# Patient Record
Sex: Male | Born: 1960 | Race: White | Hispanic: No | Marital: Married | Smoking: Former smoker
Health system: Southern US, Community
[De-identification: ages and names within clinical notes are randomized; demographics above are authoritative.]

## PROBLEM LIST (undated history)

## (undated) DIAGNOSIS — I2510 Atherosclerotic heart disease of native coronary artery without angina pectoris: Secondary | ICD-10-CM

## (undated) DIAGNOSIS — G4733 Obstructive sleep apnea (adult) (pediatric): Secondary | ICD-10-CM

## (undated) DIAGNOSIS — M1990 Unspecified osteoarthritis, unspecified site: Secondary | ICD-10-CM

## (undated) DIAGNOSIS — K219 Gastro-esophageal reflux disease without esophagitis: Secondary | ICD-10-CM

## (undated) DIAGNOSIS — G4730 Sleep apnea, unspecified: Secondary | ICD-10-CM

## (undated) DIAGNOSIS — I319 Disease of pericardium, unspecified: Secondary | ICD-10-CM

## (undated) DIAGNOSIS — I10 Essential (primary) hypertension: Secondary | ICD-10-CM

## (undated) DIAGNOSIS — R011 Cardiac murmur, unspecified: Secondary | ICD-10-CM

## (undated) DIAGNOSIS — E785 Hyperlipidemia, unspecified: Secondary | ICD-10-CM

## (undated) HISTORY — PX: COLONOSCOPY: SHX174

## (undated) HISTORY — DX: Essential (primary) hypertension: I10

## (undated) HISTORY — DX: Obstructive sleep apnea (adult) (pediatric): G47.33

## (undated) HISTORY — PX: FINGER SURGERY: SHX640

## (undated) HISTORY — DX: Cardiac murmur, unspecified: R01.1

## (undated) HISTORY — DX: Atherosclerotic heart disease of native coronary artery without angina pectoris: I25.10

## (undated) HISTORY — DX: Unspecified osteoarthritis, unspecified site: M19.90

## (undated) HISTORY — PX: OTHER SURGICAL HISTORY: SHX169

## (undated) HISTORY — DX: Disease of pericardium, unspecified: I31.9

## (undated) HISTORY — DX: Hyperlipidemia, unspecified: E78.5

## (undated) HISTORY — DX: Sleep apnea, unspecified: G47.30

## (undated) HISTORY — PX: CARDIAC CATHETERIZATION: SHX172

---

## 2005-06-28 ENCOUNTER — Observation Stay (HOSPITAL_COMMUNITY): Admission: EM | Admit: 2005-06-28 | Discharge: 2005-06-29 | Payer: Self-pay | Admitting: Emergency Medicine

## 2009-10-09 ENCOUNTER — Inpatient Hospital Stay (HOSPITAL_COMMUNITY): Admission: AD | Admit: 2009-10-09 | Discharge: 2009-10-10 | Payer: Self-pay | Admitting: Cardiovascular Disease

## 2009-10-09 ENCOUNTER — Encounter: Payer: Self-pay | Admitting: Emergency Medicine

## 2009-10-09 ENCOUNTER — Ambulatory Visit: Payer: Self-pay | Admitting: Diagnostic Radiology

## 2009-10-24 ENCOUNTER — Encounter: Admission: RE | Admit: 2009-10-24 | Discharge: 2009-10-24 | Payer: Self-pay | Admitting: Orthopedic Surgery

## 2010-04-15 HISTORY — PX: UPPER GASTROINTESTINAL ENDOSCOPY: SHX188

## 2010-05-06 ENCOUNTER — Encounter: Payer: Self-pay | Admitting: Orthopedic Surgery

## 2010-06-15 DIAGNOSIS — G4733 Obstructive sleep apnea (adult) (pediatric): Secondary | ICD-10-CM | POA: Insufficient documentation

## 2010-06-15 DIAGNOSIS — I10 Essential (primary) hypertension: Secondary | ICD-10-CM | POA: Insufficient documentation

## 2010-06-15 DIAGNOSIS — I2510 Atherosclerotic heart disease of native coronary artery without angina pectoris: Secondary | ICD-10-CM | POA: Insufficient documentation

## 2010-06-15 DIAGNOSIS — E785 Hyperlipidemia, unspecified: Secondary | ICD-10-CM | POA: Insufficient documentation

## 2010-06-15 LAB — CBC AND DIFFERENTIAL
HCT: 42 % (ref 41–53)
Hemoglobin: 14.2 g/dL (ref 13.5–17.5)

## 2010-06-15 LAB — HEPATIC FUNCTION PANEL: Alkaline Phosphatase: 78 U/L (ref 25–125)

## 2010-06-15 LAB — BASIC METABOLIC PANEL
Glucose: 77 mg/dL
Potassium: 4.5 mmol/L (ref 3.4–5.3)
Sodium: 139 mmol/L (ref 137–147)

## 2010-06-15 LAB — LIPID PANEL: Cholesterol: 198 mg/dL (ref 0–200)

## 2010-06-15 LAB — TSH: TSH: 1.26 u[IU]/mL (ref <=5.90)

## 2010-06-21 ENCOUNTER — Encounter: Payer: Self-pay | Admitting: Gastroenterology

## 2010-06-26 NOTE — Letter (Signed)
Summary: New Patient letter  [facility]  [address] [place], [place] [address]   Phone: [phone]  Fax: [phone]       06/21/2010 MRN: [id number]  Gregg Morgan [address] [place], [place]  [address]  Dear Gregg Morgan,  Welcome to the Gastroenterology Division at [facility].    You are scheduled to see Dr.  Arlyce Dice on 07-30-10 at 3:30pm on the 3rd floor at [facility], [address]  We ask that you try to arrive at our office 15 minutes prior to your appointment time to allow for check-in.  We would like you to complete the enclosed self-administered evaluation form prior to your visit and bring it with you on the day of your appointment.  We will review it with you.  Also, please bring a complete list of all your medications or, if you prefer, bring the medication bottles and we will list them.  Please bring your insurance card so that we may make a copy of it.  If your insurance requires a referral to see a specialist, please bring your referral form from your primary care physician.  Co-payments are due at the time of your visit and may be paid by cash, check or credit card.     Your office visit will consist of a consult with your physician (includes a physical exam), any laboratory testing he/she may order, scheduling of any necessary diagnostic testing (e.g. x-ray, ultrasound, CT-scan), and scheduling of a procedure (e.g. Endoscopy, Colonoscopy) if required.  Please allow enough time on your schedule to allow for any/all of these possibilities.    If you cannot keep your appointment, please call [phone] to cancel or reschedule prior to your appointment date.  This allows [place] the opportunity to schedule an appointment for another patient in need of care.  If you do not cancel or reschedule by 5 p.m. the business day prior to your appointment date, you will be charged a $50.00 late cancellation/no-show fee.    Thank you for choosing  [facility] for your medical needs.  We appreciate the opportunity to care for you.  Please visit [place] at our website  to learn more about our practice.                     Sincerely,                                                             The Gastroenterology Division

## 2010-07-01 LAB — CBC
MCH: 30.8 pg (ref 26.0–34.0)
MCH: 31.7 pg (ref 26.0–34.0)
MCHC: 33.7 g/dL (ref 30.0–36.0)
MCV: 91.5 fL (ref 78.0–100.0)
Platelets: 166 10*3/uL (ref 150–400)
Platelets: 212 10*3/uL (ref 150–400)
RBC: 4.63 MIL/uL (ref 4.22–5.81)
RDW: 12.9 % (ref 11.5–15.5)
WBC: 11.9 10*3/uL — ABNORMAL HIGH (ref 4.0–10.5)
WBC: 7.1 10*3/uL (ref 4.0–10.5)

## 2010-07-01 LAB — BASIC METABOLIC PANEL
CO2: 29 mEq/L (ref 19–32)
Calcium: 8.8 mg/dL (ref 8.4–10.5)
Chloride: 104 mEq/L (ref 96–112)
Creatinine, Ser: 0.97 mg/dL (ref 0.4–1.5)
Glucose, Bld: 97 mg/dL (ref 70–99)
Potassium: 4.2 mEq/L (ref 3.5–5.1)
Sodium: 140 mEq/L (ref 135–145)

## 2010-07-01 LAB — LIPID PANEL
Cholesterol: 293 mg/dL — ABNORMAL HIGH (ref 0–200)
LDL Cholesterol: 221 mg/dL — ABNORMAL HIGH (ref 0–99)
Total CHOL/HDL Ratio: 5.2 RATIO
Triglycerides: 82 mg/dL (ref <150)
VLDL: 16 mg/dL (ref 0–40)

## 2010-07-01 LAB — COMPREHENSIVE METABOLIC PANEL
Alkaline Phosphatase: 100 U/L (ref 39–117)
CO2: 26 mEq/L (ref 19–32)
Calcium: 9.2 mg/dL (ref 8.4–10.5)
Chloride: 105 mEq/L (ref 96–112)
Glucose, Bld: 127 mg/dL — ABNORMAL HIGH (ref 70–99)
Potassium: 3.8 mEq/L (ref 3.5–5.1)

## 2010-07-01 LAB — DIFFERENTIAL
Basophils Absolute: 0 10*3/uL (ref 0.0–0.1)
Basophils Relative: 0 % (ref 0–1)
Eosinophils Absolute: 0.1 10*3/uL (ref 0.0–0.7)
Eosinophils Relative: 1 % (ref 0–5)
Monocytes Absolute: 1.1 10*3/uL — ABNORMAL HIGH (ref 0.1–1.0)
Monocytes Relative: 9 % (ref 3–12)
Neutrophils Relative %: 78 % — ABNORMAL HIGH (ref 43–77)

## 2010-07-01 LAB — CARDIAC PANEL(CRET KIN+CKTOT+MB+TROPI)
CK, MB: 0.7 ng/mL (ref 0.3–4.0)
Total CK: 58 U/L (ref 7–232)
Troponin I: 0.03 ng/mL (ref 0.00–0.06)

## 2010-07-01 LAB — URINALYSIS, ROUTINE W REFLEX MICROSCOPIC
Bilirubin Urine: NEGATIVE
Glucose, UA: NEGATIVE mg/dL
Ketones, ur: NEGATIVE mg/dL
Nitrite: NEGATIVE
Nitrite: NEGATIVE
Protein, ur: NEGATIVE mg/dL
Protein, ur: NEGATIVE mg/dL
Specific Gravity, Urine: 1.017 (ref 1.005–1.030)
Specific Gravity, Urine: 1.023 (ref 1.005–1.030)
Urobilinogen, UA: 0.2 mg/dL (ref 0.0–1.0)

## 2010-07-01 LAB — HEPARIN LEVEL (UNFRACTIONATED): Heparin Unfractionated: 0.33 IU/mL (ref 0.30–0.70)

## 2010-07-01 LAB — GLUCOSE, CAPILLARY: Glucose-Capillary: 99 mg/dL (ref 70–99)

## 2010-07-01 LAB — POCT CARDIAC MARKERS: Myoglobin, poc: 42.8 ng/mL (ref 12–200)

## 2010-07-01 LAB — TSH: TSH: 1.677 u[IU]/mL (ref 0.350–4.500)

## 2010-07-01 LAB — D-DIMER, QUANTITATIVE: D-Dimer, Quant: <0.22 ug/mL-FEU (ref 0.00–0.48)

## 2010-07-01 LAB — MAGNESIUM: Magnesium: 1.8 mg/dL (ref 1.5–2.5)

## 2010-07-01 LAB — MRSA PCR SCREENING: MRSA by PCR: POSITIVE — AB

## 2010-07-01 LAB — PROTIME-INR: Prothrombin Time: 13.1 seconds (ref 11.6–15.2)

## 2010-07-01 LAB — HEMOGLOBIN A1C: Mean Plasma Glucose: 114 mg/dL (ref <117)

## 2010-07-03 DIAGNOSIS — I2510 Atherosclerotic heart disease of native coronary artery without angina pectoris: Secondary | ICD-10-CM

## 2010-07-03 DIAGNOSIS — I10 Essential (primary) hypertension: Secondary | ICD-10-CM

## 2010-07-03 DIAGNOSIS — G4733 Obstructive sleep apnea (adult) (pediatric): Secondary | ICD-10-CM

## 2010-07-03 DIAGNOSIS — E785 Hyperlipidemia, unspecified: Secondary | ICD-10-CM

## 2010-07-30 ENCOUNTER — Ambulatory Visit (INDEPENDENT_AMBULATORY_CARE_PROVIDER_SITE_OTHER): Payer: BC Managed Care – PPO | Admitting: Gastroenterology

## 2010-07-30 ENCOUNTER — Encounter: Payer: Self-pay | Admitting: Gastroenterology

## 2010-07-30 VITALS — BP 132/76 | HR 84 | Ht 69.0 in | Wt 187.0 lb

## 2010-07-30 DIAGNOSIS — R195 Other fecal abnormalities: Secondary | ICD-10-CM | POA: Insufficient documentation

## 2010-07-30 NOTE — Assessment & Plan Note (Addendum)
Occult GI bleeding could be related to active peptic disease. Patient did have reflux symptoms and dysphagia raising the question of an esophageal stricture. The lower colonic or small bowel bleeding source is also a consideration.  Medications #1 review prior CBC. #2 upper endoscopy with dilatation as indicated. #3 if endoscopy is negative for any GI bleeding source, then I will obtain followup Hemoccults

## 2010-07-30 NOTE — Patient Instructions (Addendum)
Cc. Donal Moore,MD Your EGD is scheduled on 08/16/2010 at 4pm Upper GI Endoscopy Upper GI endoscopy means using a flexible scope to look at the esophagus, stomach and upper small bowel. This is done to make a diagnosis in people with heartburn, abdominal pain, or abnormal bleeding. Sometimes an endoscope is needed to remove foreign bodies or food that become stuck in the esophagus; it can also be used to take biopsy samples. For the best results, do not eat or drink for 8 hours before having your upper endoscopy.  To perform the endoscopy, you will probably be sedated and your throat will be numbed with a special spray. The endoscope is then slowly passed down your throat (this will not interfere with your breathing). An endoscopy exam takes 15-30 minutes to complete and there is no real pain. Patients rarely remember much about the procedure. The results of the test may take several days if a biopsy or other test is taken.  You may have a sore throat after an endoscopy exam. Serious complications are very rare. Stick to liquids and soft foods until your pain is better. You should not drive a car or operate any dangerous equipment for at least 24 hours after being sedated. SEEK IMMEDIATE MEDICAL CARE IF:  You have severe throat pain.   You have shortness of breath.   You have bleeding problems.   You have a fever.   You have difficulty recovering from your sedation.  Document Released: 05/09/2004 Document Re-Released: 06/26/2009 [facility] Patient Information 2011 [place], [place].

## 2010-07-30 NOTE — Progress Notes (Signed)
History of Present Illness:  Mr. Zentner is a pleasant, 50 year old white male referred at the request of Dr. Modesto Charon for evaluation of Hemoccult-positive stool. This was noted on routine testing. He has no GI complaints, including change of bowel habits, melena, or hematochezia. Until last month he was complaining of pyrosis and dysphagia to solids. He would wake up at night coughing and regurgitating gastric contents. All the symptoms resolved after starting omeprazole. He is on no gastric irritants, including nonsteroidals.  He apparently underwent colonoscopy about 5 years ago. That was negative.  The patient has sleep apnea and coronary artery disease.    Review of Systems: Pertinent positive and negative review of systems were noted in the above HPI section. All other review of systems were otherwise negative.    Current Medications, Allergies, Past Medical History, Past Surgical History, Family History and Social History were reviewed in [employer] electronic medical record  Vital signs were reviewed in today's medical record. Physical Exam: General: Well developed , well nourished, no acute distress Head: Normocephalic and atraumatic Eyes:  sclerae anicteric, EOMI Ears: Normal auditory acuity Mouth: No deformity or lesions Lungs: Clear throughout to auscultation Heart: Regular rate and rhythm; no murmurs, rubs or bruits Abdomen: Soft, non tender and non distended. No masses, hepatosplenomegaly or hernias noted. Normal Bowel sounds Rectal:deferred Musculoskeletal: Symmetrical with no gross deformities  Pulses:  Normal pulses noted Extremities: No clubbing, cyanosis, edema or deformities noted Neurological: Alert oriented x 4, grossly nonfocal Psychological:  Alert and cooperative. Normal mood and affect

## 2010-07-31 ENCOUNTER — Encounter: Payer: Self-pay | Admitting: Gastroenterology

## 2010-08-15 ENCOUNTER — Encounter: Payer: Self-pay | Admitting: Gastroenterology

## 2010-08-16 ENCOUNTER — Encounter: Payer: Self-pay | Admitting: Gastroenterology

## 2010-08-16 ENCOUNTER — Ambulatory Visit (AMBULATORY_SURGERY_CENTER): Payer: BC Managed Care – PPO | Admitting: Gastroenterology

## 2010-08-16 DIAGNOSIS — K921 Melena: Secondary | ICD-10-CM

## 2010-08-16 DIAGNOSIS — K222 Esophageal obstruction: Secondary | ICD-10-CM

## 2010-08-16 DIAGNOSIS — R195 Other fecal abnormalities: Secondary | ICD-10-CM

## 2010-08-16 DIAGNOSIS — K449 Diaphragmatic hernia without obstruction or gangrene: Secondary | ICD-10-CM

## 2010-08-16 MED ORDER — SODIUM CHLORIDE 0.9 % IV SOLN: 500.0000 mL | INTRAVENOUS | Status: DC

## 2010-08-16 NOTE — Patient Instructions (Signed)
DISCHARGE INSTRUCTIONS FOR ESOPHAGEAL DILATATION DIET FOR TODAY REVIEWED WITH PT & CAREPARTNER . INFORMATION ON HIATAL HERNIA ALSO GIVEN TO PT & CAREPARTNER.

## 2010-08-17 ENCOUNTER — Telehealth: Payer: Self-pay | Admitting: *Deleted

## 2010-08-17 NOTE — Telephone Encounter (Signed)

## 2010-08-31 NOTE — Op Note (Signed)
NAMELEANARD, DIMAIO NO.:  [id number]   MEDICAL RECORD NO.:  [id number]          PATIENT TYPE:  EMS   LOCATION:  ED                           FACILITY:  [facility]   PHYSICIAN:  Dionne Ano. Gramig III, M.D.DATE OF BIRTH:  02/17/1961   DATE OF PROCEDURE:  06/28/2005  DATE OF DISCHARGE:                                 OPERATIVE REPORT   PREOPERATIVE DIAGNOSIS:  Left index finger amputation.   POSTOPERATIVE DIAGNOSIS:  Left index finger amputation.   PROCEDURE:  1.  I&D open fracture left index finger about an amputation site including      the skin and subcutaneous tissue, bone and tendinous structures.  2.  Bilateral neurectomies, left index finger.  3.  Volar advancement flap left index finger.   SURGEON:  Dionne Ano. Amanda Pea, M.D.   ASSISTANT:  Karie Chimera, P.A.-C.   COMPLICATIONS:  None.   ANESTHESIA:  Intermetacarpal block with IV sedation.   TOURNIQUET TIME:  Zero.   COMPLICATIONS:  None.   ESTIMATED BLOOD LOSS:  Minimal.   INDICATIONS FOR PROCEDURE:  Mr. Sirius Woodford presents for evaluation and  treatment of the above mentioned injury. The patient was at work today and  sustained an on the job injury. The patient had his left index finger caught  in the machine and sustained the amputation. The amputation distally is  unsuitable for replantation and I have discussed this with the patient. He  has been prepared with I&D, revision, amputation, volar flap, neurectomies,  etc. He understands the risks and benefits and desires to proceed.   DESCRIPTION OF PROCEDURE:  The patient was seen by myself and anesthesia and  taken to the operative suite, underwent smooth induction of IV sedation  followed by intermetacarpal block performed by myself. He was then prepped  and draped in the usual sterile fashion with Betadine scrub and paint.  Following securing a sterile field, he underwent I&D of skin and  subcutaneous tissue, tendon, bone, and nail bed  tissue. Excisional  debridement of these structures was accomplished without difficulty removing  debris and charred material about the tip. I debrided him to a fresh base  and following this, I performed I&D with greater than 3 liters of water.  Once this was done, new drapes were placed and the patient then underwent  bilateral neurectomies about the left index finger followed by a volar  advancement flap pulling the volar skin up and suturing it to the nail bed.  The flap had a good refill. He did sustain a significant amount of trauma  and thus will allow the distal volar flap to declare itself but certainly at  the time of inset the patient appeared to be stable and viable. I trimmed  the dog ears medially and laterally for shaping purposes and secured them  with Chromic. Following this, he was irrigated copiously once again followed  by placement of Adaptic under the eponychial fold, sterile dressing and a  splint. He tolerated the procedure well. There were no complicating  features. All sponge, needle and instrument counts were reported as correct.  Following  this, the patient then underwent drape removal and was taken to  the recovery room where he will be started on IV antibiotics, pain  management according to his needs and will watch him closely in the  postoperative period. I have discussed with him the do's and don't's and  etc. and all questions have been encouraged and answered. It was a pleasure  to participate in his care and we look forward to participating in his  postoperative recovery.           ______________________________  Dionne Ano. Everlene Other, M.D.     Nash Mantis  D:  06/28/2005  T:  07/01/2005  Job:  [id number]

## 2011-08-01 ENCOUNTER — Encounter (HOSPITAL_COMMUNITY): Payer: Self-pay

## 2011-08-01 ENCOUNTER — Encounter (HOSPITAL_COMMUNITY)
Admission: RE | Admit: 2011-08-01 | Discharge: 2011-08-01 | Disposition: A | Discharge status: Home / Self Care | Payer: BC Managed Care – PPO | Source: Ambulatory Visit | Attending: Anesthesiology | Admitting: Anesthesiology

## 2011-08-01 ENCOUNTER — Encounter (HOSPITAL_COMMUNITY)
Admission: RE | Admit: 2011-08-01 | Discharge: 2011-08-01 | Disposition: A | Discharge status: Home / Self Care | Payer: BC Managed Care – PPO | Source: Ambulatory Visit | Attending: Orthopedic Surgery | Admitting: Orthopedic Surgery

## 2011-08-01 HISTORY — DX: Gastro-esophageal reflux disease without esophagitis: K21.9

## 2011-08-01 LAB — CBC
HCT: 41.9 % (ref 39.0–52.0)
MCH: 31.7 pg (ref 26.0–34.0)
MCHC: 35.1 g/dL (ref 30.0–36.0)
MCV: 90.3 fL (ref 78.0–100.0)
Platelets: 224 10*3/uL (ref 150–400)
RDW: 12.5 % (ref 11.5–15.5)
WBC: 5.5 10*3/uL (ref 4.0–10.5)

## 2011-08-01 LAB — BASIC METABOLIC PANEL
BUN: 12 mg/dL (ref 6–23)
CO2: 28 mEq/L (ref 19–32)
Calcium: 9.5 mg/dL (ref 8.4–10.5)
Creatinine, Ser: 0.91 mg/dL (ref 0.50–1.35)
Glucose, Bld: 114 mg/dL — ABNORMAL HIGH (ref 70–99)

## 2011-08-01 LAB — SURGICAL PCR SCREEN: Staphylococcus aureus: POSITIVE — AB

## 2011-08-01 NOTE — Progress Notes (Addendum)
Thursday...Marland KitchenMarland KitchenMarland Kitchen PATIENT WAS HAVING SOME CHEST DISCOMFORT, 6./2011, WHILE AT WORK, WENT HOME AND WIFE TOOK HIM TO EMERGENCY CARE, WHO THEN SENT HIM TO [place]...WENT FOR CARDIAC CATH---HAD 50-60% BLOCKAGE.Marland KitchenMEDICAL TREATMENT ONLY...  HAVE REQUESTED FROM [facility], HIS LAST OFFICE NOTE, EKG, SLEEP  STUDY...NO RECENT CHEST PAINS OR PROBLEMS SINCE HIS CATH IN 2011   NO ORDERS IN EPIC AS OF 1630 THURSDAY

## 2011-08-01 NOTE — Pre-Procedure Instructions (Signed)
20 Jemari Hallum Pelto   08/01/2011   Your procedure is scheduled on:  Sunday, April 20TH  Report to [facility] at  6:00 AM.  Call this number if you have problems the morning of surgery: [phone]   Remember:   Do not eat food:After Midnight  Friday   May have clear liquids: up to 4 Hours before arrival time  4:00 AM.  Clear liquids include soda, tea, black coffee, apple or grape juice, broth.   Take these medicines the morning of surgery with A SIP OF WATER: omeprazole   Do not wear jewelry, make-up or nail polish.   Do not wear lotions, powders, or perfumes. You may wear deodorant.  Do not shave 48 hours prior to surgery.   Do not bring valuables to the hospital.  Contacts, dentures or bridgework may not be worn into surgery.  Leave suitcase in the car. After surgery it may be brought to your room.  For patients admitted to the hospital, checkout time is 11:00 AM the day of discharge.   Patients discharged the day of surgery will not be allowed to drive home.  Name and phone number of your driver: MICHELLE    Special Instructions: CHG Shower Use Special Wash: 1/2 bottle night before surgery and 1/2 bottle morning of surgery.   Please read over the following fact sheets that you were given: Pain Booklet, MRSA Information and Surgical Site Infection Prevention

## 2011-08-01 NOTE — Progress Notes (Signed)
1530  Thursday....the patient, BACK IN 2011 HAD SOME CHEST PRESSURE (ALL DURING THE DAY--WORKED) WIFE BROUGHT HIM TO EMERGENCY FACILITIES IN [place]....WAS TRANSPORTED HIM FROM THERE TO [place]....WAS SEEN BY DR Erlene Quan...HAD CARDIAC CATH...WHICH SHOWED A 50-60% BLOCKAGE.Marland KitchenNO INTERVENTION..WAS MEDICAL TREATMENT ONLY....

## 2011-08-02 NOTE — Progress Notes (Signed)
EKG AND OFFICE NOTE SHOWN TO ALLISON ZELENAK PA , REQUESTED STRESS TEST AND ECHO FROM [facility].

## 2011-08-02 NOTE — Progress Notes (Addendum)
Faxed request to [facility] medical records with urgent on fax form requesting sleep study, EKG and office note. Received confirmation fax went through. Had spoken with medical records and they requested we fax request.   Called Dr. Glenna Durand office to let them know no orders in EPIC. They stated they would let him know.

## 2011-08-02 NOTE — H&P (Signed)
Gregg Morgan is an 51 y.o. male.   Chief Complaint: Right thumb fracture  HPI: Pt sustained closed fracture to thumb Pt seen/examined in office. Pt recommended to undergo surgery to correct joint alignment of thumb   Past Medical History  Diagnosis Date  . Hypertension   . Hyperlipidemia   . Coronary artery disease   . Obstructive sleep apnea   . GERD (gastroesophageal reflux disease)     Past Surgical History  Procedure Date  . None   . Finger surgery   . Cardiac catheterization     09/2009--DR  BERRY    Family History  Problem Relation Age of Onset  . Colon cancer Father    Social History:  reports that he quit smoking about 22 months ago. He has never used smokeless tobacco. He reports that he drinks about 1.2 ounces of alcohol per week. He reports that he does not use illicit drugs.  Allergies: No Known Allergies  Medications Prior to Admission  Medication Dose Route Frequency Provider Last Rate Last Dose  . 0.9 %  sodium chloride infusion  500 mL Intravenous Continuous Louis Meckel, MD       Medications Prior to Admission  Medication Sig Dispense Refill  . aspirin 81 MG tablet Take 81 mg by mouth daily.        Marland Kitchen atorvastatin (LIPITOR) 40 MG tablet Take 1-1/2 pills per day       . Cholecalciferol (VITAMIN D) 2000 UNITS CAPS Take by mouth 1 dose over 46 hours.        . fish oil-omega-3 fatty acids 1000 MG capsule Take 1,000 mg by mouth daily.        Marland Kitchen lisinopril (PRINIVIL,ZESTRIL) 20 MG tablet Take 20 mg by mouth daily.        Marland Kitchen omeprazole (PRILOSEC) 20 MG capsule Take 20 mg by mouth daily.          Results for orders placed during the hospital encounter of 08/01/11 (from the past 48 hour(s))  SURGICAL PCR SCREEN     Status: Abnormal   Collection Time   08/01/11  4:04 PM      Component Value Range Comment   MRSA, PCR POSITIVE (*) NEGATIVE     Staphylococcus aureus POSITIVE (*) NEGATIVE    BASIC METABOLIC PANEL     Status: Abnormal   Collection Time   08/01/11  4:05 PM      Component Value Range Comment   Sodium 138  135 - 145 (mEq/L)    Potassium 4.6  3.5 - 5.1 (mEq/L)    Chloride 103  96 - 112 (mEq/L)    CO2 28  19 - 32 (mEq/L)    Glucose, Bld 114 (*) 70 - 99 (mg/dL)    BUN 12  6 - 23 (mg/dL)    Creatinine, Ser 1.61  0.50 - 1.35 (mg/dL)    Calcium 9.5  8.4 - 10.5 (mg/dL)    GFR calc non Af Amer >90  >90 (mL/min)    GFR calc Af Amer >90  >90 (mL/min)   CBC     Status: Normal   Collection Time   08/01/11  4:05 PM      Component Value Range Comment   WBC 5.5  4.0 - 10.5 (K/uL)    RBC 4.64  4.22 - 5.81 (MIL/uL)    Hemoglobin 14.7  13.0 - 17.0 (g/dL)    HCT 09.6  04.5 - 40.9 (%)    MCV 90.3  78.0 -  100.0 (fL)    MCH 31.7  26.0 - 34.0 (pg)    MCHC 35.1  30.0 - 36.0 (g/dL)    RDW 16.1  09.6 - 04.5 (%)    Platelets 224  150 - 400 (K/uL)    Dg Chest 2 View  08/01/2011  *RADIOLOGY REPORT*  Clinical Data: Preop for right thumb surgery  CHEST - 2 VIEW  Comparison: 10/09/2009  Findings: Cardiomediastinal silhouette is stable.  No acute infiltrate or pleural effusion.  No pulmonary edema.  Bony thorax is stable.  IMPRESSION: No active disease.  No significant change.  Original Report Authenticated By: Natasha Mead, M.D.    No recent illnesses or hospitalizations  General Appearance:  Alert, cooperative, no distress, appears stated age  Head:  Normocephalic, without obvious abnormality, atraumatic  Eyes:  Pupils equal, conjunctiva/corneas clear,         Throat: Lips, mucosa, and tongue normal; teeth and gums normal  Neck: No visible masses     Lungs:   respirations unlabored  Chest Wall:  No tenderness or deformity  Heart:  Regular rate and rhythm,  Abdomen:   Soft, non-tender,         Extremities: Right thumb: moderate swelling limited mobility, skin intact fingers warm well perfused Good digital mobility   Pulses: 2+ and symmetric  Skin: Skin color, texture, turgor normal, no rashes or lesions     Neurologic: Normal     Assessment/Plan Right thumb intraarticular proximal phalanx fracture/displaced  Right thumb open reduction and internal fixation to restore joint congruity  R/B/A DISCUSSED WITH PT IN OFFICE.  PT VOICED UNDERSTANDING OF PLAN CONSENT SIGNED DAY OF SURGERY PT SEEN AND EXAMINED PRIOR TO OPERATIVE PROCEDURE/DAY OF SURGERY SITE MARKED. QUESTIONS ANSWERED WILL [facility] FOLLOWING SURGERY  Sharma Covert 08/02/2011, 6:28 PM

## 2011-08-03 ENCOUNTER — Ambulatory Visit (HOSPITAL_COMMUNITY)
Admission: RE | Admit: 2011-08-03 | Discharge: 2011-08-03 | Disposition: A | Discharge status: Home / Self Care | Payer: BC Managed Care – PPO | Source: Ambulatory Visit | Attending: Orthopedic Surgery | Admitting: Orthopedic Surgery

## 2011-08-03 ENCOUNTER — Encounter (HOSPITAL_COMMUNITY)
Admission: RE | Disposition: A | Discharge status: Home / Self Care | Payer: Self-pay | Source: Ambulatory Visit | Attending: Orthopedic Surgery

## 2011-08-03 ENCOUNTER — Encounter (HOSPITAL_COMMUNITY): Payer: Self-pay | Admitting: Anesthesiology

## 2011-08-03 ENCOUNTER — Ambulatory Visit (HOSPITAL_COMMUNITY): Payer: BC Managed Care – PPO | Admitting: Anesthesiology

## 2011-08-03 ENCOUNTER — Encounter (HOSPITAL_COMMUNITY): Payer: Self-pay | Admitting: *Deleted

## 2011-08-03 DIAGNOSIS — K219 Gastro-esophageal reflux disease without esophagitis: Secondary | ICD-10-CM | POA: Insufficient documentation

## 2011-08-03 DIAGNOSIS — Z87891 Personal history of nicotine dependence: Secondary | ICD-10-CM | POA: Insufficient documentation

## 2011-08-03 DIAGNOSIS — I10 Essential (primary) hypertension: Secondary | ICD-10-CM | POA: Insufficient documentation

## 2011-08-03 DIAGNOSIS — I2510 Atherosclerotic heart disease of native coronary artery without angina pectoris: Secondary | ICD-10-CM | POA: Insufficient documentation

## 2011-08-03 DIAGNOSIS — G4730 Sleep apnea, unspecified: Secondary | ICD-10-CM | POA: Insufficient documentation

## 2011-08-03 DIAGNOSIS — IMO0002 Reserved for concepts with insufficient information to code with codable children: Secondary | ICD-10-CM | POA: Insufficient documentation

## 2011-08-03 DIAGNOSIS — Z01812 Encounter for preprocedural laboratory examination: Secondary | ICD-10-CM | POA: Insufficient documentation

## 2011-08-03 DIAGNOSIS — X58XXXA Exposure to other specified factors, initial encounter: Secondary | ICD-10-CM | POA: Insufficient documentation

## 2011-08-03 HISTORY — PX: ORIF FINGER FRACTURE: SHX2122

## 2011-08-03 SURGERY — OPEN REDUCTION INTERNAL FIXATION (ORIF) METACARPAL (FINGER) FRACTURE
Anesthesia: General | Laterality: Right | Wound class: Clean

## 2011-08-03 MED ORDER — VITAMIN C 500 MG PO TABS: 500.0000 mg | ORAL_TABLET | Freq: Every day | ORAL | Status: AC

## 2011-08-03 MED ORDER — 0.9 % SODIUM CHLORIDE (POUR BTL) OPTIME
TOPICAL | Status: DC | PRN
  Administered 2011-08-03: 1000 mL

## 2011-08-03 MED ORDER — PROPOFOL 10 MG/ML IV EMUL
INTRAVENOUS | Status: DC | PRN
  Administered 2011-08-03: 50 mg via INTRAVENOUS
  Administered 2011-08-03: 100 mg via INTRAVENOUS

## 2011-08-03 MED ORDER — ONDANSETRON HCL 4 MG/2ML IJ SOLN: 4.0000 mg | Freq: Four times a day (QID) | INTRAMUSCULAR | Status: DC | PRN

## 2011-08-03 MED ORDER — CHLORHEXIDINE GLUCONATE 4 % EX LIQD: 60.0000 mL | Freq: Once | CUTANEOUS | Status: DC

## 2011-08-03 MED ORDER — CEFAZOLIN SODIUM-DEXTROSE 2-3 GM-% IV SOLR
INTRAVENOUS | Status: AC
  Administered 2011-08-03: 2 g via INTRAVENOUS
  Filled 2011-08-03: qty 50

## 2011-08-03 MED ORDER — OXYCODONE-ACETAMINOPHEN 10-325 MG PO TABS: 1.0000 | ORAL_TABLET | ORAL | Status: AC | PRN

## 2011-08-03 MED ORDER — LACTATED RINGERS IV SOLN
INTRAVENOUS | Status: DC | PRN
  Administered 2011-08-03 (×2): via INTRAVENOUS

## 2011-08-03 MED ORDER — EPHEDRINE SULFATE 50 MG/ML IJ SOLN
INTRAMUSCULAR | Status: DC | PRN
  Administered 2011-08-03: 10 mg via INTRAVENOUS

## 2011-08-03 MED ORDER — BUPIVACAINE HCL (PF) 0.25 % IJ SOLN
INTRAMUSCULAR | Status: DC | PRN
  Administered 2011-08-03: 10 mL

## 2011-08-03 MED ORDER — LIDOCAINE HCL (CARDIAC) 20 MG/ML IV SOLN
INTRAVENOUS | Status: DC | PRN
  Administered 2011-08-03: 80 mg via INTRAVENOUS

## 2011-08-03 MED ORDER — CEFAZOLIN SODIUM 1-5 GM-% IV SOLN: 1.0000 g | INTRAVENOUS | Status: DC

## 2011-08-03 MED ORDER — FENTANYL CITRATE 0.05 MG/ML IJ SOLN: 25.0000 ug | INTRAMUSCULAR | Status: DC | PRN

## 2011-08-03 MED ORDER — CEFAZOLIN SODIUM-DEXTROSE 2-3 GM-% IV SOLR: 2.0000 g | Freq: Once | INTRAVENOUS | Status: DC

## 2011-08-03 MED ORDER — DOCUSATE SODIUM 100 MG PO CAPS: 100.0000 mg | ORAL_CAPSULE | Freq: Two times a day (BID) | ORAL | Status: AC

## 2011-08-03 MED ORDER — MIDAZOLAM HCL 5 MG/5ML IJ SOLN
INTRAMUSCULAR | Status: DC | PRN
  Administered 2011-08-03: 2 mg via INTRAVENOUS

## 2011-08-03 MED ORDER — FENTANYL CITRATE 0.05 MG/ML IJ SOLN
INTRAMUSCULAR | Status: DC | PRN
  Administered 2011-08-03: 50 ug via INTRAVENOUS
  Administered 2011-08-03: 100 ug via INTRAVENOUS

## 2011-08-03 SURGICAL SUPPLY — 64 items
BANDAGE ELASTIC 3 VELCRO ST LF (GAUZE/BANDAGES/DRESSINGS) ×1 IMPLANT
BANDAGE ELASTIC 4 VELCRO ST LF (GAUZE/BANDAGES/DRESSINGS) IMPLANT
BANDAGE GAUZE ELAST BULKY 4 IN (GAUZE/BANDAGES/DRESSINGS) IMPLANT
BIT DRILL 1.1 (BIT) ×2
BIT DRILL 1.1 MINI QC NONSTRL (BIT) ×1 IMPLANT
BIT DRILL 60X20X1.1XQC TMX (BIT) IMPLANT
BIT DRL 60X20X1.1XQC TMX (BIT) ×1
BNDG CMPR 9X4 STRL LF SNTH (GAUZE/BANDAGES/DRESSINGS) ×1
BNDG CMPR MD 5X2 ELC HKLP STRL (GAUZE/BANDAGES/DRESSINGS) ×1
BNDG COHESIVE 1X5 TAN STRL LF (GAUZE/BANDAGES/DRESSINGS) IMPLANT
BNDG ELASTIC 2 VLCR STRL LF (GAUZE/BANDAGES/DRESSINGS) ×2 IMPLANT
BNDG ESMARK 4X9 LF (GAUZE/BANDAGES/DRESSINGS) ×2 IMPLANT
CAP PIN ORTHO PINK (CAP) IMPLANT
CAP PIN PROTECTOR ORTHO WHT (CAP) IMPLANT
CLOTH BEACON ORANGE TIMEOUT ST (SAFETY) ×2 IMPLANT
CORDS BIPOLAR (ELECTRODE) ×2 IMPLANT
COVER SURGICAL LIGHT HANDLE (MISCELLANEOUS) ×2 IMPLANT
CUFF TOURNIQUET SINGLE 18IN (TOURNIQUET CUFF) ×2 IMPLANT
CUFF TOURNIQUET SINGLE 24IN (TOURNIQUET CUFF) IMPLANT
DRAPE OEC MINIVIEW 54X84 (DRAPES) ×1 IMPLANT
DRAPE SURG 17X23 STRL (DRAPES) ×2 IMPLANT
DRIVER BIT 1.5 (TRAUMA) ×1 IMPLANT
DRSG ADAPTIC 3X8 NADH LF (GAUZE/BANDAGES/DRESSINGS) IMPLANT
DRSG EMULSION OIL 3X3 NADH (GAUZE/BANDAGES/DRESSINGS) ×1 IMPLANT
GAUZE SPONGE 2X2 8PLY STRL LF (GAUZE/BANDAGES/DRESSINGS) IMPLANT
GLOVE BIOGEL PI IND STRL 8.5 (GLOVE) ×1 IMPLANT
GLOVE BIOGEL PI INDICATOR 8.5 (GLOVE) ×1
GLOVE SURG ORTHO 8.0 STRL STRW (GLOVE) ×2 IMPLANT
GOWN PREVENTION PLUS XLARGE (GOWN DISPOSABLE) ×2 IMPLANT
GOWN STRL NON-REIN LRG LVL3 (GOWN DISPOSABLE) ×4 IMPLANT
K-WIRE SMTH SNGL TROCAR .028X4 (WIRE)
KIT BASIN OR (CUSTOM PROCEDURE TRAY) ×2 IMPLANT
KIT ROOM TURNOVER OR (KITS) ×2 IMPLANT
KWIRE SMTH SNGL TROCAR .028X4 (WIRE) IMPLANT
MANIFOLD NEPTUNE II (INSTRUMENTS) ×2 IMPLANT
NDL HYPO 25GX1X1/2 BEV (NEEDLE) IMPLANT
NEEDLE HYPO 25GX1X1/2 BEV (NEEDLE) ×2 IMPLANT
NS IRRIG 1000ML POUR BTL (IV SOLUTION) ×2 IMPLANT
PACK ORTHO EXTREMITY (CUSTOM PROCEDURE TRAY) ×2 IMPLANT
PAD ARMBOARD 7.5X6 YLW CONV (MISCELLANEOUS) ×4 IMPLANT
PAD CAST 4YDX4 CTTN HI CHSV (CAST SUPPLIES) IMPLANT
PADDING CAST COTTON 4X4 STRL (CAST SUPPLIES)
PADDING UNDERCAST 2  STERILE (CAST SUPPLIES) ×2 IMPLANT
PLATE T SMALL 1.5MM (Plate) ×1 IMPLANT
SCREW 1.5X15MM (Screw) ×1 IMPLANT
SCREW LOCKING 1.5X10 (Screw) ×1 IMPLANT
SCREW NL 1.5X11 WRIST (Screw) ×1 IMPLANT
SCREW NL 1.5X13 (Screw) ×1 IMPLANT
SCREW NONIOC 1.5 14M (Screw) ×1 IMPLANT
SCREW NONIOC 1.5 16M (Screw) ×1 IMPLANT
SOAP 2 % CHG 4 OZ (WOUND CARE) ×2 IMPLANT
SPLINT FIBERGLASS 3X35 (CAST SUPPLIES) ×1 IMPLANT
SPONGE GAUZE 2X2 STER 10/PKG (GAUZE/BANDAGES/DRESSINGS)
SPONGE GAUZE 4X4 12PLY (GAUZE/BANDAGES/DRESSINGS) ×1 IMPLANT
SUCTION FRAZIER TIP 10 FR DISP (SUCTIONS) ×1 IMPLANT
SUT ETHIBOND 4 0 TF (SUTURE) ×1 IMPLANT
SUT MERSILENE 4 0 P 3 (SUTURE) IMPLANT
SUT PROLENE 4 0 PS 2 18 (SUTURE) ×1 IMPLANT
SYR CONTROL 10ML LL (SYRINGE) ×1 IMPLANT
TOWEL OR 17X24 6PK STRL BLUE (TOWEL DISPOSABLE) ×2 IMPLANT
TOWEL OR 17X26 10 PK STRL BLUE (TOWEL DISPOSABLE) ×2 IMPLANT
TUBE CONNECTING 12X1/4 (SUCTIONS) ×1 IMPLANT
UNDERPAD 30X30 INCONTINENT (UNDERPADS AND DIAPERS) ×2 IMPLANT
WATER STERILE IRR 1000ML POUR (IV SOLUTION) ×1 IMPLANT

## 2011-08-03 NOTE — Op Note (Signed)
NAMETEMITOPE, GRIFFING NO.:  [id number]  MEDICAL RECORD NO.:  [id number]  LOCATION:  [facility]                         FACILITY:  [facility]  PHYSICIAN:  Madelynn Done, MD  DATE OF BIRTH:  Jan 18, 1961  DATE OF PROCEDURE:  08/03/2011 DATE OF DISCHARGE:                              OPERATIVE REPORT   PREOPERATIVE DIAGNOSIS:  Right thumb proximal phalanx fracture involving articular surface of the interphalangeal  joint, displaced.  POSTOPERATIVE DIAGNOSIS:  Right thumb proximal phalanx fracture involving articular surface of the interphalangeal joint, displaced.  ATTENDING PHYSICIAN:  Madelynn Done, MD, who scrubbed and present for the entire procedure.  ASSISTANT SURGEON:  None.  ANESTHESIA:  General via LMA.  SURGICAL PROCEDURE: 1. Open treatment of right thumb proximal phalanx fracture involving     articular surface of the thumb interphalangeal joint. 2. Radiographs 2 views right thumb.  SURGICAL IMPLANTS:  A total of six 1.5 mm screws from the DePuy hand ALPS system with a small T-plate 1.5 mm.  SURGICAL INDICATIONS:  Mr. Gill is a right-hand-dominant gentleman, who sustained a closed injury to his right thumb involving displaced fracture to the articular surface of the thumb interphalangeal joint. The patient elects to undergo the above procedure.  Risks, benefits, and alternatives were discussed in detail with the patient and a signed informed consent was obtained.  Risks include, but not limited to bleeding, infection, damage to nearby nerves, arteries, tendons, loss of motion of elbow, wrist, and digits, and need for further surgical intervention.  DESCRIPTION OF PROCEDURE:  The patient was properly identified in the preoperative holding area and marked with a permanent marker made on the right thumb to indicate the correct operative site.  The patient was then brought back to the operating room and placed supine on the anesthesia room table  and general anesthesia was administered.  The patient tolerated this well.  A well-padded tourniquet was then placed in the right brachium and sealed with 1000 drape.  The right upper extremity was then prepped and draped in normal sterile fashion.  Time- out was called, the correct side was identified, and procedure then begun.  Attention was then turned to the right thumb, and the limb was then elevated using Esmarch exsanguination and tourniquet insufflated. Time-out was called.  Longitudinal incision made directly over the thumb proximal phalanx.  Dissection carried down through the skin and subcutaneous tissue.  The extensor mechanism was split longitudinally, and the fracture site was then exposed.  The patient had a very comminuted diaphyseal fracture with complete split of the ulnar and radial condyles with displacement in the articular margin.  Following this, attention was then turned to reduction of the articular margin, reduction place was then placed on the 2 condyles and held in place with good anatomical reduction.  Following this, 2 screws were then placed perpendicular to the fracture site with 1.5 mm screws engaging the opposite cortex keeping the condyles reduced.  Condyles were then reduced to the shaft and then held in place with a small T plate with 2 screws proximally and 2 screws distally.  This reduced the entire shaft well keeping all 3 large pieces nicely  reduced.  The wound was then thoroughly irrigated.  Final radiographs of the thumb were then obtained.  The extensor mechanism closed with 4-0 Ethibond suture and a figure-of-eight horizontal mattress sutures.  Skin was then closed with 4-0 Prolene, 10 mL of 0.25% Marcaine infiltrated locally.  Adaptic dressing, sterile compressive bandage then applied.  The patient tolerated the procedure well, was extubated and taken to recovery room in good condition after being placed in a well-padded thumb  spica splint.  Intraoperative radiographs 2 views of the thumb do show the internal fixation in place.  There was good position in both planes.  POSTOPERATIVE PLAN:  The patient was discharged home, seen back in the office in approximately 10 days for wound check, suture removal, x-rays, application, and then down to see our therapist for a small hand-based splint and begin a postoperative ORIF protocol of the thumb proximal phalanx.  Radiographs at each visit at the 1, 3, 5 and 9-week mark for followup.     Madelynn Done, MD     FWO/MEDQ  D:  08/03/2011  T:  08/03/2011  Job:  [id number]

## 2011-08-03 NOTE — Transfer of Care (Signed)
Immediate Anesthesia Transfer of Care Note  Patient: Gregg Morgan  Procedure(s) Performed: Procedure(s) (LRB): OPEN REDUCTION INTERNAL FIXATION (ORIF) METACARPAL (FINGER) FRACTURE (Right)  Patient Location: PACU  Anesthesia Type: General  Level of Consciousness: awake, alert , oriented and sedated  Airway & Oxygen Therapy: Patient Spontanous Breathing  Post-op Assessment: Report given to PACU RN and Patient moving all extremities  Post vital signs: Reviewed  Complications: No apparent anesthesia complications

## 2011-08-03 NOTE — Discharge Instructions (Signed)
KEEP BANDAGE CLEAN AND DRY CALL OFFICE FOR F/U APPT [phone] in 10 days KEEP HAND ELEVATED ABOVE HEART OK TO APPLY ICE TO OPERATIVE AREA CONTACT OFFICE IF ANY WORSENING PAIN OR CONCERNS. Dr. Melvyn Novas cell number [phone] call if questions or concerns

## 2011-08-03 NOTE — Brief Op Note (Signed)
08/03/2011  10:08 AM  PATIENT:  Gregg Morgan  51 y.o. male  PRE-OPERATIVE DIAGNOSIS:  RIGHT THUMB PROXIMAL PHALANX FRACTURE  POST-OPERATIVE DIAGNOSIS:  same  PROCEDURE:  Procedure(s) (LRB): OPEN REDUCTION INTERNAL FIXATION (ORIF) METACARPAL (FINGER) FRACTURE (Right)  SURGEON:  Surgeon(s) and Role:    * Sharma Covert, MD - Primary  PHYSICIAN ASSISTANT: non  ASSISTANTS: none   ANESTHESIA:   general  EBL:  Total I/O In: 1400 [I.V.:1400] Out: 10 [Blood:10]  BLOOD ADMINISTERED:none  DRAINS: none   LOCAL MEDICATIONS USED:  MARCAINE     SPECIMEN:  No Specimen  DISPOSITION OF SPECIMEN:  N/A  COUNTS:  YES  TOURNIQUET:   Total Tourniquet Time Documented: Upper Arm (Right) - 70 minutes  DICTATION: .Other Dictation: Dictation Number [id number]  PLAN OF CARE: Discharge to home after PACU  PATIENT DISPOSITION:  PACU - hemodynamically stable.   Delay start of Pharmacological VTE agent (>24hrs) due to surgical blood loss or risk of bleeding: not applicable

## 2011-08-03 NOTE — Preoperative (Signed)
Beta Blockers   Reason not to administer Beta Blockers:Not Applicable 

## 2011-08-03 NOTE — Anesthesia Preprocedure Evaluation (Signed)
Anesthesia Evaluation  Patient identified by MRN, date of birth, ID band Patient awake    Reviewed: Allergy & Precautions, H&P , NPO status , Patient's Chart, lab work & pertinent test results  Airway Mallampati: II  Neck ROM: full    Dental   Pulmonary sleep apnea , former smoker         Cardiovascular hypertension, + CAD     Neuro/Psych    GI/Hepatic GERD-  ,  Endo/Other    Renal/GU      Musculoskeletal   Abdominal   Peds  Hematology   Anesthesia Other Findings   Reproductive/Obstetrics                           Anesthesia Physical Anesthesia Plan  ASA: II  Anesthesia Plan: General   Post-op Pain Management:    Induction: Intravenous  Airway Management Planned: LMA  Additional Equipment:   Intra-op Plan:   Post-operative Plan:   Informed Consent: I have reviewed the patients History and Physical, chart, labs and discussed the procedure including the risks, benefits and alternatives for the proposed anesthesia with the patient or authorized representative who has indicated his/her understanding and acceptance.     Plan Discussed with: CRNA and Surgeon  Anesthesia Plan Comments:         Anesthesia Quick Evaluation

## 2011-08-03 NOTE — Anesthesia Postprocedure Evaluation (Signed)
Anesthesia Post Note  Patient: Gregg Morgan  Procedure(s) Performed: Procedure(s) (LRB): OPEN REDUCTION INTERNAL FIXATION (ORIF) METACARPAL (FINGER) FRACTURE (Right)  Anesthesia type: General  Patient location: PACU  Post pain: Pain level controlled and Adequate analgesia  Post assessment: Post-op Vital signs reviewed, Patient's Cardiovascular Status Stable, Respiratory Function Stable, Patent Airway and Pain level controlled  Last Vitals:  Filed Vitals:   08/03/11 1044  BP:   Pulse: 65  Temp:   Resp: 20    Post vital signs: Reviewed and stable  Level of consciousness: awake, alert  and oriented  Complications: No apparent anesthesia complications

## 2011-08-05 ENCOUNTER — Encounter (HOSPITAL_COMMUNITY): Payer: Self-pay | Admitting: Orthopedic Surgery

## 2012-09-30 ENCOUNTER — Other Ambulatory Visit: Payer: Self-pay | Admitting: *Deleted

## 2012-09-30 MED ORDER — OMEPRAZOLE 40 MG PO CPDR: 40.0000 mg | DELAYED_RELEASE_CAPSULE | Freq: Every day | ORAL | Status: DC

## 2012-12-05 ENCOUNTER — Other Ambulatory Visit: Payer: Self-pay | Admitting: Family Medicine

## 2013-02-12 ENCOUNTER — Other Ambulatory Visit: Payer: Self-pay | Admitting: Nurse Practitioner

## 2013-02-15 ENCOUNTER — Ambulatory Visit: Payer: Self-pay | Admitting: Family Medicine

## 2013-02-15 NOTE — Telephone Encounter (Signed)
Patient has no visit in Epic. Was notified in June that NTBS. Please advise

## 2013-02-15 NOTE — Telephone Encounter (Signed)
No ore refills after this one without being seen

## 2013-02-18 ENCOUNTER — Ambulatory Visit (INDEPENDENT_AMBULATORY_CARE_PROVIDER_SITE_OTHER): Payer: BC Managed Care – PPO | Admitting: Family Medicine

## 2013-02-18 ENCOUNTER — Encounter (INDEPENDENT_AMBULATORY_CARE_PROVIDER_SITE_OTHER): Payer: Self-pay

## 2013-02-18 ENCOUNTER — Encounter: Payer: Self-pay | Admitting: Family Medicine

## 2013-02-18 VITALS — BP 134/88 | HR 65 | Temp 98.0°F | Ht 69.0 in | Wt 189.9 lb

## 2013-02-18 DIAGNOSIS — K219 Gastro-esophageal reflux disease without esophagitis: Secondary | ICD-10-CM

## 2013-02-18 DIAGNOSIS — I10 Essential (primary) hypertension: Secondary | ICD-10-CM

## 2013-02-18 DIAGNOSIS — Z0000 Encounter for general adult medical examination without abnormal findings: Secondary | ICD-10-CM

## 2013-02-18 DIAGNOSIS — E785 Hyperlipidemia, unspecified: Secondary | ICD-10-CM

## 2013-02-18 LAB — POCT CBC
Granulocyte percent: 63.3 %G (ref 37–80)
HCT, POC: 44.6 % (ref 43.5–53.7)
Hemoglobin: 15.1 g/dL (ref 14.1–18.1)
Lymph, poc: 1.7 (ref 0.6–3.4)
MCH, POC: 31.3 pg — AB (ref 27–31.2)
MCHC: 33.8 g/dL (ref 31.8–35.4)
MCV: 92.4 fL (ref 80–97)
MPV: 8 fL (ref 0–99.8)
POC Granulocyte: 3.3 (ref 2–6.9)
POC LYMPH PERCENT: 32.7 %L (ref 10–50)
Platelet Count, POC: 233 10*3/uL (ref 142–424)
RBC: 4.8 M/uL (ref 4.69–6.13)
RDW, POC: 12.4 %
WBC: 5.2 10*3/uL (ref 4.6–10.2)

## 2013-02-18 MED ORDER — LISINOPRIL 20 MG PO TABS: 20.0000 mg | ORAL_TABLET | Freq: Every day | ORAL | Status: DC

## 2013-02-18 MED ORDER — OMEPRAZOLE 40 MG PO CPDR: 40.0000 mg | DELAYED_RELEASE_CAPSULE | Freq: Every day | ORAL | Status: DC

## 2013-02-18 MED ORDER — SIMVASTATIN 20 MG PO TABS: 20.0000 mg | ORAL_TABLET | Freq: Every day | ORAL | Status: DC

## 2013-02-18 NOTE — Patient Instructions (Signed)
Hypertriglyceridemia  Diet for High blood levels of Triglycerides Most fats in food are triglycerides. Triglycerides in your blood are stored as fat in your body. High levels of triglycerides in your blood may put you at a greater risk for heart disease and stroke.  Normal triglyceride levels are less than 150 mg/dL. Borderline high levels are 150-199 mg/dl. High levels are 200 - 499 mg/dL, and very high triglyceride levels are greater than 500 mg/dL. The decision to treat high triglycerides is generally based on the level. For people with borderline or high triglyceride levels, treatment includes weight loss and exercise. Drugs are recommended for people with very high triglyceride levels. Many people who need treatment for high triglyceride levels have metabolic syndrome. This syndrome is a collection of disorders that often include: insulin resistance, high blood pressure, blood clotting problems, high cholesterol and triglycerides. TESTING PROCEDURE FOR TRIGLYCERIDES  You should not eat 4 hours before getting your triglycerides measured. The normal range of triglycerides is between 10 and 250 milligrams per deciliter (mg/dl). Some people may have extreme levels (1000 or above), but your triglyceride level may be too high if it is above 150 mg/dl, depending on what other risk factors you have for heart disease.  People with high blood triglycerides may also have high blood cholesterol levels. If you have high blood cholesterol as well as high blood triglycerides, your risk for heart disease is probably greater than if you only had high triglycerides. High blood cholesterol is one of the main risk factors for heart disease. CHANGING YOUR DIET  Your weight can affect your blood triglyceride level. If you are more than 20% above your ideal body weight, you may be able to lower your blood triglycerides by losing weight. Eating less and exercising regularly is the best way to combat this. Fat provides more  calories than any other food. The best way to lose weight is to eat less fat. Only 30% of your total calories should come from fat. Less than 7% of your diet should come from saturated fat. A diet low in fat and saturated fat is the same as a diet to decrease blood cholesterol. By eating a diet lower in fat, you may lose weight, lower your blood cholesterol, and lower your blood triglyceride level.  Eating a diet low in fat, especially saturated fat, may also help you lower your blood triglyceride level. Ask your dietitian to help you figure how much fat you can eat based on the number of calories your caregiver has prescribed for you.  Exercise, in addition to helping with weight loss may also help lower triglyceride levels.   Alcohol can increase blood triglycerides. You may need to stop drinking alcoholic beverages.  Too much carbohydrate in your diet may also increase your blood triglycerides. Some complex carbohydrates are necessary in your diet. These may include bread, rice, potatoes, other starchy vegetables and cereals.  Reduce "simple" carbohydrates. These may include pure sugars, candy, honey, and jelly without losing other nutrients. If you have the kind of high blood triglycerides that is affected by the amount of carbohydrates in your diet, you will need to eat less sugar and less high-sugar foods. Your caregiver can help you with this.  Adding 2-4 grams of fish oil (EPA+ DHA) may also help lower triglycerides. Speak with your caregiver before adding any supplements to your regimen. Following the Diet  Maintain your ideal weight. Your caregivers can help you with a diet. Generally, eating less food and getting more   exercise will help you lose weight. Joining a weight control group may also help. Ask your caregivers for a good weight control group in your area.  Eat low-fat foods instead of high-fat foods. This can help you lose weight too.  These foods are lower in fat. Eat MORE of these:    Dried beans, peas, and lentils.  Egg whites.  Low-fat cottage cheese.  Fish.  Lean cuts of meat, such as round, sirloin, rump, and flank (cut extra fat off meat you fix).  Whole grain breads, cereals and pasta.  Skim and nonfat dry milk.  Low-fat yogurt.  Poultry without the skin.  Cheese made with skim or part-skim milk, such as mozzarella, parmesan, farmers', ricotta, or pot cheese. These are higher fat foods. Eat LESS of these:   Whole milk and foods made from whole milk, such as American, blue, cheddar, monterey jack, and swiss cheese  High-fat meats, such as luncheon meats, sausages, knockwurst, bratwurst, hot dogs, ribs, corned beef, ground pork, and regular ground beef.  Fried foods. Limit saturated fats in your diet. Substituting unsaturated fat for saturated fat may decrease your blood triglyceride level. You will need to read package labels to know which products contain saturated fats.  These foods are high in saturated fat. Eat LESS of these:   Fried pork skins.  Whole milk.  Skin and fat from poultry.  Palm oil.  Butter.  Shortening.  Cream cheese.  Bacon.  Margarines and baked goods made from listed oils.  Vegetable shortenings.  Chitterlings.  Fat from meats.  Coconut oil.  Palm kernel oil.  Lard.  Cream.  Sour cream.  Fatback.  Coffee whiteners and non-dairy creamers made with these oils.  Cheese made from whole milk. Use unsaturated fats (both polyunsaturated and monounsaturated) moderately. Remember, even though unsaturated fats are better than saturated fats; you still want a diet low in total fat.  These foods are high in unsaturated fat:   Canola oil.  Sunflower oil.  Mayonnaise.  Almonds.  Peanuts.  Pine nuts.  Margarines made with these oils.  Safflower oil.  Olive oil.  Avocados.  Cashews.  Peanut butter.  Sunflower seeds.  Soybean oil.  Peanut  oil.  Olives.  Pecans.  Walnuts.  Pumpkin seeds. Avoid sugar and other high-sugar foods. This will decrease carbohydrates without decreasing other nutrients. Sugar in your food goes rapidly to your blood. When there is excess sugar in your blood, your liver may use it to make more triglycerides. Sugar also contains calories without other important nutrients.  Eat LESS of these:   Sugar, brown sugar, powdered sugar, jam, jelly, preserves, honey, syrup, molasses, pies, candy, cakes, cookies, frosting, pastries, colas, soft drinks, punches, fruit drinks, and regular gelatin.  Avoid alcohol. Alcohol, even more than sugar, may increase blood triglycerides. In addition, alcohol is high in calories and low in nutrients. Ask for sparkling water, or a diet soft drink instead of an alcoholic beverage. Suggestions for planning and preparing meals   Bake, broil, grill or roast meats instead of frying.  Remove fat from meats and skin from poultry before cooking.  Add spices, herbs, lemon juice or vinegar to vegetables instead of salt, rich sauces or gravies.  Use a non-stick skillet without fat or use no-stick sprays.  Cool and refrigerate stews and broth. Then remove the hardened fat floating on the surface before serving.  Refrigerate meat drippings and skim off fat to make low-fat gravies.  Serve more fish.  Use less butter,   margarine and other high-fat spreads on bread or vegetables.  Use skim or reconstituted non-fat dry milk for cooking.  Cook with low-fat cheeses.  Substitute low-fat yogurt or cottage cheese for all or part of the sour cream in recipes for sauces, dips or congealed salads.  Use half yogurt/half mayonnaise in salad recipes.  Substitute evaporated skim milk for cream. Evaporated skim milk or reconstituted non-fat dry milk can be whipped and substituted for whipped cream in certain recipes.  Choose fresh fruits for dessert instead of high-fat foods such as pies or  cakes. Fruits are naturally low in fat. When Dining Out   Order low-fat appetizers such as fruit or vegetable juice, pasta with vegetables or tomato sauce.  Select clear, rather than cream soups.  Ask that dressings and gravies be served on the side. Then use less of them.  Order foods that are baked, broiled, poached, steamed, stir-fried, or roasted.  Ask for margarine instead of butter, and use only a small amount.  Drink sparkling water, unsweetened tea or coffee, or diet soft drinks instead of alcohol or other sweet beverages. QUESTIONS AND ANSWERS ABOUT OTHER FATS IN THE BLOOD: SATURATED FAT, TRANS FAT, AND CHOLESTEROL What is trans fat? Trans fat is a type of fat that is formed when vegetable oil is hardened through a process called hydrogenation. This process helps makes foods more solid, gives them shape, and prolongs their shelf life. Trans fats are also called hydrogenated or partially hydrogenated oils.  What do saturated fat, trans fat, and cholesterol in foods have to do with heart disease? Saturated fat, trans fat, and cholesterol in the diet all raise the level of LDL "bad" cholesterol in the blood. The higher the LDL cholesterol, the greater the risk for coronary heart disease (CHD). Saturated fat and trans fat raise LDL similarly.  What foods contain saturated fat, trans fat, and cholesterol? High amounts of saturated fat are found in animal products, such as fatty cuts of meat, chicken skin, and full-fat dairy products like butter, whole milk, cream, and cheese, and in tropical vegetable oils such as palm, palm kernel, and coconut oil. Trans fat is found in some of the same foods as saturated fat, such as vegetable shortening, some margarines (especially hard or stick margarine), crackers, cookies, baked goods, fried foods, salad dressings, and other processed foods made with partially hydrogenated vegetable oils. Small amounts of trans fat also occur naturally in some animal  products, such as milk products, beef, and lamb. Foods high in cholesterol include liver, other organ meats, egg yolks, shrimp, and full-fat dairy products. How can I use the new food label to make heart-healthy food choices? Check the Nutrition Facts panel of the food label. Choose foods lower in saturated fat, trans fat, and cholesterol. For saturated fat and cholesterol, you can also use the Percent Daily Value (%DV): 5% DV or less is low, and 20% DV or more is high. (There is no %DV for trans fat.) Use the Nutrition Facts panel to choose foods low in saturated fat and cholesterol, and if the trans fat is not listed, read the ingredients and limit products that list shortening or hydrogenated or partially hydrogenated vegetable oil, which tend to be high in trans fat. POINTS TO REMEMBER:   Discuss your risk for heart disease with your caregivers, and take steps to reduce risk factors.  Change your diet. Choose foods that are low in saturated fat, trans fat, and cholesterol.  Add exercise to your daily routine if   it is not already being done. Participate in physical activity of moderate intensity, like brisk walking, for at least 30 minutes on most, and preferably all days of the week. No time? Break the 30 minutes into three, 10-minute segments during the day.  Stop smoking. If you do smoke, contact your caregiver to discuss ways in which they can help you quit.  Do not use street drugs.  Maintain a normal weight.  Maintain a healthy blood pressure.  Keep up with your blood work for checking the fats in your blood as directed by your caregiver. Document Released: 01/18/2004 Document Revised: 10/01/2011 Document Reviewed: 08/15/2008 [employer] Patient Information 2014 [employer].  

## 2013-02-18 NOTE — Progress Notes (Signed)
  Subjective:    Patient ID: Gregg Morgan, male    DOB: 1961-04-03, 52 y.o.   MRN: [id number]  HPI This 52 y.o. male presents for evaluation of hyperlipidemia, hypertension, and GERD. He has been having some myalgias.  He states he has been off his atorvastatin For a few days and the discomfort has left.   Review of Systems C/o myalgias No chest pain, SOB, HA, dizziness, vision change, N/V, diarrhea, constipation, dysuria, urinary urgency or frequency, myalgias, arthralgias or rash.     Objective:   Physical Exam Vital signs noted  Well developed well nourished male.  HEENT - Head atraumatic Normocephalic                Eyes - PERRLA, Conjuctiva - clear Sclera- Clear EOMI                Ears - EAC's Wnl TM's Wnl Gross Hearing WNL                Nose - Nares patent                 Throat - oropharanx wnl Respiratory - Lungs CTA bilateral Cardiac - RRR S1 and S2 without murmur GI - Abdomen soft Nontender and bowel sounds active x 4 Extremities - No edema. Neuro - Grossly intact.       Assessment & Plan:  GERD (gastroesophageal reflux disease) - Plan: omeprazole (PRILOSEC) 40 MG capsule  Essential hypertension, benign - Plan: lisinopril (PRINIVIL,ZESTRIL) 20 MG tablet  Hyperlipidemia - Plan: Lipid panel, simvastatin (ZOCOR) 20 MG tablet  Routine general medical examination at a health care facility - Plan: POCT CBC, CMP14+EGFR, Lipid panel, PSA, total and free, Thyroid Panel With TSH  Follow up in 6 months and follow up for FLP and LFT in 3 months.  Deatra Canter FNP

## 2013-02-19 LAB — CMP14+EGFR
ALT: 34 IU/L (ref 0–44)
AST: 23 IU/L (ref 0–40)
Albumin/Globulin Ratio: 1.8 (ref 1.1–2.5)
Albumin: 4.4 g/dL (ref 3.5–5.5)
Alkaline Phosphatase: 86 IU/L (ref 39–117)
BUN/Creatinine Ratio: 11 (ref 9–20)
BUN: 10 mg/dL (ref 6–24)
CO2: 23 mmol/L (ref 18–29)
Calcium: 9.6 mg/dL (ref 8.7–10.2)
Chloride: 101 mmol/L (ref 97–108)
Creatinine, Ser: 0.94 mg/dL (ref 0.76–1.27)
GFR calc Af Amer: 107 mL/min/{1.73_m2} (ref >59)
GFR calc non Af Amer: 93 mL/min/{1.73_m2} (ref >59)
Globulin, Total: 2.4 g/dL (ref 1.5–4.5)
Glucose: 100 mg/dL — ABNORMAL HIGH (ref 65–99)
Potassium: 4.6 mmol/L (ref 3.5–5.2)
Sodium: 140 mmol/L (ref 134–144)
Total Bilirubin: 0.3 mg/dL (ref 0.0–1.2)
Total Protein: 6.8 g/dL (ref 6.0–8.5)

## 2013-02-19 LAB — PSA, TOTAL AND FREE
PSA, Free Pct: 37.1 %
PSA, Free: 0.26 ng/mL
PSA: 0.7 ng/mL (ref 0.0–4.0)

## 2013-02-19 LAB — THYROID PANEL WITH TSH
Free Thyroxine Index: 2.2 (ref 1.2–4.9)
T3 Uptake Ratio: 31 % (ref 24–39)
T4, Total: 7 ug/dL (ref 4.5–12.0)
TSH: 1.63 u[IU]/mL (ref 0.450–4.500)

## 2013-02-19 LAB — LIPID PANEL
Chol/HDL Ratio: 4.5 ratio units (ref 0.0–5.0)
Cholesterol, Total: 253 mg/dL — ABNORMAL HIGH (ref 100–199)
HDL: 56 mg/dL (ref >39)
LDL Calculated: 168 mg/dL — ABNORMAL HIGH (ref 0–99)
Triglycerides: 145 mg/dL (ref 0–149)
VLDL Cholesterol Cal: 29 mg/dL (ref 5–40)

## 2013-02-22 ENCOUNTER — Other Ambulatory Visit: Payer: Self-pay | Admitting: Family Medicine

## 2013-02-22 MED ORDER — SIMVASTATIN 40 MG PO TABS: 40.0000 mg | ORAL_TABLET | Freq: Every day | ORAL | Status: DC

## 2013-02-24 ENCOUNTER — Telehealth: Payer: Self-pay | Admitting: Family Medicine

## 2013-02-24 NOTE — Telephone Encounter (Signed)
called

## 2013-05-24 ENCOUNTER — Ambulatory Visit: Payer: BC Managed Care – PPO | Admitting: Family Medicine

## 2014-03-04 ENCOUNTER — Other Ambulatory Visit: Payer: Self-pay | Admitting: Family Medicine

## 2014-03-29 ENCOUNTER — Other Ambulatory Visit: Payer: Self-pay | Admitting: Family Medicine

## 2014-04-15 ENCOUNTER — Encounter (HOSPITAL_BASED_OUTPATIENT_CLINIC_OR_DEPARTMENT_OTHER): Payer: Self-pay | Admitting: *Deleted

## 2014-04-15 ENCOUNTER — Emergency Department (HOSPITAL_BASED_OUTPATIENT_CLINIC_OR_DEPARTMENT_OTHER): Payer: BC Managed Care – PPO

## 2014-04-15 ENCOUNTER — Emergency Department (HOSPITAL_BASED_OUTPATIENT_CLINIC_OR_DEPARTMENT_OTHER)
Admission: EM | Admit: 2014-04-15 | Discharge: 2014-04-15 | Disposition: A | Discharge status: Home / Self Care | Payer: BC Managed Care – PPO | Attending: Emergency Medicine | Admitting: Emergency Medicine

## 2014-04-15 DIAGNOSIS — S43102A Unspecified dislocation of left acromioclavicular joint, initial encounter: Secondary | ICD-10-CM | POA: Insufficient documentation

## 2014-04-15 DIAGNOSIS — W010XXA Fall on same level from slipping, tripping and stumbling without subsequent striking against object, initial encounter: Secondary | ICD-10-CM | POA: Diagnosis not present

## 2014-04-15 DIAGNOSIS — I10 Essential (primary) hypertension: Secondary | ICD-10-CM | POA: Insufficient documentation

## 2014-04-15 DIAGNOSIS — Y9289 Other specified places as the place of occurrence of the external cause: Secondary | ICD-10-CM | POA: Diagnosis not present

## 2014-04-15 DIAGNOSIS — I2510 Atherosclerotic heart disease of native coronary artery without angina pectoris: Secondary | ICD-10-CM | POA: Insufficient documentation

## 2014-04-15 DIAGNOSIS — E785 Hyperlipidemia, unspecified: Secondary | ICD-10-CM | POA: Diagnosis not present

## 2014-04-15 DIAGNOSIS — Z7982 Long term (current) use of aspirin: Secondary | ICD-10-CM | POA: Diagnosis not present

## 2014-04-15 DIAGNOSIS — Y9301 Activity, walking, marching and hiking: Secondary | ICD-10-CM | POA: Diagnosis not present

## 2014-04-15 DIAGNOSIS — Z79899 Other long term (current) drug therapy: Secondary | ICD-10-CM | POA: Diagnosis not present

## 2014-04-15 DIAGNOSIS — K219 Gastro-esophageal reflux disease without esophagitis: Secondary | ICD-10-CM | POA: Insufficient documentation

## 2014-04-15 DIAGNOSIS — Q899 Congenital malformation, unspecified: Secondary | ICD-10-CM

## 2014-04-15 DIAGNOSIS — Z720 Tobacco use: Secondary | ICD-10-CM | POA: Insufficient documentation

## 2014-04-15 DIAGNOSIS — Z8669 Personal history of other diseases of the nervous system and sense organs: Secondary | ICD-10-CM | POA: Diagnosis not present

## 2014-04-15 DIAGNOSIS — Y998 Other external cause status: Secondary | ICD-10-CM | POA: Diagnosis not present

## 2014-04-15 DIAGNOSIS — S4992XA Unspecified injury of left shoulder and upper arm, initial encounter: Secondary | ICD-10-CM | POA: Diagnosis present

## 2014-04-15 MED ORDER — LISINOPRIL 20 MG PO TABS: 20.0000 mg | ORAL_TABLET | Freq: Every day | ORAL | Status: DC

## 2014-04-15 MED ORDER — HYDROCODONE-ACETAMINOPHEN 5-325 MG PO TABS: 1.0000 | ORAL_TABLET | ORAL | Status: DC | PRN

## 2014-04-15 NOTE — Discharge Instructions (Signed)
Acromioclavicular Injuries °The AC (acromioclavicular) joint is the joint in the shoulder where the collarbone (clavicle) meets the shoulder blade (scapula). The part of the shoulder blade connected to the collarbone is called the acromion. Common problems with and treatments for the AC joint are detailed below. °ARTHRITIS °Arthritis occurs when the joint has been injured and the smooth padding between the joints (cartilage) is lost. This is the wear and tear seen in most joints of the body if they have been overused. This causes the joint to produce pain and swelling which is worse with activity.  °AC JOINT SEPARATION °AC joint separation means that the ligaments connecting the acromion of the shoulder blade and collarbone have been damaged, and the two bones no longer line up. AC separations can be anywhere from mild to severe, and are "graded" depending upon which ligaments are torn and how badly they are torn. °· Grade I Injury: the least damage is done, and the AC joint still lines up. °· Grade II Injury: damage to the ligaments which reinforce the AC joint. In a Grade II injury, these ligaments are stretched but not entirely torn. When stressed, the AC joint becomes painful and unstable. °· Grade III Injury: AC and secondary ligaments are completely torn, and the collarbone is no longer attached to the shoulder blade. This results in deformity; a prominence of the end of the clavicle. °AC JOINT FRACTURE °AC joint fracture means that there has been a break in the bones of the AC joint, usually the end of the clavicle. °TREATMENT °TREATMENT OF AC ARTHRITIS °· There is currently no way to replace the cartilage damaged by arthritis. The best way to improve the condition is to decrease the activities which aggravate the problem. Application of ice to the joint helps decrease pain and soreness (inflammation). The use of non-steroidal anti-inflammatory medication is helpful. °· If less conservative measures do not  work, then cortisone shots (injections) may be used. These are anti-inflammatories; they decrease the soreness in the joint and swelling. °· If non-surgical measures fail, surgery may be recommended. The procedure is generally removal of a portion of the end of the clavicle. This is the part of the collarbone closest to your acromion which is stabilized with ligaments to the acromion of the shoulder blade. This surgery may be performed using a tube-like instrument with a light (arthroscope) for looking into a joint. It may also be performed as an open surgery through a small incision by the surgeon. Most patients will have good range of motion within 6 weeks and may return to all activity including sports by 8-12 weeks, barring complications. °TREATMENT OF AN AC SEPARATION °· The initial treatment is to decrease pain. This is best accomplished by immobilizing the arm in a sling and placing an ice pack to the shoulder for 20 to 30 minutes every 2 hours as needed. As the pain starts to subside, it is important to begin moving the fingers, wrist, elbow and eventually the shoulder in order to prevent a stiff or "frozen" shoulder. Instruction on when and how much to move the shoulder will be provided by your caregiver. The length of time needed to regain full motion and function depends on the amount or grade of the injury. Recovery from a Grade I AC separation usually takes 10 to 14 days, whereas a Grade III may take 6 to 8 weeks. °· Grade I and II separations usually do not require surgery. Even Grade III injuries usually allow return to full   activity with few restrictions. Treatment is also based on the activity demands of the injured shoulder. For example, a high level quarterback with an injured throwing arm will receive more aggressive treatment than someone with a desk job who rarely uses his/her arm for strenuous activities. In some cases, a painful lump may persist which could require a later surgery. Surgery  can be very successful, but the benefits must be weighed against the potential risks. °TREATMENT OF AN AC JOINT FRACTURE °Fracture treatment depends on the type of fracture. Sometimes a splint or sling may be all that is required. Other times surgery may be required for repair. This is more frequently the case when the ligaments supporting the clavicle are completely torn. Your caregiver will help you with these decisions and together you can decide what will be the best treatment. °HOME CARE INSTRUCTIONS  °· Apply ice to the injury for 15-20 minutes each hour while awake for 2 days. Put the ice in a plastic bag and place a towel between the bag of ice and skin. °· If a sling has been applied, wear it constantly for as long as directed by your caregiver, even at night. The sling or splint can be removed for bathing or showering or as directed. Be sure to keep the shoulder in the same place as when the sling is on. Do not lift the arm. °· If a figure-of-eight splint has been applied it should be tightened gently by another person every day. Tighten it enough to keep the shoulders held back. Allow enough room to place the index finger between the body and strap. Loosen the splint immediately if there is numbness or tingling in the hands. °· Take over-the-counter or prescription medicines for pain, discomfort or fever as directed by your caregiver. °· If you or your child has received a follow up appointment, it is very important to keep that appointment in order to avoid long term complications, chronic pain or disability. °SEEK MEDICAL CARE IF:  °· The pain is not relieved with medications. °· There is increased swelling or discoloration that continues to get worse rather than better. °· You or your child has been unable to follow up as instructed. °· There is progressive numbness and tingling in the arm, forearm or hand. °SEEK IMMEDIATE MEDICAL CARE IF:  °· The arm is numb, cold or pale. °· There is increasing pain  in the hand, forearm or fingers. °MAKE SURE YOU:  °· Understand these instructions. °· Will watch your condition. °· Will get help right away if you are not doing well or get worse. °Document Released: 01/09/2005 Document Revised: 06/24/2011 Document Reviewed: 07/04/2008 °[employer]® Patient Information ©2015 [employer]. This information is not intended to replace advice given to you by your health care provider. Make sure you discuss any questions you have with your health care provider. ° °

## 2014-04-15 NOTE — ED Notes (Signed)
Pt reports falling on his (L) shoulder on concrete yesterday.  Obvious deformity to shoulder.  Pt able to move arm.  Denies hitting head. Left radial pulse 2+

## 2014-04-15 NOTE — ED Provider Notes (Signed)
CSN: [id number]     Arrival date & time 04/15/14  1556 History   None    Chief Complaint  Patient presents with  . Shoulder Injury     (Consider location/radiation/quality/duration/timing/severity/associated sxs/prior Treatment) HPI Comments: Pt states that he fell and landed on his left shoulder yesterday. Denies loc. Having pain that is unrelieved with tylenol. States that he has full rom  Patient is a 54 y.o. male presenting with shoulder injury. The history is provided by the patient. No language interpreter was used.  Shoulder Injury This is a new problem. The current episode started yesterday. The problem occurs constantly. The problem has been unchanged. Pertinent negatives include no fever, numbness or weakness. He has tried acetaminophen for the symptoms.    Past Medical History  Diagnosis Date  . Hypertension   . Hyperlipidemia   . Coronary artery disease   . Obstructive sleep apnea   . GERD (gastroesophageal reflux disease)    Past Surgical History  Procedure Laterality Date  . None    . Finger surgery    . Cardiac catheterization      09/2009--DR  BERRY  . Orif finger fracture  08/03/2011    Procedure: OPEN REDUCTION INTERNAL FIXATION (ORIF) METACARPAL (FINGER) FRACTURE;  Surgeon: Sharma Covert, MD;  Location: [facility] OR;  Service: Orthopedics;  Laterality: Right;  RIGHT THUMB ORIF   Family History  Problem Relation Age of Onset  . Colon cancer Father    History  Substance Use Topics  . Smoking status: Current Every Day Smoker -- 0.50 packs/day for 20 years    Last Attempt to Quit: 09/30/2009  . Smokeless tobacco: Never Used  . Alcohol Use: 1.2 oz/week    2 Cans of beer per week     Comment: Occasion on weekends    Review of Systems  Constitutional: Negative for fever.  Neurological: Negative for weakness and numbness.  All other systems reviewed and are negative.     Allergies  Review of patient's allergies indicates no known allergies.  Home  Medications   Prior to Admission medications   Medication Sig Start Date End Date Taking? Authorizing Provider  aspirin 81 MG tablet Take 81 mg by mouth at bedtime.     Historical Provider, MD  Cholecalciferol (VITAMIN D) 2000 UNITS CAPS Take 1 capsule by mouth at bedtime.     Historical Provider, MD  fish oil-omega-3 fatty acids 1000 MG capsule Take 1,000 mg by mouth at bedtime.     Historical Provider, MD  HYDROcodone-acetaminophen (NORCO/VICODIN) 5-325 MG per tablet Take 1-2 tablets by mouth every 4 (four) hours as needed. 04/15/14   Teressa Lower, NP  lisinopril (PRINIVIL,ZESTRIL) 20 MG tablet Take 1 tablet (20 mg total) by mouth daily. 02/18/13   Deatra Canter, FNP  lisinopril (PRINIVIL,ZESTRIL) 20 MG tablet Take 1 tablet (20 mg total) by mouth daily. 04/15/14   Teressa Lower, NP  omeprazole (PRILOSEC) 40 MG capsule Take 1 capsule (40 mg total) by mouth daily. 02/18/13   Deatra Canter, FNP  simvastatin (ZOCOR) 40 MG tablet Take 1 tablet (40 mg total) by mouth at bedtime. 02/22/13   Deatra Canter, FNP   BP 165/103 mmHg  Pulse 79  Temp(Src) 99.2 F (37.3 C) (Oral)  Resp 18  Ht  (1.727 m)  Wt 185 lb (83.915 kg)  BMI 28.14 kg/m2  SpO2 98% Physical Exam  Constitutional: He is oriented to person, place, and time. He appears well-developed and well-nourished.  Cardiovascular: Normal  rate and regular rhythm.   Pulmonary/Chest: Effort normal and breath sounds normal.  Musculoskeletal: Normal range of motion.  Slight raised area noted at the end of clavicle. Full rom of left shoulder  Neurological: He is alert and oriented to person, place, and time.  Skin: Skin is warm and dry.  Nursing note and vitals reviewed.   ED Course  Procedures (including critical care time) Labs Review Labs Reviewed - No data to display  Imaging Review Dg Shoulder Left  04/15/2014   CLINICAL DATA:  [place] yesterday, tripping while walking. Fall onto left shoulder with subsequent pain.  EXAM:  LEFT SHOULDER - 2+ VIEW  COMPARISON:  Chest radiography 08/01/2011  FINDINGS: There is AC joint separation with widening of the [facility] joint but no elevation of the distal clavicle. No fracture. Glenohumeral joint is normal.  IMPRESSION: AC separation with widening of the joint space but no elevation of the clavicle or fracture.   Electronically Signed   By: Paulina Fusi M.D.   On: 04/15/2014 16:32     EKG Interpretation None      MDM   Final diagnoses:  AC separation, left, initial encounter  Essential hypertension    Discussed follow up with Dr. Pearletha Forge. Pt has been out of his bp medication for 1 week and needs to make and appointment with pcp. Will give a script for lisinopril    Teressa Lower, NP 04/15/14 1739  Rolan Bucco, MD 04/15/14 2118

## 2014-05-02 ENCOUNTER — Ambulatory Visit (INDEPENDENT_AMBULATORY_CARE_PROVIDER_SITE_OTHER): Payer: BLUE CROSS/BLUE SHIELD | Admitting: Family Medicine

## 2014-05-02 ENCOUNTER — Encounter: Payer: Self-pay | Admitting: Family Medicine

## 2014-05-02 VITALS — BP 123/91 | HR 74 | Temp 97.4°F | Ht 69.0 in | Wt 188.8 lb

## 2014-05-02 DIAGNOSIS — K219 Gastro-esophageal reflux disease without esophagitis: Secondary | ICD-10-CM

## 2014-05-02 DIAGNOSIS — I10 Essential (primary) hypertension: Secondary | ICD-10-CM

## 2014-05-02 DIAGNOSIS — E559 Vitamin D deficiency, unspecified: Secondary | ICD-10-CM

## 2014-05-02 DIAGNOSIS — E785 Hyperlipidemia, unspecified: Secondary | ICD-10-CM

## 2014-05-02 MED ORDER — LISINOPRIL 20 MG PO TABS: 20.0000 mg | ORAL_TABLET | Freq: Every day | ORAL | Status: DC

## 2014-05-02 MED ORDER — SIMVASTATIN 40 MG PO TABS: 40.0000 mg | ORAL_TABLET | Freq: Every day | ORAL | Status: DC

## 2014-05-02 MED ORDER — OMEPRAZOLE 40 MG PO CPDR: 40.0000 mg | DELAYED_RELEASE_CAPSULE | Freq: Every day | ORAL | Status: DC

## 2014-05-02 NOTE — Progress Notes (Addendum)
Subjective:    Patient ID: Gregg Morgan, male    DOB: 1960/11/30, 54 y.o.   MRN: [id number]  HPI  Patient is here today for follow up of chronic medical problems which include hypertension, hyperlipidemia and GERD.  Patient in for follow-up of hypertension. Patient has no history of headache chest pain or shortness of breath or recent cough. Patient also denies symptoms of TIA such as numbness weakness lateralizing. Patient checks  blood pressure at home and has not had any elevated readings recently. Patient denies side effects from his medication. States taking it regularly.   Patient in for follow-up of elevated cholesterol. Doing well without complaints on current medication. Denies side effects of fish oil including myalgia and arthralgia and nausea. Also in today for liver function testing. Currently no chest pain, shortness of breath or other cardiovascular related symptoms noted.  Patient in for follow-up of GERD. He is currently asymptomatic taking his PPI daily. There is no chest pain or heartburn. He is taking the medication regularly. No dysphagia or choking.   Review of Systems  Constitutional: Negative for fever, chills, diaphoresis and unexpected weight change.  HENT: Negative for congestion, hearing loss, rhinorrhea, sore throat and trouble swallowing.   Gastrointestinal: Negative for nausea, vomiting, abdominal pain, diarrhea, constipation and abdominal distention.  Endocrine: Negative for cold intolerance and heat intolerance.  Genitourinary: Negative for dysuria, hematuria and flank pain.  Musculoskeletal: Negative for joint swelling and arthralgias.  Skin: Negative for rash.  Neurological: Negative for dizziness and headaches.  Psychiatric/Behavioral: Negative for dysphoric mood, decreased concentration and agitation. The patient is not nervous/anxious.    Patient Active Problem List   Diagnosis Date Noted  . Nonspecific abnormal finding in stool contents  07/30/2010  . Coronary artery disease 06/15/2010  . Hyperlipidemia 06/15/2010  . Hypertension 06/15/2010  . Obstructive sleep apnea on CPAP 06/15/2010   Outpatient Encounter Prescriptions as of 05/02/2014  Medication Sig  . aspirin 81 MG tablet Take 81 mg by mouth at bedtime.   . Cholecalciferol (VITAMIN D) 2000 UNITS CAPS Take 1 capsule by mouth at bedtime.   . fish oil-omega-3 fatty acids 1000 MG capsule Take 1,000 mg by mouth at bedtime.   Marland Kitchen lisinopril (PRINIVIL,ZESTRIL) 20 MG tablet Take 1 tablet (20 mg total) by mouth daily.  Marland Kitchen omeprazole (PRILOSEC) 40 MG capsule Take 1 capsule (40 mg total) by mouth daily.  . simvastatin (ZOCOR) 40 MG tablet Take 1 tablet (40 mg total) by mouth at bedtime.  . [DISCONTINUED] HYDROcodone-acetaminophen (NORCO/VICODIN) 5-325 MG per tablet Take 1-2 tablets by mouth every 4 (four) hours as needed. (Patient not taking: Reported on 05/02/2014)  . [DISCONTINUED] lisinopril (PRINIVIL,ZESTRIL) 20 MG tablet Take 1 tablet (20 mg total) by mouth daily. (Patient not taking: Reported on 05/02/2014)      Objective:   Physical Exam  Constitutional: He is oriented to person, place, and time. He appears well-developed and well-nourished. No distress.  HENT:  Head: Normocephalic and atraumatic.  Right Ear: External ear normal.  Left Ear: External ear normal.  Nose: Nose normal.  Mouth/Throat: Oropharynx is clear and moist.  Eyes: Conjunctivae and EOM are normal. Pupils are equal, round, and reactive to light.  Neck: Normal range of motion. Neck supple. No thyromegaly present.  Cardiovascular: Normal rate, regular rhythm and normal heart sounds.   No murmur heard. Pulmonary/Chest: Effort normal and breath sounds normal. No respiratory distress. He has no wheezes. He has no rales.  Abdominal: Soft. Bowel sounds are normal.  He exhibits no distension. There is no tenderness.  Lymphadenopathy:    He has no cervical adenopathy.  Neurological: He is alert and oriented to  person, place, and time. He has normal reflexes.  Skin: Skin is warm and dry.  Psychiatric: He has a normal mood and affect. His behavior is normal. Judgment and thought content normal.   BP 123/91 mmHg  Pulse 74  Temp(Src) 97.4 F (36.3 C) (Oral)  Ht _0  (1.753 m)  Wt 188 lb 12.8 oz (85.639 kg)  BMI 27.87 kg/m2        Assessment & Plan:  1. Hyperlipidemia - Lipid panel - CMP14+EGFR - Vit D  25 hydroxy (rtn osteoporosis monitoring)  2. Essential hypertension  - lisinopril (PRINIVIL,ZESTRIL) 20 MG tablet; Take 1 tablet (20 mg total) by mouth daily.  Dispense: 30 tablet; Refill: 5 - Lipid panel - CMP14+EGFR - Vit D  25 hydroxy (rtn osteoporosis monitoring)  3. Gastroesophageal reflux disease without esophagitis - omeprazole (PRILOSEC) 40 MG capsule; Take 1 capsule (40 mg total) by mouth daily.  Dispense: 30 capsule; Refill: 5 - Lipid panel - CMP14+EGFR - Vit D  25 hydroxy (rtn osteoporosis monitoring)  4. Vitamin D deficiency - Vit D  25 hydroxy (rtn osteoporosis monitoring)

## 2014-05-03 ENCOUNTER — Other Ambulatory Visit: Payer: Self-pay | Admitting: Family Medicine

## 2014-05-03 LAB — LIPID PANEL
CHOLESTEROL TOTAL: 328 mg/dL — AB (ref 100–199)
Chol/HDL Ratio: 6 ratio units — ABNORMAL HIGH (ref 0.0–5.0)
HDL: 55 mg/dL (ref >39)
LDL Calculated: 223 mg/dL — ABNORMAL HIGH (ref 0–99)
Triglycerides: 250 mg/dL — ABNORMAL HIGH (ref 0–149)
VLDL Cholesterol Cal: 50 mg/dL — ABNORMAL HIGH (ref 5–40)

## 2014-05-03 LAB — CMP14+EGFR
A/G RATIO: 2 (ref 1.1–2.5)
ALT: 28 IU/L (ref 0–44)
AST: 21 IU/L (ref 0–40)
Albumin: 4.7 g/dL (ref 3.5–5.5)
Alkaline Phosphatase: 94 IU/L (ref 39–117)
BILIRUBIN TOTAL: 0.4 mg/dL (ref 0.0–1.2)
BUN/Creatinine Ratio: 17 (ref 9–20)
BUN: 16 mg/dL (ref 6–24)
CALCIUM: 9.7 mg/dL (ref 8.7–10.2)
CHLORIDE: 99 mmol/L (ref 97–108)
CO2: 25 mmol/L (ref 18–29)
Creatinine, Ser: 0.92 mg/dL (ref 0.76–1.27)
GFR, EST AFRICAN AMERICAN: 109 mL/min/{1.73_m2} (ref >59)
GFR, EST NON AFRICAN AMERICAN: 95 mL/min/{1.73_m2} (ref >59)
GLUCOSE: 118 mg/dL — AB (ref 65–99)
Globulin, Total: 2.3 g/dL (ref 1.5–4.5)
POTASSIUM: 4.6 mmol/L (ref 3.5–5.2)
Sodium: 138 mmol/L (ref 134–144)
Total Protein: 7 g/dL (ref 6.0–8.5)

## 2014-05-03 LAB — VITAMIN D 25 HYDROXY (VIT D DEFICIENCY, FRACTURES): Vit D, 25-Hydroxy: 25.5 ng/mL — ABNORMAL LOW (ref 30.0–100.0)

## 2014-05-03 MED ORDER — ATORVASTATIN CALCIUM 80 MG PO TABS: 80.0000 mg | ORAL_TABLET | Freq: Every day | ORAL | Status: DC

## 2014-05-03 MED ORDER — VITAMIN D (ERGOCALCIFEROL) 1.25 MG (50000 UNIT) PO CAPS: 50000.0000 [IU] | ORAL_CAPSULE | ORAL | Status: DC

## 2014-05-09 NOTE — Addendum Note (Signed)
Addended by: Mechele ClaudeSTACKS, Erynn Vaca on: 05/09/2014 12:01 PM   Modules accepted: Medications

## 2014-11-04 ENCOUNTER — Encounter: Payer: BLUE CROSS/BLUE SHIELD | Admitting: Family Medicine

## 2014-11-08 ENCOUNTER — Other Ambulatory Visit: Payer: Self-pay | Admitting: *Deleted

## 2014-11-08 MED ORDER — ATORVASTATIN CALCIUM 80 MG PO TABS: 80.0000 mg | ORAL_TABLET | Freq: Every day | ORAL | Status: DC

## 2014-11-14 ENCOUNTER — Encounter: Payer: BLUE CROSS/BLUE SHIELD | Admitting: Family Medicine

## 2014-11-29 ENCOUNTER — Ambulatory Visit (INDEPENDENT_AMBULATORY_CARE_PROVIDER_SITE_OTHER): Payer: BLUE CROSS/BLUE SHIELD | Admitting: Family Medicine

## 2014-11-29 ENCOUNTER — Encounter: Payer: Self-pay | Admitting: Family Medicine

## 2014-11-29 VITALS — BP 139/83 | HR 72 | Temp 98.0°F | Ht 69.0 in | Wt 190.4 lb

## 2014-11-29 DIAGNOSIS — Z1212 Encounter for screening for malignant neoplasm of rectum: Secondary | ICD-10-CM

## 2014-11-29 DIAGNOSIS — K219 Gastro-esophageal reflux disease without esophagitis: Secondary | ICD-10-CM | POA: Diagnosis not present

## 2014-11-29 DIAGNOSIS — I10 Essential (primary) hypertension: Secondary | ICD-10-CM | POA: Diagnosis not present

## 2014-11-29 DIAGNOSIS — E785 Hyperlipidemia, unspecified: Secondary | ICD-10-CM | POA: Diagnosis not present

## 2014-11-29 DIAGNOSIS — Z9989 Dependence on other enabling machines and devices: Secondary | ICD-10-CM

## 2014-11-29 DIAGNOSIS — I25119 Atherosclerotic heart disease of native coronary artery with unspecified angina pectoris: Secondary | ICD-10-CM

## 2014-11-29 DIAGNOSIS — G4733 Obstructive sleep apnea (adult) (pediatric): Secondary | ICD-10-CM

## 2014-11-29 DIAGNOSIS — Z0000 Encounter for general adult medical examination without abnormal findings: Secondary | ICD-10-CM

## 2014-11-29 DIAGNOSIS — Z139 Encounter for screening, unspecified: Secondary | ICD-10-CM

## 2014-11-29 MED ORDER — ATORVASTATIN CALCIUM 80 MG PO TABS: 80.0000 mg | ORAL_TABLET | Freq: Every day | ORAL | Status: DC

## 2014-11-29 MED ORDER — LISINOPRIL 20 MG PO TABS: 20.0000 mg | ORAL_TABLET | Freq: Every day | ORAL | Status: DC

## 2014-11-29 MED ORDER — OMEPRAZOLE 40 MG PO CPDR: 40.0000 mg | DELAYED_RELEASE_CAPSULE | Freq: Every day | ORAL | Status: DC

## 2014-11-29 NOTE — Progress Notes (Signed)
Subjective:  Patient ID: Gregg Morgan, male    DOB: 05/01/1960  Age: 54 y.o. MRN: [id number]  CC: Annual Exam   HPI Gregg Morgan presents for complete physical examination  follow-up of hypertension. Patient has no history of headache chest pain or shortness of breath or recent cough. Patient also denies symptoms of TIA such as numbness weakness lateralizing. Patient checks  blood pressure at home and has not had any elevated readings recently. Patient denies side effects from his medication. States taking it regularly. Patient in for follow-up of elevated cholesterol. Doing well without complaints on current medication. Denies side effects of statin including myalgia and arthralgia and nausea. Also in today for liver function testing. Currently no chest pain, shortness of breath or other cardiovascular related symptoms noted.  He also has history of obstructive sleep apnea. He is using CPAP regularly. He is treated for his coronary artery disease with medication to suppress his angina.Patient in for follow-up of GERD. Currently asymptomatic taking  PPI daily. There is no chest pain or heartburn. No hematemesis and no melena. No dysphagia or choking. Onset is remote. Progression is stable. Complicating factors, none.   History Gregg Morgan has a past medical history of Hypertension; Hyperlipidemia; Coronary artery disease; Obstructive sleep apnea; and GERD (gastroesophageal reflux disease).   He has past surgical history that includes none; Finger surgery; Cardiac catheterization; and ORIF finger fracture (08/03/2011).   His family history includes Colon cancer in his father.He reports that he has been smoking.  He has never used smokeless tobacco. He reports that he drinks about 1.2 oz of alcohol per week. He reports that he does not use illicit drugs.  Outpatient Prescriptions Prior to Visit  Medication Sig Dispense Refill  . aspirin 81 MG tablet Take 81 mg by mouth at bedtime.     .  Cholecalciferol (VITAMIN D) 2000 UNITS CAPS Take 1 capsule by mouth at bedtime.     . fish oil-omega-3 fatty acids 1000 MG capsule Take 1,000 mg by mouth at bedtime.     Marland Kitchen atorvastatin (LIPITOR) 80 MG tablet Take 1 tablet (80 mg total) by mouth daily. 90 tablet 0  . lisinopril (PRINIVIL,ZESTRIL) 20 MG tablet Take 1 tablet (20 mg total) by mouth daily. 30 tablet 5  . omeprazole (PRILOSEC) 40 MG capsule Take 1 capsule (40 mg total) by mouth daily. 30 capsule 5  . Vitamin D, Ergocalciferol, (DRISDOL) 50000 UNITS CAPS capsule Take 1 capsule (50,000 Units total) by mouth 2 (two) times a week. (Patient not taking: Reported on 11/29/2014) 16 capsule 0   No facility-administered medications prior to visit.    ROS Review of Systems  Constitutional: Negative for fever, chills and diaphoresis.  HENT: Negative for congestion, rhinorrhea and sore throat.   Respiratory: Negative for cough, shortness of breath and wheezing.   Cardiovascular: Negative for chest pain.  Gastrointestinal: Negative for nausea, vomiting, abdominal pain, diarrhea, constipation and abdominal distention.  Genitourinary: Negative for dysuria and frequency.  Musculoskeletal: Negative for joint swelling and arthralgias.  Skin: Negative for rash.  Neurological: Negative for headaches.    Objective:  BP 139/83 mmHg  Pulse 72  Temp(Src) 98 F (36.7 C) (Oral)  Ht _0  (1.753 m)  Wt 190 lb 6.4 oz (86.365 kg)  BMI 28.10 kg/m2  BP Readings from Last 3 Encounters:  11/29/14 139/83  05/02/14 123/91  04/15/14 165/98    Wt Readings from Last 3 Encounters:  11/29/14 190 lb 6.4 oz (86.365 kg)  05/02/14  188 lb 12.8 oz (85.639 kg)  04/15/14 185 lb (83.915 kg)     Physical Exam  Constitutional: He is oriented to person, place, and time. He appears well-developed and well-nourished.  HENT:  Head: Normocephalic and atraumatic.  Mouth/Throat: Oropharynx is clear and moist.  Eyes: EOM are normal. Pupils are equal, round, and  reactive to light.  Neck: Normal range of motion. No tracheal deviation present. No thyromegaly present.  Cardiovascular: Normal rate, regular rhythm and normal heart sounds.  Exam reveals no gallop and no friction rub.   No murmur heard. Pulmonary/Chest: Breath sounds normal. He has no wheezes. He has no rales.  Abdominal: Soft. He exhibits no mass. There is no tenderness.  Musculoskeletal: Normal range of motion. He exhibits no edema.  Neurological: He is alert and oriented to person, place, and time.  Skin: Skin is warm and dry.  Psychiatric: He has a normal mood and affect.    Lab Results  Component Value Date   HGBA1C  10/09/2009    5.6 (NOTE)                                                                       According to the ADA Clinical Practice Recommendations for 2011, when HbA1c is used as a screening test:   >=6.5%   Diagnostic of Diabetes Mellitus           (if abnormal result  is confirmed)  5.7-6.4%   Increased risk of developing Diabetes Mellitus  References:Diagnosis and Classification of Diabetes Mellitus,Diabetes TFTD,3220,25(KYHCW 1):S62-S69 and Standards of Medical Care in         Diabetes - 2011,Diabetes Care,2011,34  (Suppl 1):S11-S61.    Lab Results  Component Value Date   WBC 6.1 11/29/2014   HGB 15.1 02/18/2013   HCT 43.6 11/29/2014   PLT 224 08/01/2011   GLUCOSE 92 11/29/2014   CHOL 193 11/29/2014   TRIG 200* 11/29/2014   HDL 63 11/29/2014   LDLCALC 90 11/29/2014   ALT 50* 11/29/2014   AST 32 11/29/2014   NA 139 11/29/2014   K 4.6 11/29/2014   CL 99 11/29/2014   CREATININE 0.87 11/29/2014   BUN 9 11/29/2014   CO2 26 11/29/2014   TSH 2.210 11/29/2014   PSA 0.7 11/29/2014   INR 1.00 10/09/2009   HGBA1C  10/09/2009    5.6 (NOTE)                                                                       According to the ADA Clinical Practice Recommendations for 2011, when HbA1c is used as a screening test:   >=6.5%   Diagnostic of Diabetes Mellitus            (if abnormal result  is confirmed)  5.7-6.4%   Increased risk of developing Diabetes Mellitus  References:Diagnosis and Classification of Diabetes Mellitus,Diabetes CBJS,2831,51(VOHYW 1):S62-S69 and Standards of Medical Care in         Diabetes - 2011,Diabetes VPXT,0626,94  (  Suppl 1):S11-S61.    Dg Shoulder Left  04/15/2014   CLINICAL DATA:  [place] yesterday, tripping while walking. Fall onto left shoulder with subsequent pain.  EXAM: LEFT SHOULDER - 2+ VIEW  COMPARISON:  Chest radiography 08/01/2011  FINDINGS: There is AC joint separation with widening of the [facility] joint but no elevation of the distal clavicle. No fracture. Glenohumeral joint is normal.  IMPRESSION: AC separation with widening of the joint space but no elevation of the clavicle or fracture.   Electronically Signed   By: Nelson Chimes M.D.   On: 04/15/2014 16:32    Assessment & Plan:   Gregg Morgan was seen today for annual exam.  Diagnoses and all orders for this visit:  Wellness examination -     Cancel: CBC with Differential/Platelet -     CMP14+EGFR -     Lipid panel -     PSA, total and free -     Vit D  25 hydroxy (rtn osteoporosis monitoring) -     TSH -     Hepatitis C antibody -     HIV antibody -     T4, Free -     CBC with Differential/Platelet  Screening -     PSA, total and free -     Hepatitis C antibody -     HIV antibody -     CBC with Differential/Platelet  Hyperlipidemia -     CBC with Differential/Platelet  Essential hypertension -     lisinopril (PRINIVIL,ZESTRIL) 20 MG tablet; Take 1 tablet (20 mg total) by mouth daily. -     CBC with Differential/Platelet  Obstructive sleep apnea on CPAP -     CBC with Differential/Platelet  Coronary artery disease involving native coronary artery of native heart with angina pectoris -     CBC with Differential/Platelet  Gastroesophageal reflux disease without esophagitis -     omeprazole (PRILOSEC) 40 MG capsule; Take 1 capsule (40 mg total) by mouth  daily. -     CBC with Differential/Platelet  Screening for malignant neoplasm of the rectum -     Fecal occult blood, imunochemical  Other orders -     atorvastatin (LIPITOR) 80 MG tablet; Take 1 tablet (80 mg total) by mouth daily. -     Vitamin D, Ergocalciferol, (DRISDOL) 50000 UNITS CAPS capsule; Take 1 capsule twice a week for 2 months   I have discontinued Gregg Morgan's Vitamin D (Ergocalciferol). I am also having him start on Vitamin D (Ergocalciferol). Additionally, I am having him maintain his aspirin, fish oil-omega-3 fatty acids, Vitamin D, atorvastatin, lisinopril, and omeprazole.  Meds ordered this encounter  Medications  . atorvastatin (LIPITOR) 80 MG tablet    Sig: Take 1 tablet (80 mg total) by mouth daily.    Dispense:  90 tablet    Refill:  1  . lisinopril (PRINIVIL,ZESTRIL) 20 MG tablet    Sig: Take 1 tablet (20 mg total) by mouth daily.    Dispense:  30 tablet    Refill:  5  . omeprazole (PRILOSEC) 40 MG capsule    Sig: Take 1 capsule (40 mg total) by mouth daily.    Dispense:  30 capsule    Refill:  5  . Vitamin D, Ergocalciferol, (DRISDOL) 50000 UNITS CAPS capsule    Sig: Take 1 capsule twice a week for 2 months    Dispense:  16 capsule    Refill:  0     Follow-up: No Follow-up on file.  Agusta Hackenberg, M.D.  

## 2014-11-30 LAB — CMP14+EGFR
ALT: 50 IU/L — AB (ref 0–44)
AST: 32 IU/L (ref 0–40)
Albumin/Globulin Ratio: 1.8 (ref 1.1–2.5)
Albumin: 4.7 g/dL (ref 3.5–5.5)
Alkaline Phosphatase: 103 IU/L (ref 39–117)
BUN/Creatinine Ratio: 10 (ref 9–20)
BUN: 9 mg/dL (ref 6–24)
Bilirubin Total: 0.6 mg/dL (ref 0.0–1.2)
CALCIUM: 9.8 mg/dL (ref 8.7–10.2)
CO2: 26 mmol/L (ref 18–29)
Chloride: 99 mmol/L (ref 97–108)
Creatinine, Ser: 0.87 mg/dL (ref 0.76–1.27)
GFR calc Af Amer: 113 mL/min/{1.73_m2} (ref >59)
GFR, EST NON AFRICAN AMERICAN: 98 mL/min/{1.73_m2} (ref >59)
Globulin, Total: 2.6 g/dL (ref 1.5–4.5)
Glucose: 92 mg/dL (ref 65–99)
Potassium: 4.6 mmol/L (ref 3.5–5.2)
Sodium: 139 mmol/L (ref 134–144)
Total Protein: 7.3 g/dL (ref 6.0–8.5)

## 2014-11-30 LAB — CBC WITH DIFFERENTIAL/PLATELET
BASOS: 1 %
Basophils Absolute: 0.1 10*3/uL (ref 0.0–0.2)
EOS (ABSOLUTE): 0.1 10*3/uL (ref 0.0–0.4)
Eos: 2 %
Hematocrit: 43.6 % (ref 37.5–51.0)
Hemoglobin: 15 g/dL (ref 12.6–17.7)
IMMATURE GRANULOCYTES: 0 %
Immature Grans (Abs): 0 10*3/uL (ref 0.0–0.1)
Lymphocytes Absolute: 1.8 10*3/uL (ref 0.7–3.1)
Lymphs: 30 %
MCH: 31.6 pg (ref 26.6–33.0)
MCHC: 34.4 g/dL (ref 31.5–35.7)
MCV: 92 fL (ref 79–97)
MONOS ABS: 0.5 10*3/uL (ref 0.1–0.9)
Monocytes: 8 %
NEUTROS PCT: 59 %
Neutrophils Absolute: 3.6 10*3/uL (ref 1.4–7.0)
PLATELETS: 260 10*3/uL (ref 150–379)
RBC: 4.74 x10E6/uL (ref 4.14–5.80)
RDW: 14 % (ref 12.3–15.4)
WBC: 6.1 10*3/uL (ref 3.4–10.8)

## 2014-11-30 LAB — LIPID PANEL
CHOL/HDL RATIO: 3.1 ratio (ref 0.0–5.0)
Cholesterol, Total: 193 mg/dL (ref 100–199)
HDL: 63 mg/dL (ref >39)
LDL Calculated: 90 mg/dL (ref 0–99)
TRIGLYCERIDES: 200 mg/dL — AB (ref 0–149)
VLDL CHOLESTEROL CAL: 40 mg/dL (ref 5–40)

## 2014-11-30 LAB — PSA, TOTAL AND FREE
PSA, Free Pct: 35.7 %
PSA, Free: 0.25 ng/mL
Prostate Specific Ag, Serum: 0.7 ng/mL (ref 0.0–4.0)

## 2014-11-30 LAB — HEPATITIS C ANTIBODY

## 2014-11-30 LAB — HIV ANTIBODY (ROUTINE TESTING W REFLEX): HIV SCREEN 4TH GENERATION: NONREACTIVE

## 2014-11-30 LAB — VITAMIN D 25 HYDROXY (VIT D DEFICIENCY, FRACTURES): Vit D, 25-Hydroxy: 29.9 ng/mL — ABNORMAL LOW (ref 30.0–100.0)

## 2014-11-30 LAB — TSH: TSH: 2.21 u[IU]/mL (ref 0.450–4.500)

## 2014-11-30 LAB — T4, FREE: Free T4: 1.17 ng/dL (ref 0.82–1.77)

## 2014-11-30 MED ORDER — VITAMIN D (ERGOCALCIFEROL) 1.25 MG (50000 UNIT) PO CAPS: ORAL_CAPSULE | ORAL | Status: DC

## 2014-12-01 LAB — FECAL OCCULT BLOOD, IMMUNOCHEMICAL: Fecal Occult Bld: NEGATIVE

## 2014-12-09 ENCOUNTER — Other Ambulatory Visit: Payer: Self-pay | Admitting: Family Medicine

## 2015-06-08 ENCOUNTER — Other Ambulatory Visit: Payer: Self-pay | Admitting: Family Medicine

## 2015-07-04 ENCOUNTER — Ambulatory Visit: Payer: BLUE CROSS/BLUE SHIELD | Admitting: Family Medicine

## 2015-07-05 ENCOUNTER — Encounter: Payer: Self-pay | Admitting: Family Medicine

## 2015-07-10 ENCOUNTER — Ambulatory Visit: Payer: BLUE CROSS/BLUE SHIELD | Admitting: Family Medicine

## 2015-07-14 ENCOUNTER — Encounter: Payer: Self-pay | Admitting: Family Medicine

## 2015-07-14 ENCOUNTER — Ambulatory Visit (INDEPENDENT_AMBULATORY_CARE_PROVIDER_SITE_OTHER): Payer: BLUE CROSS/BLUE SHIELD | Admitting: Family Medicine

## 2015-07-14 VITALS — BP 123/78 | HR 78 | Temp 97.8°F | Ht 69.0 in | Wt 190.2 lb

## 2015-07-14 DIAGNOSIS — E785 Hyperlipidemia, unspecified: Secondary | ICD-10-CM | POA: Diagnosis not present

## 2015-07-14 DIAGNOSIS — K219 Gastro-esophageal reflux disease without esophagitis: Secondary | ICD-10-CM

## 2015-07-14 DIAGNOSIS — Z9989 Dependence on other enabling machines and devices: Secondary | ICD-10-CM

## 2015-07-14 DIAGNOSIS — I25119 Atherosclerotic heart disease of native coronary artery with unspecified angina pectoris: Secondary | ICD-10-CM

## 2015-07-14 DIAGNOSIS — G4733 Obstructive sleep apnea (adult) (pediatric): Secondary | ICD-10-CM | POA: Diagnosis not present

## 2015-07-14 DIAGNOSIS — I10 Essential (primary) hypertension: Secondary | ICD-10-CM | POA: Diagnosis not present

## 2015-07-14 NOTE — Progress Notes (Signed)
Subjective:  Patient ID: Gregg Morgan, male    DOB: 01/11/1961  Age: 55 y.o. MRN: [id number]  CC: Hyperlipidemia; Hypertension; and Gastroesophageal Reflux   HPI Gregg Morgan presents for  follow-up of hypertension. Patient has no history of headache chest pain or shortness of breath or recent cough. Patient also denies symptoms of TIA such as numbness weakness lateralizing. Patient checks  blood pressure at home and has not had any elevated readings recently. Patient denies side effects from his medication. States taking it regularly.  Patient also  in for follow-up of elevated cholesterol. Doing well without complaints on current medication. Denies side effects of statin including myalgia and arthralgia and nausea. Also in today for liver function testing. Currently no chest pain, shortness of breath or other cardiovascular related symptoms noted.  Patient in for follow-up of GERD. Currently asymptomatic taking  PPI daily. There is no chest pain or heartburn. No hematemesis and no melena. No dysphagia or choking. Onset is remote. Progression is stable. Complicating factors, none.   History Gregg Morgan has a past medical history of Hypertension; Hyperlipidemia; Coronary artery disease; Obstructive sleep apnea; and GERD (gastroesophageal reflux disease).   He has past surgical history that includes none; Finger surgery; Cardiac catheterization; and ORIF finger fracture (08/03/2011).   His family history includes Colon cancer in his father.He reports that he has been smoking.  He has never used smokeless tobacco. He reports that he drinks about 1.2 oz of alcohol per week. He reports that he does not use illicit drugs.  Current Outpatient Prescriptions on File Prior to Visit  Medication Sig Dispense Refill  . aspirin 81 MG tablet Take 81 mg by mouth at bedtime.     Marland Kitchen atorvastatin (LIPITOR) 80 MG tablet Take 1 tablet (80 mg total) by mouth daily. 90 tablet 1  . Cholecalciferol (VITAMIN D) 2000  UNITS CAPS Take 1 capsule by mouth at bedtime.     . fish oil-omega-3 fatty acids 1000 MG capsule Take 1,000 mg by mouth at bedtime.     Marland Kitchen lisinopril (PRINIVIL,ZESTRIL) 20 MG tablet TAKE 1 TABLET BY MOUTH EVERY DAY 30 tablet 0  . omeprazole (PRILOSEC) 40 MG capsule TAKE 1 CAPSULE (40 MG TOTAL) BY MOUTH DAILY. 30 capsule 0   No current facility-administered medications on file prior to visit.    ROS Review of Systems  Constitutional: Negative for fever, chills, diaphoresis and unexpected weight change.  HENT: Negative for congestion, hearing loss, rhinorrhea and sore throat.   Eyes: Negative for visual disturbance.  Respiratory: Negative for cough and shortness of breath.   Cardiovascular: Negative for chest pain.  Gastrointestinal: Negative for abdominal pain, diarrhea and constipation.  Genitourinary: Negative for dysuria and flank pain.  Musculoskeletal: Negative for joint swelling and arthralgias.  Skin: Negative for rash.  Neurological: Negative for dizziness and headaches.  Psychiatric/Behavioral: Negative for sleep disturbance and dysphoric mood.    Objective:  BP 123/78 mmHg  Pulse 78  Temp(Src) 97.8 F (36.6 C) (Oral)  Ht  (1.753 m)  Wt 190 lb 3.2 oz (86.274 kg)  BMI 28.07 kg/m2  SpO2 98%  BP Readings from Last 3 Encounters:  07/14/15 123/78  11/29/14 139/83  05/02/14 123/91    Wt Readings from Last 3 Encounters:  07/14/15 190 lb 3.2 oz (86.274 kg)  11/29/14 190 lb 6.4 oz (86.365 kg)  05/02/14 188 lb 12.8 oz (85.639 kg)     Physical Exam  Constitutional: He is oriented to person, place, and time.  He appears well-developed and well-nourished. No distress.  HENT:  Head: Normocephalic and atraumatic.  Right Ear: External ear normal.  Left Ear: External ear normal.  Nose: Nose normal.  Mouth/Throat: Oropharynx is clear and moist.  Eyes: Conjunctivae and EOM are normal. Pupils are equal, round, and reactive to light.  Neck: Normal range of motion. Neck  supple. No thyromegaly present.  Cardiovascular: Normal rate, regular rhythm and normal heart sounds.   No murmur heard. Pulmonary/Chest: Effort normal and breath sounds normal. No respiratory distress. He has no wheezes. He has no rales.  Abdominal: Soft. Bowel sounds are normal. He exhibits no distension. There is no tenderness.  Lymphadenopathy:    He has no cervical adenopathy.  Neurological: He is alert and oriented to person, place, and time. He has normal reflexes.  Skin: Skin is warm and dry.  Psychiatric: He has a normal mood and affect. His behavior is normal. Judgment and thought content normal.    Lab Results  Component Value Date   HGBA1C  10/09/2009    5.6 (NOTE)                                                                       According to the ADA Clinical Practice Recommendations for 2011, when HbA1c is used as a screening test:   >=6.5%   Diagnostic of Diabetes Mellitus           (if abnormal result  is confirmed)  5.7-6.4%   Increased risk of developing Diabetes Mellitus  References:Diagnosis and Classification of Diabetes Mellitus,Diabetes Care,2011,34(Suppl 1):S62-S69 and Standards of Medical Care in         Diabetes - 2011,Diabetes Care,2011,34  (Suppl 1):S11-S61.    Lab Results  Component Value Date   WBC 6.1 11/29/2014   HGB 15.1 02/18/2013   HCT 43.6 11/29/2014   PLT 260 11/29/2014   GLUCOSE 92 11/29/2014   CHOL 193 11/29/2014   TRIG 200* 11/29/2014   HDL 63 11/29/2014   LDLCALC 90 11/29/2014   ALT 50* 11/29/2014   AST 32 11/29/2014   NA 139 11/29/2014   K 4.6 11/29/2014   CL 99 11/29/2014   CREATININE 0.87 11/29/2014   BUN 9 11/29/2014   CO2 26 11/29/2014   TSH 2.210 11/29/2014   PSA 0.7 02/18/2013   INR 1.00 10/09/2009   HGBA1C  10/09/2009    5.6 (NOTE)                                                                       According to the ADA Clinical Practice Recommendations for 2011, when HbA1c is used as a screening test:   >=6.5%    Diagnostic of Diabetes Mellitus           (if abnormal result  is confirmed)  5.7-6.4%   Increased risk of developing Diabetes Mellitus  References:Diagnosis and Classification of Diabetes Mellitus,Diabetes Care,2011,34(Suppl 1):S62-S69 and Standards of Medical Care in         Diabetes -  2011,Diabetes Care,2011,34  (Suppl 1):S11-S61.    Dg Shoulder Left  04/15/2014  CLINICAL DATA:  [place] yesterday, tripping while walking. Fall onto left shoulder with subsequent pain. EXAM: LEFT SHOULDER - 2+ VIEW COMPARISON:  Chest radiography 08/01/2011 FINDINGS: There is AC joint separation with widening of the [facility] joint but no elevation of the distal clavicle. No fracture. Glenohumeral joint is normal. IMPRESSION: AC separation with widening of the joint space but no elevation of the clavicle or fracture. Electronically Signed   By: Paulina Fusi M.D.   On: 04/15/2014 16:32    Assessment & Plan:   Chamar was seen today for hyperlipidemia, hypertension and gastroesophageal reflux.  Diagnoses and all orders for this visit:  Essential hypertension  Hyperlipidemia  Gastroesophageal reflux disease without esophagitis  Coronary artery disease involving native coronary artery of native heart with angina pectoris (HCC)  Obstructive sleep apnea on CPAP   I have discontinued Mr. Cleaves's Vitamin D (Ergocalciferol). I am also having him maintain his aspirin, fish oil-omega-3 fatty acids, Vitamin D, atorvastatin, lisinopril, and omeprazole.  No orders of the defined types were placed in this encounter.     Follow-up: Return in about 6 months (around 01/13/2016).  Mechele Claude, M.D.

## 2015-07-15 LAB — CBC WITH DIFFERENTIAL/PLATELET
BASOS ABS: 0.1 10*3/uL (ref 0.0–0.2)
BASOS: 1 %
EOS (ABSOLUTE): 0.1 10*3/uL (ref 0.0–0.4)
Eos: 2 %
Hematocrit: 43.7 % (ref 37.5–51.0)
Hemoglobin: 14.9 g/dL (ref 12.6–17.7)
IMMATURE GRANULOCYTES: 0 %
Immature Grans (Abs): 0 10*3/uL (ref 0.0–0.1)
LYMPHS: 29 %
Lymphocytes Absolute: 1.7 10*3/uL (ref 0.7–3.1)
MCH: 31.6 pg (ref 26.6–33.0)
MCHC: 34.1 g/dL (ref 31.5–35.7)
MCV: 93 fL (ref 79–97)
MONOS ABS: 0.5 10*3/uL (ref 0.1–0.9)
Monocytes: 9 %
NEUTROS ABS: 3.4 10*3/uL (ref 1.4–7.0)
NEUTROS PCT: 59 %
PLATELETS: 212 10*3/uL (ref 150–379)
RBC: 4.71 x10E6/uL (ref 4.14–5.80)
RDW: 13.3 % (ref 12.3–15.4)
WBC: 5.8 10*3/uL (ref 3.4–10.8)

## 2015-07-15 LAB — CMP14+EGFR
ALK PHOS: 91 IU/L (ref 39–117)
ALT: 42 IU/L (ref 0–44)
AST: 27 IU/L (ref 0–40)
Albumin/Globulin Ratio: 2 (ref 1.2–2.2)
Albumin: 4.7 g/dL (ref 3.5–5.5)
BUN/Creatinine Ratio: 14 (ref 9–20)
BUN: 12 mg/dL (ref 6–24)
Bilirubin Total: 0.5 mg/dL (ref 0.0–1.2)
CALCIUM: 9.5 mg/dL (ref 8.7–10.2)
CO2: 23 mmol/L (ref 18–29)
CREATININE: 0.84 mg/dL (ref 0.76–1.27)
Chloride: 100 mmol/L (ref 96–106)
GFR calc Af Amer: 114 mL/min/{1.73_m2} (ref >59)
GFR, EST NON AFRICAN AMERICAN: 99 mL/min/{1.73_m2} (ref >59)
GLUCOSE: 94 mg/dL (ref 65–99)
Globulin, Total: 2.4 g/dL (ref 1.5–4.5)
POTASSIUM: 4.3 mmol/L (ref 3.5–5.2)
Sodium: 141 mmol/L (ref 134–144)
Total Protein: 7.1 g/dL (ref 6.0–8.5)

## 2015-07-15 LAB — LIPID PANEL
CHOL/HDL RATIO: 3.9 ratio (ref 0.0–5.0)
CHOLESTEROL TOTAL: 197 mg/dL (ref 100–199)
HDL: 51 mg/dL (ref >39)
LDL CALC: 115 mg/dL — AB (ref 0–99)
TRIGLYCERIDES: 155 mg/dL — AB (ref 0–149)
VLDL Cholesterol Cal: 31 mg/dL (ref 5–40)

## 2015-07-16 ENCOUNTER — Other Ambulatory Visit: Payer: Self-pay | Admitting: Family Medicine

## 2015-07-19 ENCOUNTER — Encounter: Payer: Self-pay | Admitting: *Deleted

## 2015-08-16 ENCOUNTER — Other Ambulatory Visit: Payer: Self-pay | Admitting: Family Medicine

## 2015-11-19 ENCOUNTER — Other Ambulatory Visit: Payer: Self-pay | Admitting: Family Medicine

## 2015-12-16 ENCOUNTER — Other Ambulatory Visit: Payer: Self-pay | Admitting: Family Medicine

## 2016-01-10 ENCOUNTER — Ambulatory Visit (INDEPENDENT_AMBULATORY_CARE_PROVIDER_SITE_OTHER): Payer: BLUE CROSS/BLUE SHIELD | Admitting: Family Medicine

## 2016-01-10 ENCOUNTER — Encounter: Payer: Self-pay | Admitting: Family Medicine

## 2016-01-10 VITALS — BP 131/88 | HR 73 | Temp 98.0°F | Ht 69.0 in | Wt 188.2 lb

## 2016-01-10 DIAGNOSIS — L209 Atopic dermatitis, unspecified: Secondary | ICD-10-CM | POA: Diagnosis not present

## 2016-01-10 MED ORDER — TRIAMCINOLONE ACETONIDE 0.1 % EX CREA: 1.0000 "application " | TOPICAL_CREAM | Freq: Two times a day (BID) | CUTANEOUS | 0 refills | Status: DC

## 2016-01-10 NOTE — Progress Notes (Signed)
BP 131/88   Pulse 73   Temp 98 F (36.7 C) (Oral)   Ht 5\' 9"  (1.753 m)   Wt 188 lb 4 oz (85.4 kg)   BMI 27.80 kg/m    Subjective:    Patient ID: Gregg Morgan, male    DOB: 27-Jun-1960, 55 y.o.   MRN: [id number]  HPI: Gregg Morgan is a 55 y.o. male presenting on 01/10/2016 for Rash (itchy rash all over legs, groin area; recently in woods)   HPI Rash Multiple spots of rash that are very pruritic and are on his legs is groin and his buttocks. He was out in the woods recently and thinks that he may have gotten attacked by some kind of chiggers. He never saw what bit him. He denies any fevers or chills or myalgias or arthralgias. He does not have any redness or warmth or purulent drainage from any sites. They're mostly just small bites scattered on his body.  Relevant past medical, surgical, family and social history reviewed and updated as indicated. Interim medical history since our last visit reviewed. Allergies and medications reviewed and updated.  Review of Systems  Constitutional: Negative for chills and fever.  Eyes: Negative for discharge.  Respiratory: Negative for shortness of breath and wheezing.   Cardiovascular: Negative for chest pain and leg swelling.  Musculoskeletal: Negative for back pain and gait problem.  Skin: Positive for rash.  All other systems reviewed and are negative.   Per HPI unless specifically indicated above     Medication List       Accurate as of 01/10/16  6:31 PM. Always use your most recent med list.          aspirin 81 MG tablet Take 81 mg by mouth at bedtime.   atorvastatin 80 MG tablet Commonly known as:  LIPITOR TAKE 1 TABLET (80 MG TOTAL) BY MOUTH DAILY.   fish oil-omega-3 fatty acids 1000 MG capsule Take 1,000 mg by mouth at bedtime.   lisinopril 20 MG tablet Commonly known as:  PRINIVIL,ZESTRIL TAKE 1 TABLET BY MOUTH EVERY DAY   omeprazole 40 MG capsule Commonly known as:  PRILOSEC TAKE 1 CAPSULE (40 MG TOTAL)  BY MOUTH DAILY.   triamcinolone cream 0.1 % Commonly known as:  KENALOG Apply 1 application topically 2 (two) times daily.   Vitamin D 2000 units Caps Take 1 capsule by mouth at bedtime.          Objective:    BP 131/88   Pulse 73   Temp 98 F (36.7 C) (Oral)   Ht 5\' 9"  (1.753 m)   Wt 188 lb 4 oz (85.4 kg)   BMI 27.80 kg/m   Wt Readings from Last 3 Encounters:  01/10/16 188 lb 4 oz (85.4 kg)  07/14/15 190 lb 3.2 oz (86.3 kg)  11/29/14 190 lb 6.4 oz (86.4 kg)    Physical Exam  Constitutional: He is oriented to person, place, and time. He appears well-developed and well-nourished. No distress.  Eyes: Conjunctivae are normal. Right eye exhibits no discharge. Left eye exhibits no discharge. No scleral icterus.  Cardiovascular: Normal rate, regular rhythm, normal heart sounds and intact distal pulses.   No murmur heard. Pulmonary/Chest: Effort normal and breath sounds normal. No respiratory distress. He has no wheezes.  Musculoskeletal: Normal range of motion. He exhibits no edema.  Neurological: He is alert and oriented to person, place, and time. Coordination normal.  Skin: Skin is warm and dry. Rash noted. Rash is  papular (Small pink papules scattered on lower legs suprapubic region and buttocks. They come in a few clusters with no purulent drainage or erythema or warmth. Have the appearance of arthropod bites). He is not diaphoretic.  Psychiatric: He has a normal mood and affect. His behavior is normal.  Nursing note and vitals reviewed.     Assessment & Plan:   Problem List Items Addressed This Visit    None    Visit Diagnoses    Atopic dermatitis    -  Primary   Likely from chigger bites, use Benadryl and topical triamcinolone   Relevant Medications   triamcinolone cream (KENALOG) 0.1 %       Follow up plan: Return if symptoms worsen or fail to improve.  Counseling provided for all of the vaccine components No orders of the defined types were placed in this  encounter.   Arville CareJoshua Dettinger, MD [facility] 01/10/2016, 6:31 PM

## 2016-01-14 ENCOUNTER — Other Ambulatory Visit: Payer: Self-pay | Admitting: Family Medicine

## 2016-01-15 ENCOUNTER — Ambulatory Visit (INDEPENDENT_AMBULATORY_CARE_PROVIDER_SITE_OTHER): Payer: BLUE CROSS/BLUE SHIELD | Admitting: Family Medicine

## 2016-01-15 ENCOUNTER — Encounter: Payer: Self-pay | Admitting: Family Medicine

## 2016-01-15 VITALS — BP 121/79 | HR 71 | Temp 97.6°F | Ht 69.0 in | Wt 191.0 lb

## 2016-01-15 DIAGNOSIS — I10 Essential (primary) hypertension: Secondary | ICD-10-CM | POA: Diagnosis not present

## 2016-01-15 DIAGNOSIS — F17200 Nicotine dependence, unspecified, uncomplicated: Secondary | ICD-10-CM

## 2016-01-15 DIAGNOSIS — K219 Gastro-esophageal reflux disease without esophagitis: Secondary | ICD-10-CM | POA: Diagnosis not present

## 2016-01-15 DIAGNOSIS — E785 Hyperlipidemia, unspecified: Secondary | ICD-10-CM

## 2016-01-15 MED ORDER — VARENICLINE TARTRATE 0.5 MG X 11 & 1 MG X 42 PO MISC: ORAL | 0 refills | Status: DC

## 2016-01-15 MED ORDER — PANTOPRAZOLE SODIUM 40 MG PO TBEC
40.0000 mg | DELAYED_RELEASE_TABLET | Freq: Every day | ORAL | 3 refills | Status: DC
Start: 2016-01-15 — End: 2016-05-22

## 2016-01-15 MED ORDER — LISINOPRIL 20 MG PO TABS: 20.0000 mg | ORAL_TABLET | Freq: Every day | ORAL | 2 refills | Status: DC

## 2016-01-15 NOTE — Progress Notes (Signed)
Subjective:  Patient ID: Gregg Morgan, male    DOB: 02/06/1961  Age: 55 y.o. MRN: [id number]  CC: Hyperlipidemia (pt here for routine follow up on cholesterol, he is having really bad muscle cramps with the atorvastatin)   HPI [name] presents for  follow-up of hypertension. Patient has no history of headache chest pain or shortness of breath or recent cough. Patient also denies symptoms of TIA such as numbness weakness lateralizing. Patient checks  blood pressure at home and has not had any elevated readings recently. Patient denies side effects from his medication. States taking it regularly.  Patient also  in for follow-up of elevated cholesterol.Stiffness and muscle pain on current medication. Reports side effects of statin including myalgia all over lasting until lunchtime daily. No arthralgia and nausea. Also in today for liver function testing. Currently no chest pain, shortness of breath or other cardiovascular related symptoms noted.  Patient in for follow-up of GERD. Currently asymptomatic taking  PPI daily. There is no chest pain or heartburn. No hematemesis and no melena. No dysphagia or choking. Onset is remote. Progression is stable. Complicating factors, none.Omeprazole co-pays, up to $45. I suggested switching to pantoprazole to save money. He thinks the co-pay will be less based on previous experience.   History Dev has a past medical history of Coronary artery disease; GERD (gastroesophageal reflux disease); Hyperlipidemia; Hypertension; and Obstructive sleep apnea.   He has a past surgical history that includes none; Finger surgery; Cardiac catheterization; and ORIF finger fracture (08/03/2011).   His family history includes Colon cancer in his father.He reports that he has been smoking.  He has a 10.00 pack-year smoking history. He has never used smokeless tobacco. He reports that he drinks about 1.2 oz of alcohol per week . He reports that he does not use  drugs.  Current Outpatient Prescriptions on File Prior to Visit  Medication Sig Dispense Refill  . aspirin 81 MG tablet Take 81 mg by mouth at bedtime.     . Cholecalciferol (VITAMIN D) 2000 UNITS CAPS Take 1 capsule by mouth at bedtime.     . fish oil-omega-3 fatty acids 1000 MG capsule Take 1,000 mg by mouth at bedtime.     . triamcinolone cream (KENALOG) 0.1 % Apply 1 application topically 2 (two) times daily. 80 g 0   No current facility-administered medications on file prior to visit.     ROS Review of Systems  Constitutional: Negative for chills, diaphoresis, fever and unexpected weight change.  HENT: Negative for congestion, hearing loss, rhinorrhea and sore throat.   Eyes: Negative for visual disturbance.  Respiratory: Negative for cough and shortness of breath.   Cardiovascular: Negative for chest pain.  Gastrointestinal: Negative for abdominal pain, constipation and diarrhea.  Genitourinary: Negative for dysuria and flank pain.  Musculoskeletal: Positive for myalgias. Negative for arthralgias and joint swelling.  Skin: Negative for rash.  Neurological: Negative for dizziness and headaches.  Psychiatric/Behavioral: Negative for dysphoric mood and sleep disturbance.    Objective:  BP 121/79   Pulse 71   Temp 97.6 F (36.4 C) (Oral)   Ht '5\' 9"'  (1.753 m)   Wt 191 lb (86.6 kg)   BMI 28.21 kg/m   BP Readings from Last 3 Encounters:  01/15/16 121/79  01/10/16 131/88  07/14/15 123/78    Wt Readings from Last 3 Encounters:  01/15/16 191 lb (86.6 kg)  01/10/16 188 lb 4 oz (85.4 kg)  07/14/15 190 lb 3.2 oz (86.3 kg)  Physical Exam  Constitutional: He is oriented to person, place, and time. He appears well-developed and well-nourished. No distress.  HENT:  Head: Normocephalic and atraumatic.  Right Ear: External ear normal.  Left Ear: External ear normal.  Nose: Nose normal.  Mouth/Throat: Oropharynx is clear and moist.  Eyes: Conjunctivae and EOM are  normal. Pupils are equal, round, and reactive to light.  Neck: Normal range of motion. Neck supple. No thyromegaly present.  Cardiovascular: Normal rate, regular rhythm and normal heart sounds.   No murmur heard. Pulmonary/Chest: Effort normal and breath sounds normal. No respiratory distress. He has no wheezes. He has no rales.  Abdominal: Soft. Bowel sounds are normal. He exhibits no distension. There is no tenderness.  Lymphadenopathy:    He has no cervical adenopathy.  Neurological: He is alert and oriented to person, place, and time. He has normal reflexes.  Skin: Skin is warm and dry.  Psychiatric: He has a normal mood and affect. His behavior is normal. Judgment and thought content normal.    Lab Results  Component Value Date   HGBA1C  10/09/2009    5.6 (NOTE)                                                                       According to the ADA Clinical Practice Recommendations for 2011, when HbA1c is used as a screening test:   >=6.5%   Diagnostic of Diabetes Mellitus           (if abnormal result  is confirmed)  5.7-6.4%   Increased risk of developing Diabetes Mellitus  References:Diagnosis and Classification of Diabetes Mellitus,Diabetes JGOT,1572,62(MBTDH 1):S62-S69 and Standards of Medical Care in         Diabetes - 2011,Diabetes RCBU,3845,36  (Suppl 1):S11-S61.    Lab Results  Component Value Date   WBC 5.8 07/14/2015   HGB 15.1 02/18/2013   HCT 43.7 07/14/2015   PLT 212 07/14/2015   GLUCOSE 94 07/14/2015   CHOL 197 07/14/2015   TRIG 155 (H) 07/14/2015   HDL 51 07/14/2015   LDLCALC 115 (H) 07/14/2015   ALT 42 07/14/2015   AST 27 07/14/2015   NA 141 07/14/2015   K 4.3 07/14/2015   CL 100 07/14/2015   CREATININE 0.84 07/14/2015   BUN 12 07/14/2015   CO2 23 07/14/2015   TSH 2.210 11/29/2014   PSA 0.7 02/18/2013   INR 1.00 10/09/2009   HGBA1C  10/09/2009    5.6 (NOTE)                                                                       According to the ADA  Clinical Practice Recommendations for 2011, when HbA1c is used as a screening test:   >=6.5%   Diagnostic of Diabetes Mellitus           (if abnormal result  is confirmed)  5.7-6.4%   Increased risk of developing Diabetes Mellitus  References:Diagnosis and Classification of Diabetes Mellitus,Diabetes Care,2011,34(Suppl 1):S62-S69 and  Standards of Medical Care in         Diabetes - 2011,Diabetes ZOXW,9604,54  (Suppl 1):S11-S61.    Dg Shoulder Left  Result Date: 04/15/2014 CLINICAL DATA:  [place] yesterday, tripping while walking. Fall onto left shoulder with subsequent pain. EXAM: LEFT SHOULDER - 2+ VIEW COMPARISON:  Chest radiography 08/01/2011 FINDINGS: There is AC joint separation with widening of the [facility] joint but no elevation of the distal clavicle. No fracture. Glenohumeral joint is normal. IMPRESSION: AC separation with widening of the joint space but no elevation of the clavicle or fracture. Electronically Signed   By: Nelson Chimes M.D.   On: 04/15/2014 16:32    Assessment & Plan:   Overton was seen today for hyperlipidemia.  Diagnoses and all orders for this visit:  Essential hypertension -     lisinopril (PRINIVIL,ZESTRIL) 20 MG tablet; Take 1 tablet (20 mg total) by mouth daily. -     CBC with Differential/Platelet -     CMP14+EGFR  Hyperlipidemia, unspecified hyperlipidemia type -     CBC with Differential/Platelet -     CMP14+EGFR -     Lipid panel  Gastroesophageal reflux disease without esophagitis -     pantoprazole (PROTONIX) 40 MG tablet; Take 1 tablet (40 mg total) by mouth daily. -     CBC with Differential/Platelet -     CMP14+EGFR  Tobacco use disorder -     varenicline (CHANTIX STARTING MONTH PAK) 0.5 MG X 11 & 1 MG X 42 tablet; Follow package directions   I have discontinued Mr. Valcarcel's atorvastatin and omeprazole. I have also changed his lisinopril. Additionally, I am having him start on pantoprazole and varenicline. Lastly, I am having him maintain his aspirin,  fish oil-omega-3 fatty acids, Vitamin D, and triamcinolone cream.  Meds ordered this encounter  Medications  . pantoprazole (PROTONIX) 40 MG tablet    Sig: Take 1 tablet (40 mg total) by mouth daily.    Dispense:  30 tablet    Refill:  3  . varenicline (CHANTIX STARTING MONTH PAK) 0.5 MG X 11 & 1 MG X 42 tablet    Sig: Follow package directions    Dispense:  53 tablet    Refill:  0  . lisinopril (PRINIVIL,ZESTRIL) 20 MG tablet    Sig: Take 1 tablet (20 mg total) by mouth daily.    Dispense:  90 tablet    Refill:  2     Follow-up: Return in about 6 months (around 07/15/2016) for Wellness, hypertension, cholesterol.  Claretta Fraise, M.D.

## 2016-01-16 LAB — CBC WITH DIFFERENTIAL/PLATELET
BASOS ABS: 0.1 10*3/uL (ref 0.0–0.2)
Basos: 1 %
EOS (ABSOLUTE): 0.1 10*3/uL (ref 0.0–0.4)
EOS: 2 %
HEMATOCRIT: 44.2 % (ref 37.5–51.0)
HEMOGLOBIN: 15.1 g/dL (ref 12.6–17.7)
IMMATURE GRANS (ABS): 0 10*3/uL (ref 0.0–0.1)
Immature Granulocytes: 0 %
LYMPHS ABS: 1.7 10*3/uL (ref 0.7–3.1)
LYMPHS: 26 %
MCH: 31.7 pg (ref 26.6–33.0)
MCHC: 34.2 g/dL (ref 31.5–35.7)
MCV: 93 fL (ref 79–97)
MONOCYTES: 9 %
Monocytes Absolute: 0.6 10*3/uL (ref 0.1–0.9)
NEUTROS ABS: 4 10*3/uL (ref 1.4–7.0)
Neutrophils: 62 %
Platelets: 238 10*3/uL (ref 150–379)
RBC: 4.77 x10E6/uL (ref 4.14–5.80)
RDW: 13.2 % (ref 12.3–15.4)
WBC: 6.4 10*3/uL (ref 3.4–10.8)

## 2016-01-16 LAB — LIPID PANEL
CHOL/HDL RATIO: 3.3 ratio (ref 0.0–5.0)
Cholesterol, Total: 182 mg/dL (ref 100–199)
HDL: 56 mg/dL (ref >39)
LDL CALC: 92 mg/dL (ref 0–99)
Triglycerides: 168 mg/dL — ABNORMAL HIGH (ref 0–149)
VLDL CHOLESTEROL CAL: 34 mg/dL (ref 5–40)

## 2016-01-16 LAB — CMP14+EGFR
ALBUMIN: 4.7 g/dL (ref 3.5–5.5)
ALK PHOS: 97 IU/L (ref 39–117)
ALT: 34 IU/L (ref 0–44)
AST: 23 IU/L (ref 0–40)
Albumin/Globulin Ratio: 2 (ref 1.2–2.2)
BUN / CREAT RATIO: 10 (ref 9–20)
BUN: 8 mg/dL (ref 6–24)
Bilirubin Total: 0.5 mg/dL (ref 0.0–1.2)
CO2: 25 mmol/L (ref 18–29)
CREATININE: 0.84 mg/dL (ref 0.76–1.27)
Calcium: 9.6 mg/dL (ref 8.7–10.2)
Chloride: 98 mmol/L (ref 96–106)
GFR calc non Af Amer: 99 mL/min/{1.73_m2} (ref >59)
GFR, EST AFRICAN AMERICAN: 114 mL/min/{1.73_m2} (ref >59)
GLOBULIN, TOTAL: 2.4 g/dL (ref 1.5–4.5)
GLUCOSE: 87 mg/dL (ref 65–99)
Potassium: 4.2 mmol/L (ref 3.5–5.2)
SODIUM: 139 mmol/L (ref 134–144)
TOTAL PROTEIN: 7.1 g/dL (ref 6.0–8.5)

## 2016-01-29 ENCOUNTER — Telehealth: Payer: Self-pay | Admitting: Family Medicine

## 2016-01-29 ENCOUNTER — Other Ambulatory Visit: Payer: Self-pay | Admitting: Family Medicine

## 2016-01-29 MED ORDER — ROSUVASTATIN CALCIUM 5 MG PO TABS
5.0000 mg | ORAL_TABLET | Freq: Every day | ORAL | 5 refills | Status: DC
Start: 2016-01-29 — End: 2016-07-15

## 2016-01-29 NOTE — Telephone Encounter (Signed)
Please contact the patient Crestor is usually far better tolerated than what he's taken before. Also a low-dose of that may be easier to tolerate and still be effective. I'll send in a prescription for him. Please let the patient know.

## 2016-01-30 NOTE — Telephone Encounter (Signed)
Patient aware that medication has been sent to the pharmacy 

## 2016-02-14 ENCOUNTER — Other Ambulatory Visit: Payer: Self-pay | Admitting: Nurse Practitioner

## 2016-04-11 ENCOUNTER — Other Ambulatory Visit: Payer: Self-pay

## 2016-04-11 ENCOUNTER — Other Ambulatory Visit: Payer: Self-pay | Admitting: Pediatrics

## 2016-04-11 ENCOUNTER — Ambulatory Visit (INDEPENDENT_AMBULATORY_CARE_PROVIDER_SITE_OTHER): Payer: BLUE CROSS/BLUE SHIELD | Admitting: Pediatrics

## 2016-04-11 ENCOUNTER — Encounter: Payer: Self-pay | Admitting: Pediatrics

## 2016-04-11 ENCOUNTER — Ambulatory Visit (INDEPENDENT_AMBULATORY_CARE_PROVIDER_SITE_OTHER): Payer: BLUE CROSS/BLUE SHIELD

## 2016-04-11 VITALS — BP 130/86 | HR 78 | Temp 98.1°F | Ht 69.0 in | Wt 198.8 lb

## 2016-04-11 DIAGNOSIS — M25562 Pain in left knee: Secondary | ICD-10-CM

## 2016-04-11 MED ORDER — IBUPROFEN 800 MG PO TABS: 800.0000 mg | ORAL_TABLET | Freq: Three times a day (TID) | ORAL | 0 refills | Status: DC | PRN

## 2016-04-11 NOTE — Progress Notes (Signed)
  Subjective:   Patient ID: Gregg Morgan, male    DOB: 04-29-1960, 55 y.o.   MRN: [id number] CC: Knee Pain (Left x 1 mth, worsening, NKI)  HPI: Gregg Morgan is a 55 y.o. male presenting for Knee Pain (Left x 1 mth, worsening, NKI)  Limp started about a month ago Has taken ibuprofen about 3 times, doesn't think it helped Tylenol off and on Swelling the more he stays on it Hurts on medial side of knee Minimal pain as long as not weight bearing  Relevant past medical, surgical, family and social history reviewed. Allergies and medications reviewed and updated. History  Smoking Status  . Former Smoker  . Packs/day: 0.50  . Years: 20.00  . Quit date: 03/12/2016  Smokeless Tobacco  . Never Used   ROS: Per HPI   Objective:    BP 130/86 (BP Location: Left Arm, Patient Position: Sitting, Cuff Size: Normal)   Pulse 78   Temp 98.1 F (36.7 C) (Oral)   Ht 5\' 9"  (1.753 m)   Wt 198 lb 12.8 oz (90.2 kg)   BMI 29.36 kg/m   Wt Readings from Last 3 Encounters:  04/11/16 198 lb 12.8 oz (90.2 kg)  01/15/16 191 lb (86.6 kg)  01/10/16 188 lb 4 oz (85.4 kg)    Gen: NAD, alert, cooperative with exam, NCAT EYES: EOMI, no conjunctival injection, or no icterus CV: WWP, distal pulses 2+ b/l Resp: normal WOB Ext: No edema, warm Neuro: Alert and oriented, strength equal b/l UE and LE, coordination grossly normal MSK: minimal effusion present L knee compared with R No redness b/l knees No TTP along joint line No ligament laxity Mild pain with Mcmurray, no popping  Assessment & Plan:  Gregg Morgan was seen today for knee pain.  Diagnoses and all orders for this visit:  Acute pain of left knee Ongoing last couple of months, getting worse Pain medial joint line Discussed NSAIDs, rest -     DG Knee 1-2 Views Left -     ibuprofen (ADVIL,MOTRIN) 800 MG tablet; Take 1 tablet (800 mg total) by mouth every 8 (eight) hours as needed. -     Ambulatory referral to Orthopedic Surgery   Follow up  plan: prn Rex Krasarol Janyce Ellinger, MD [facility]

## 2016-04-18 ENCOUNTER — Encounter: Payer: Self-pay | Admitting: Family Medicine

## 2016-04-18 ENCOUNTER — Ambulatory Visit (INDEPENDENT_AMBULATORY_CARE_PROVIDER_SITE_OTHER): Payer: BLUE CROSS/BLUE SHIELD | Admitting: Family Medicine

## 2016-04-18 VITALS — BP 135/87 | HR 73 | Temp 97.4°F | Ht 69.0 in | Wt 198.8 lb

## 2016-04-18 DIAGNOSIS — J209 Acute bronchitis, unspecified: Secondary | ICD-10-CM

## 2016-04-18 MED ORDER — AZITHROMYCIN 250 MG PO TABS: ORAL_TABLET | ORAL | 0 refills | Status: DC

## 2016-04-18 MED ORDER — PREDNISONE 20 MG PO TABS: 40.0000 mg | ORAL_TABLET | Freq: Every day | ORAL | 0 refills | Status: DC

## 2016-04-18 NOTE — Progress Notes (Signed)
   HPI  Patient presents today here with acute illness.  Patient explains over the last 7 days he's had a stable and worsening course of illness. He began with cough and nasal congestion and has continued to worsen. He states the cough is worse over the last 24-36 hours is now productive of brown and yellow sputum, he's having chills and sweats. He denies any objectively measured fevers.  He's tolerating food and fluids normally. He denies any chest pain or dyspnea.  He also denies any facial pain or ear pain. He has had body aches, however they've developed in the last 24 hours they have not been the entire course.  PMH: Smoking status noted ROS: Per HPI  Objective: BP 135/87   Pulse 73   Temp 97.4 F (36.3 C) (Oral)   Ht 5\' 9"  (1.753 m)   Wt 198 lb 12.8 oz (90.2 kg)   BMI 29.36 kg/m  Gen: NAD, alert, cooperative with exam HEENT: NCAT, swollen turbinates bilaterally, TMs normal bilaterally, no tenderness to palpation of the maxillary sinuses CV: RRR, good S1/S2, no murmur Resp: CTABL, no wheezes, non-labored Ext: No edema, warm Neuro: Alert and oriented, No gross deficits  Assessment and plan:  # Acute bronchitis Patient with worsening illness at 7 days with known CAD, hypertension, OSA. Given worsening illness and now subjective fevers I have decided to treat him for possibly developing pneumonia. Azithromycin Short course of prednisone helped with cough and bronchitic syndrome    Meds ordered this encounter  Medications  . azithromycin (ZITHROMAX) 250 MG tablet    Sig: Take 2 tablets on day 1 and 1 tablet daily after that    Dispense:  6 tablet    Refill:  0  . predniSONE (DELTASONE) 20 MG tablet    Sig: Take 2 tablets (40 mg total) by mouth daily with breakfast.    Dispense:  10 tablet    Refill:  0    Murtis SinkSam Claribel Sachs, MD [facility] 04/18/2016, 2:28 PM

## 2016-04-18 NOTE — Patient Instructions (Signed)
Great to see you!   Acute Bronchitis, Adult Acute bronchitis is when air tubes (bronchi) in the lungs suddenly get swollen. The condition can make it hard to breathe. It can also cause these symptoms:  A cough.  Coughing up clear, yellow, or green mucus.  Wheezing.  Chest congestion.  Shortness of breath.  A fever.  Body aches.  Chills.  A sore throat.  Follow these instructions at home: Medicines  Take over-the-counter and prescription medicines only as told by your doctor.  If you were prescribed an antibiotic medicine, take it as told by your doctor. Do not stop taking the antibiotic even if you start to feel better. General instructions  Rest.  Drink enough fluids to keep your pee (urine) clear or pale yellow.  Avoid smoking and secondhand smoke. If you smoke and you need help quitting, ask your doctor. Quitting will help your lungs heal faster.  Use an inhaler, cool mist vaporizer, or humidifier as told by your doctor.  Keep all follow-up visits as told by your doctor. This is important. How is this prevented? To lower your risk of getting this condition again:  Wash your hands often with soap and water. If you cannot use soap and water, use hand sanitizer.  Avoid contact with people who have cold symptoms.  Try not to touch your hands to your mouth, nose, or eyes.  Make sure to get the flu shot every year.  Contact a doctor if:  Your symptoms do not get better in 2 weeks. Get help right away if:  You cough up blood.  You have chest pain.  You have very bad shortness of breath.  You become dehydrated.  You faint (pass out) or keep feeling like you are going to pass out.  You keep throwing up (vomiting).  You have a very bad headache.  Your fever or chills gets worse. This information is not intended to replace advice given to you by your health care provider. Make sure you discuss any questions you have with your health care  provider. Document Released: 09/18/2007 Document Revised: 11/08/2015 Document Reviewed: 09/20/2015 Elsevier Interactive Patient Education  2017 [employer]  

## 2016-04-24 ENCOUNTER — Encounter: Payer: Self-pay | Admitting: Family Medicine

## 2016-04-24 ENCOUNTER — Telehealth: Payer: Self-pay | Admitting: Family Medicine

## 2016-04-24 ENCOUNTER — Ambulatory Visit (INDEPENDENT_AMBULATORY_CARE_PROVIDER_SITE_OTHER): Payer: BLUE CROSS/BLUE SHIELD | Admitting: Family Medicine

## 2016-04-24 ENCOUNTER — Ambulatory Visit (INDEPENDENT_AMBULATORY_CARE_PROVIDER_SITE_OTHER): Payer: BLUE CROSS/BLUE SHIELD

## 2016-04-24 ENCOUNTER — Other Ambulatory Visit: Payer: Self-pay | Admitting: Family Medicine

## 2016-04-24 VITALS — BP 126/80 | HR 95 | Temp 98.1°F | Ht 69.0 in | Wt 202.0 lb

## 2016-04-24 DIAGNOSIS — R05 Cough: Secondary | ICD-10-CM | POA: Diagnosis not present

## 2016-04-24 DIAGNOSIS — J329 Chronic sinusitis, unspecified: Secondary | ICD-10-CM

## 2016-04-24 DIAGNOSIS — R059 Cough, unspecified: Secondary | ICD-10-CM

## 2016-04-24 DIAGNOSIS — J40 Bronchitis, not specified as acute or chronic: Secondary | ICD-10-CM | POA: Diagnosis not present

## 2016-04-24 DIAGNOSIS — R509 Fever, unspecified: Secondary | ICD-10-CM

## 2016-04-24 DIAGNOSIS — J181 Lobar pneumonia, unspecified organism: Principal | ICD-10-CM

## 2016-04-24 DIAGNOSIS — J189 Pneumonia, unspecified organism: Secondary | ICD-10-CM

## 2016-04-24 LAB — VERITOR FLU A/B WAIVED
INFLUENZA B: NEGATIVE
Influenza A: NEGATIVE

## 2016-04-24 MED ORDER — HYDROCODONE-HOMATROPINE 5-1.5 MG/5ML PO SYRP: 5.0000 mL | ORAL_SOLUTION | Freq: Four times a day (QID) | ORAL | 0 refills | Status: DC | PRN

## 2016-04-24 MED ORDER — BETAMETHASONE SOD PHOS & ACET 6 (3-3) MG/ML IJ SUSP
6.0000 mg | Freq: Once | INTRAMUSCULAR | Status: AC
  Administered 2016-04-24: 6 mg via INTRAMUSCULAR

## 2016-04-24 MED ORDER — LEVOFLOXACIN 500 MG PO TABS: 500.0000 mg | ORAL_TABLET | Freq: Every day | ORAL | 0 refills | Status: DC

## 2016-04-24 NOTE — Telephone Encounter (Signed)
Pt's wife calling for results of CXR Please advise

## 2016-04-24 NOTE — Telephone Encounter (Signed)
Gave wife results of CXR which is compatible with pneumonia. Instructed her that pt to return to clinic in 3-4 weeks for repeat CXR to check for resolution. Wife verbalized understanding and orders placed for CXR

## 2016-04-24 NOTE — Progress Notes (Signed)
Subjective:  Patient ID: Gregg Morgan, male    DOB: 09-25-1960  Age: 56 y.o. MRN: [id number]  CC: Fever (pt here today c/o fever last night and cough, just finished taking antibiotic 2 days ago)   HPI Gregg Morgan presents  with upper respiratory congestion, productive cough, runny stuffy nose. Diffuse headache of moderate intensity. Patient also has chills and 102 degree fever last night. Body aches all over. Has sapped the energy. In last week and given antibiotic that didn't help. History Gregg Morgan has a past medical history of Coronary artery disease; GERD (gastroesophageal reflux disease); Hyperlipidemia; Hypertension; and Obstructive sleep apnea.   He has a past surgical history that includes none; Finger surgery; Cardiac catheterization; and ORIF finger fracture (08/03/2011).   His family history includes Colon cancer in his father.He reports that he quit smoking about 6 weeks ago. He has a 10.00 pack-year smoking history. He has never used smokeless tobacco. He reports that he drinks about 1.2 oz of alcohol per week . He reports that he does not use drugs.  Current Outpatient Prescriptions on File Prior to Visit  Medication Sig Dispense Refill  . aspirin 81 MG tablet Take 81 mg by mouth at bedtime.     . Cholecalciferol (VITAMIN D) 2000 UNITS CAPS Take 1 capsule by mouth at bedtime.     . fish oil-omega-3 fatty acids 1000 MG capsule Take 1,000 mg by mouth at bedtime.     Marland Kitchen. ibuprofen (ADVIL,MOTRIN) 800 MG tablet Take 1 tablet (800 mg total) by mouth every 8 (eight) hours as needed. 30 tablet 0  . lisinopril (PRINIVIL,ZESTRIL) 20 MG tablet Take 1 tablet (20 mg total) by mouth daily. 90 tablet 2  . pantoprazole (PROTONIX) 40 MG tablet Take 1 tablet (40 mg total) by mouth daily. 30 tablet 3  . rosuvastatin (CRESTOR) 5 MG tablet Take 1 tablet (5 mg total) by mouth daily. 30 tablet 5   No current facility-administered medications on file prior to visit.     ROS Review of Systems    Constitutional: Negative for activity change, appetite change, chills and fever.  HENT: Positive for congestion, postnasal drip, rhinorrhea and sinus pressure. Negative for ear discharge, ear pain, hearing loss, nosebleeds, sneezing and trouble swallowing.   Respiratory: Positive for cough and chest tightness. Negative for shortness of breath.   Cardiovascular: Negative for chest pain and palpitations.  Skin: Negative for rash.    Objective:  BP 126/80   Pulse 95   Temp 98.1 F (36.7 C) (Oral)   Ht 5\' 9"  (1.753 m)   Wt 202 lb (91.6 kg)   BMI 29.83 kg/m   Physical Exam  Constitutional: He appears well-developed and well-nourished.  HENT:  Head: Normocephalic and atraumatic.  Right Ear: Tympanic membrane and external ear normal. No decreased hearing is noted.  Left Ear: Tympanic membrane and external ear normal. No decreased hearing is noted.  Nose: Mucosal edema present. Right sinus exhibits no frontal sinus tenderness. Left sinus exhibits no frontal sinus tenderness.  Mouth/Throat: No oropharyngeal exudate or posterior oropharyngeal erythema.  Neck: No Brudzinski's sign noted.  Pulmonary/Chest: No respiratory distress. He has wheezes (bases). He has rales (at bases).  Lymphadenopathy:    He has no cervical adenopathy.    Assessment & Plan:   Gregg Morgan was seen today for fever.  Diagnoses and all orders for this visit:  Cough with fever -     DG Chest 2 View; Future -     Veritor Flu  A/B Waived   I have discontinued Mr. Mandigo's triamcinolone cream, azithromycin, and predniSONE. I am also having him maintain his aspirin, fish oil-omega-3 fatty acids, Vitamin D, pantoprazole, lisinopril, rosuvastatin, and ibuprofen.  No orders of the defined types were placed in this encounter.    Follow-up: No Follow-up on file.  Mechele Claude, M.D.

## 2016-04-26 ENCOUNTER — Telehealth: Payer: Self-pay | Admitting: Family Medicine

## 2016-04-26 NOTE — Telephone Encounter (Signed)
Spoke with pt's wife regarding Pt has some blood tinged sputum Pt is concerned Per Dr Darlyn ReadStacks, probably due to irritation if worsens or persists please RTC

## 2016-05-03 ENCOUNTER — Telehealth: Payer: Self-pay | Admitting: Family Medicine

## 2016-05-03 ENCOUNTER — Other Ambulatory Visit: Payer: Self-pay | Admitting: Family Medicine

## 2016-05-03 MED ORDER — OSELTAMIVIR PHOSPHATE 75 MG PO CAPS: 75.0000 mg | ORAL_CAPSULE | Freq: Every day | ORAL | 0 refills | Status: DC

## 2016-05-03 NOTE — Telephone Encounter (Signed)
Pt's wife notified of RX 

## 2016-05-03 NOTE — Telephone Encounter (Signed)
I sent in the requested prescription 

## 2016-05-03 NOTE — Telephone Encounter (Signed)
Please review and advise.

## 2016-05-05 ENCOUNTER — Other Ambulatory Visit: Payer: Self-pay | Admitting: Family Medicine

## 2016-05-22 ENCOUNTER — Other Ambulatory Visit: Payer: Self-pay | Admitting: *Deleted

## 2016-05-22 DIAGNOSIS — K219 Gastro-esophageal reflux disease without esophagitis: Secondary | ICD-10-CM

## 2016-05-22 MED ORDER — PANTOPRAZOLE SODIUM 40 MG PO TBEC: 40.0000 mg | DELAYED_RELEASE_TABLET | Freq: Every day | ORAL | 0 refills | Status: DC

## 2016-06-04 ENCOUNTER — Ambulatory Visit (INDEPENDENT_AMBULATORY_CARE_PROVIDER_SITE_OTHER): Payer: BLUE CROSS/BLUE SHIELD | Admitting: Family Medicine

## 2016-06-04 ENCOUNTER — Encounter: Payer: Self-pay | Admitting: Family Medicine

## 2016-06-04 VITALS — BP 125/85 | HR 78 | Temp 98.1°F | Ht 69.0 in | Wt 200.0 lb

## 2016-06-04 DIAGNOSIS — I10 Essential (primary) hypertension: Secondary | ICD-10-CM | POA: Diagnosis not present

## 2016-06-04 DIAGNOSIS — G4733 Obstructive sleep apnea (adult) (pediatric): Secondary | ICD-10-CM | POA: Diagnosis not present

## 2016-06-04 DIAGNOSIS — Z9989 Dependence on other enabling machines and devices: Secondary | ICD-10-CM

## 2016-06-04 NOTE — Progress Notes (Signed)
Subjective:  Patient ID: Gregg Morgan, male    DOB: 07/21/1960  Age: 56 y.o. MRN: [id number]  CC: Sleep Apnea (needs updated, needs supplies)   HPI Gregg Morgan presents for Follow-up of sleep apnea. Patient has not had any contact with his sleep doctor in 8 or 9 years. He doesn't even know where the doctor is anymore. Doesn't know the name. All he knows is that he can no longer get supplies without a new prescription. Unfortunately his mask got chewed up. This by a family pet. Additionally his humidifier doesn't work anymore. His wife said he is snoring loudly. She says that when he was diagnosed he was told it was one of the worst cases stayed seen area she cannot stay in the same room with him because of his snoring. He apparently uses a large amount of CPAP because when she puts it on her face that his settings it practically blows the mask off her face. That is true in spite of the fact that she is also a CPAP patient.   History Gregg Morgan has a past medical history of Coronary artery disease; GERD (gastroesophageal reflux disease); Hyperlipidemia; Hypertension; and Obstructive sleep apnea.   He has a past surgical history that includes none; Finger surgery; Cardiac catheterization; and ORIF finger fracture (08/03/2011).   His family history includes Colon cancer in his father.He reports that he quit smoking about 2 months ago. He has a 10.00 pack-year smoking history. He has never used smokeless tobacco. He reports that he drinks about 1.2 oz of alcohol per week . He reports that he does not use drugs.    ROS Review of Systems  Constitutional: Negative for chills, diaphoresis, fever and unexpected weight change.  HENT: Negative for congestion, hearing loss, rhinorrhea and sore throat.   Eyes: Negative for visual disturbance.  Respiratory: Negative for cough and shortness of breath.   Cardiovascular: Negative for chest pain.  Gastrointestinal: Negative for abdominal pain,  constipation and diarrhea.  Genitourinary: Negative for dysuria and flank pain.  Musculoskeletal: Negative for arthralgias and joint swelling.  Skin: Negative for rash.  Neurological: Negative for dizziness and headaches.  Psychiatric/Behavioral: Negative for dysphoric mood and sleep disturbance.    Objective:  BP 125/85   Pulse 78   Temp 98.1 F (36.7 C) (Oral)   Ht 5\' 9"  (1.753 m)   Wt 200 lb (90.7 kg)   BMI 29.53 kg/m   BP Readings from Last 3 Encounters:  06/04/16 125/85  04/24/16 126/80  04/18/16 135/87    Wt Readings from Last 3 Encounters:  06/04/16 200 lb (90.7 kg)  04/24/16 202 lb (91.6 kg)  04/18/16 198 lb 12.8 oz (90.2 kg)     Physical Exam  Constitutional: He is oriented to person, place, and time. He appears well-developed and well-nourished. No distress.  HENT:  Head: Normocephalic and atraumatic.  Right Ear: External ear normal.  Left Ear: External ear normal.  Nose: Nose normal.  Mouth/Throat: Oropharynx is clear and moist.  Eyes: Conjunctivae and EOM are normal. Pupils are equal, round, and reactive to light.  Neck: Normal range of motion. Neck supple. No thyromegaly present.  Cardiovascular: Normal rate, regular rhythm and normal heart sounds.   No murmur heard. Pulmonary/Chest: Effort normal and breath sounds normal. No respiratory distress. He has no wheezes. He has no rales.  Abdominal: Soft. Bowel sounds are normal. He exhibits no distension. There is no tenderness.  Lymphadenopathy:    He has no cervical adenopathy.  Neurological: He is alert and oriented to person, place, and time. He has normal reflexes.  Skin: Skin is warm and dry.  Psychiatric: He has a normal mood and affect. His behavior is normal. Judgment and thought content normal.    Dg Shoulder Left  Result Date: 04/15/2014 CLINICAL DATA:  Larey Seat yesterday, tripping while walking. Fall onto left shoulder with subsequent pain. EXAM: LEFT SHOULDER - 2+ VIEW COMPARISON:  Chest  radiography 08/01/2011 FINDINGS: There is AC joint separation with widening of the [facility] joint but no elevation of the distal clavicle. No fracture. Glenohumeral joint is normal. IMPRESSION: AC separation with widening of the joint space but no elevation of the clavicle or fracture. Electronically Signed   By: Gregg Morgan M.D.   On: 04/15/2014 16:32    Assessment & Plan:   Gregg Morgan was seen today for sleep apnea.  Diagnoses and all orders for this visit:  Obstructive sleep apnea on CPAP -     Ambulatory referral to Sleep Studies  Essential hypertension      I have discontinued [name]'s levofloxacin, HYDROcodone-homatropine, and oseltamivir. I am also having him maintain his aspirin, fish oil-omega-3 fatty acids, Vitamin D, lisinopril, rosuvastatin, ibuprofen, and pantoprazole.  Allergies as of 06/04/2016   No Known Allergies     Medication List       Accurate as of 06/04/16  5:42 PM. Always use your most recent med list.          aspirin 81 MG tablet Take 81 mg by mouth at bedtime.   fish oil-omega-3 fatty acids 1000 MG capsule Take 1,000 mg by mouth at bedtime.   ibuprofen 800 MG tablet Commonly known as:  ADVIL,MOTRIN Take 1 tablet (800 mg total) by mouth every 8 (eight) hours as needed.   lisinopril 20 MG tablet Commonly known as:  PRINIVIL,ZESTRIL Take 1 tablet (20 mg total) by mouth daily.   pantoprazole 40 MG tablet Commonly known as:  PROTONIX Take 1 tablet (40 mg total) by mouth daily.   rosuvastatin 5 MG tablet Commonly known as:  CRESTOR Take 1 tablet (5 mg total) by mouth daily.   Vitamin D 2000 units Caps Take 1 capsule by mouth at bedtime.        Follow-up: Return if symptoms worsen or fail to improve.  Gregg Morgan, M.D.

## 2016-06-06 ENCOUNTER — Telehealth: Payer: Self-pay | Admitting: Family Medicine

## 2016-06-06 ENCOUNTER — Other Ambulatory Visit: Payer: Self-pay | Admitting: Family Medicine

## 2016-06-06 MED ORDER — OSELTAMIVIR PHOSPHATE 75 MG PO CAPS: 75.0000 mg | ORAL_CAPSULE | Freq: Every day | ORAL | 0 refills | Status: DC

## 2016-06-06 NOTE — Telephone Encounter (Signed)
Patient aware.

## 2016-06-06 NOTE — Telephone Encounter (Signed)
I sent in the requested prescription 

## 2016-06-26 ENCOUNTER — Institutional Professional Consult (permissible substitution): Payer: BLUE CROSS/BLUE SHIELD | Admitting: Neurology

## 2016-06-26 ENCOUNTER — Ambulatory Visit (INDEPENDENT_AMBULATORY_CARE_PROVIDER_SITE_OTHER): Payer: BLUE CROSS/BLUE SHIELD | Admitting: Neurology

## 2016-06-26 ENCOUNTER — Encounter: Payer: Self-pay | Admitting: Neurology

## 2016-06-26 VITALS — BP 128/72 | HR 78 | Resp 16 | Ht 69.0 in | Wt 200.0 lb

## 2016-06-26 DIAGNOSIS — G4733 Obstructive sleep apnea (adult) (pediatric): Secondary | ICD-10-CM | POA: Diagnosis not present

## 2016-06-26 DIAGNOSIS — R0683 Snoring: Secondary | ICD-10-CM | POA: Diagnosis not present

## 2016-06-26 DIAGNOSIS — Z820 Family history of epilepsy and other diseases of the nervous system: Secondary | ICD-10-CM

## 2016-06-26 DIAGNOSIS — R351 Nocturia: Secondary | ICD-10-CM

## 2016-06-26 DIAGNOSIS — R519 Headache, unspecified: Secondary | ICD-10-CM

## 2016-06-26 DIAGNOSIS — R400 Somnolence: Secondary | ICD-10-CM

## 2016-06-26 DIAGNOSIS — R51 Headache: Secondary | ICD-10-CM

## 2016-06-26 NOTE — Patient Instructions (Signed)

## 2016-06-26 NOTE — Progress Notes (Signed)
Subjective:    Patient ID: Gregg Morgan is a 56 y.o. male.  HPI    Huston Foley, MD, PhD [facility] [address], [address] [address] [place], [place] [address]  Dear Dr. Darlyn Read,   I saw your patient, Gregg Morgan, upon your kind request in my neurologic clinic today for initial consultation of his sleep disorder, in particular, reevaluation of his prior diagnosis of OSA. The patient is accompanied by his wife today. As you know, [name] is a 56 year old right-handed gentleman with an underlying medical history of reflux disease, hypertension, hyperlipidemia, coronary artery disease, recent smoking cessation in 1/18, and overweight state, who was previously diagnosed with obstructive sleep apnea and placed on CPAP therapy. I reviewed your office note from 06/04/2016. He has been on CPAP but needs new supplies and his machine has a defective humidifier. His mask has not been changed and while, he has not had a follow-up with his sleep specialist for over 8 years. His Epworth sleepiness score is 12 out of 24, fatigue score is 25 out of 63. Last sleep study was in 2011, sleep study results are not available for my review today but the patient's wife has records handwritten and paperwork from the DME company which indicated an AHI of 48 per hour, REM AHI of 70 per hour, O2 nadir of 72%. This indicates severe sleep apnea. I reviewed his CPAP compliance data from 05/05/2016 through 06/03/2016, which is a total of 30 days, during which time he used his device only 12 days with percent used days greater than 4 hours at 36.7%, indicating suboptimal compliance, average AHI of 4.5 per hour, CPAP pressure at 11 cm, leak low. He has not been able to use his CPAP for the past few weeks as he needs new supplies. He takes no narcotic pain medication and has been taking ibuprofen as needed for knee pain. His knee pain improved. His wife reports that he snores loudly and has pauses in his  breathing. He denies any restless legs type symptoms. His sister has sleep apnea and uses a CPAP machine. He endorses nocturia about once per average night, morning headaches occasionally. Bedtime is between 9 and 9:30 PM, he falls asleep quickly. TV is on in the beginning but wife turns it off, he does not care one way or another for TV at night. His wake up time is early at 3:30 AM. He has to be at work at 5 AM. He works at a [employer] as a Merchandiser, retail. He works from 5 AM to 3 PM, sometimes 6 days a week. He lives at home with his wife in 59 year old daughter. He drinks alcohol occasionally and caffeine in the form of diet Dr. Reino Kent, 2 cans per day on average. He denies any parasomnias or leg twitching at night. They have a small dog in the bedroom and typically on the bed at night.  His Past Medical History Is Significant For: Past Medical History:  Diagnosis Date  . Coronary artery disease   . GERD (gastroesophageal reflux disease)   . Hyperlipidemia   . Hypertension   . Obstructive sleep apnea     His Past Surgical History Is Significant For: Past Surgical History:  Procedure Laterality Date  . CARDIAC CATHETERIZATION     09/2009--DR  BERRY  . FINGER SURGERY    . none    . ORIF FINGER FRACTURE  08/03/2011   Procedure: OPEN REDUCTION INTERNAL FIXATION (ORIF) METACARPAL (FINGER) FRACTURE;  Surgeon: Sharma Covert, MD;  Location: [facility] OR;  Service: Orthopedics;  Laterality: Right;  RIGHT THUMB ORIF    His Family History Is Significant For: Family History  Problem Relation Age of Onset  . Colon cancer Father   . Thyroid disease Mother   . Hypertension Mother     His Social History Is Significant For: Social History   Social History  . Marital status: Married    Spouse name: N/A  . Number of children: 1  . Years of education: 34   Occupational History  . machine operator [employer]   Social History Main Topics  . Smoking status: Former Smoker    Packs/day: 0.50     Years: 20.00    Quit date: 03/12/2016  . Smokeless tobacco: Never Used  . Alcohol use 1.2 oz/week    2 Cans of beer per week     Comment: Occasion on weekends  . Drug use: No  . Sexual activity: Not Asked   Other Topics Concern  . None   Social History Narrative  . None    His Allergies Are:  No Known Allergies:   His Current Medications Are:  Outpatient Encounter Prescriptions as of 06/26/2016  Medication Sig  . aspirin 81 MG tablet Take 81 mg by mouth at bedtime.   . Cholecalciferol (VITAMIN D) 2000 UNITS CAPS Take 1 capsule by mouth at bedtime.   . fish oil-omega-3 fatty acids 1000 MG capsule Take 1,000 mg by mouth at bedtime.   Marland Kitchen ibuprofen (ADVIL,MOTRIN) 800 MG tablet Take 1 tablet (800 mg total) by mouth every 8 (eight) hours as needed.  Marland Kitchen lisinopril (PRINIVIL,ZESTRIL) 20 MG tablet Take 1 tablet (20 mg total) by mouth daily.  . pantoprazole (PROTONIX) 40 MG tablet Take 1 tablet (40 mg total) by mouth daily.  . rosuvastatin (CRESTOR) 5 MG tablet Take 1 tablet (5 mg total) by mouth daily.  . [DISCONTINUED] oseltamivir (TAMIFLU) 75 MG capsule Take 1 capsule (75 mg total) by mouth daily. For two weeks to prevent flu   No facility-administered encounter medications on file as of 06/26/2016.   :  Review of Systems:  Out of a complete 14 point review of systems, all are reviewed and negative with the exception of these symptoms as listed below: Review of Systems  Neurological:       Patient had a sleep study in 2011. He was placed on CPAP. Has not used in last 3-4 weeks due to dog chewing up some of the parts.   Snores, witnessed apnea, falls asleep when sitting still.    Epworth Sleepiness Scale 0= would never doze 1= slight chance of dozing 2= moderate chance of dozing 3= high chance of dozing  Sitting and reading:3 Watching TV:3 Sitting inactive in a public place (ex. Theater or meeting):1 As a passenger in a car for an hour without a break:2 Lying down to rest in  the afternoon:3 Sitting and talking to someone:0 Sitting quietly after lunch (no alcohol):0 In a car, while stopped in traffic:0 Total:12   Objective:  Neurologic Exam  Physical Exam Physical Examination:   Vitals:   06/26/16 1601  BP: 128/72  Pulse: 78  Resp: 16    General Examination: The patient is a very pleasant 56 y.o. male in no acute distress. He appears well-developed and well-nourished and adequately groomed.   HEENT: Normocephalic, atraumatic, pupils are equal, round and reactive to light and accommodation. Funduscopic exam is normal with sharp disc margins noted. Extraocular tracking  is good without limitation to gaze excursion or nystagmus noted. Normal smooth pursuit is noted. Hearing is grossly intact. Tympanic membranes are clear bilaterally. Face is symmetric with normal facial animation and normal facial sensation. Speech is clear with no dysarthria noted. There is no hypophonia. There is no lip, neck/head, jaw or voice tremor. Neck is supple with full range of passive and active motion. There are no carotid bruits on auscultation. Oropharynx exam reveals: mild mouth dryness, adequate to marginal dental hygiene and marked airway crowding, due to larger uvula and tonsils of about 3+. Mallampati is class II. Tongue protrudes centrally and palate elevates symmetrically. Neck size is 16 7/8 inches. He has a Mild overbite.    Chest: Clear to auscultation without wheezing, rhonchi or crackles noted.  Heart: S1+S2+0, regular and normal without murmurs, rubs or gallops noted.   Abdomen: Soft, non-tender and non-distended with normal bowel sounds appreciated on auscultation.  Extremities: There is no pitting edema in the distal lower extremities bilaterally. Pedal pulses are intact.  Skin: Warm and dry without trophic changes noted.  Musculoskeletal: exam reveals no obvious joint deformities, tenderness or joint swelling or erythema.   Neurologically:  Mental status: The  patient is awake, alert and oriented in all 4 spheres. His immediate and remote memory, attention, language skills and fund of knowledge are appropriate. There is no evidence of aphasia, agnosia, apraxia or anomia. Speech is clear with normal prosody and enunciation. Thought process is linear. Mood is normal and affect is normal.  Cranial nerves II - XII are as described above under HEENT exam. In addition: shoulder shrug is normal with equal shoulder height noted. Motor exam: Normal bulk, strength and tone is noted. There is no drift, tremor or rebound. Romberg is negative. Reflexes are 1-2+ throughout. Fine motor skills and coordination: intact with normal finger taps, normal hand movements, normal rapid alternating patting, normal foot taps and normal foot agility.  Cerebellar testing: No dysmetria or intention tremor on finger to nose testing. Heel to shin is unremarkable bilaterally. There is no truncal or gait ataxia.  Sensory exam: intact to light touch in the upper and lower extremities.  Gait, station and balance: He stands easily. No veering to one side is noted. No leaning to one side is noted. Posture is age-appropriate and stance is narrow based. Gait shows normal stride length and normal pace. No problems turning are noted. Tandem walk is unremarkable.   Assessment and Plan:   In summary, Zannie CoveKeith L Sear is a very pleasant 56 y.o.-year old male with an underlying medical history of reflux disease, hypertension, hyperlipidemia, coronary artery disease, recent smoking cessation in 1/18, and overweight state, whose history and physical exam are in keeping with obstructive sleep apnea (OSA). He was Previously diagnosed with severe obstructive sleep apnea and needs reevaluation and new equipment. I had a long chat with the patient and his wife about my findings and the diagnosis of OSA, its prognosis and treatment options. We talked about medical treatments, surgical interventions and  non-pharmacological approaches. I explained in particular the risks and ramifications of untreated moderate to severe OSA, especially with respect to developing cardiovascular disease down the Road, including congestive heart failure, difficult to treat hypertension, cardiac arrhythmias, or stroke. Even type 2 diabetes has, in part, been linked to untreated OSA. Symptoms of untreated OSA include daytime sleepiness, memory problems, mood irritability and mood disorder such as depression and anxiety, lack of energy, as well as recurrent headaches, especially morning headaches. We talked  about ongoing smoking cessation (he uses the vapor cigs now) and trying to maintain a healthy lifestyle in general, as well as the importance of weight control. I encouraged the patient to eat healthy, exercise daily and keep well hydrated, to keep a scheduled bedtime and wake time routine, to not skip any meals and eat healthy snacks in between meals. I advised the patient not to drive when feeling sleepy. I recommended the following at this time: sleep study with potential positive airway pressure titration. (We will score hypopneas at 3%).   I explained the sleep test procedure to the patient and also outlined possible surgical and non-surgical treatment options of OSA, including the use of a custom-made dental device (which would require a referral to a specialist dentist or oral surgeon), upper airway surgical options, such as pillar implants, radiofrequency surgery, tongue base surgery, and UPPP (which would involve a referral to an ENT surgeon). Rarely, jaw surgery such as mandibular advancement may be considered.  I also explained the CPAP treatment option to the patient, who indicated that he would be willing to use CPAP. I explained the importance of being compliant with PAP treatment, not only for insurance purposes but primarily to improve His symptoms, and for the patient's long term health benefit, including to reduce  His cardiovascular risks. I answered all their questions today and the patient and his wife were in agreement. I would like to see him back after the sleep study is completed and encouraged him to call with any interim questions, concerns, problems or updates.   Thank you very much for allowing me to participate in the care of this nice patient. If I can be of any further assistance to you please do not hesitate to call me at [phone].  Sincerely,   Huston Foley, MD, PhD

## 2016-07-15 ENCOUNTER — Ambulatory Visit (INDEPENDENT_AMBULATORY_CARE_PROVIDER_SITE_OTHER): Payer: BLUE CROSS/BLUE SHIELD | Admitting: Family Medicine

## 2016-07-15 ENCOUNTER — Encounter: Payer: Self-pay | Admitting: Family Medicine

## 2016-07-15 VITALS — BP 135/88 | HR 70 | Temp 96.8°F | Ht 69.0 in | Wt 203.0 lb

## 2016-07-15 DIAGNOSIS — Z0000 Encounter for general adult medical examination without abnormal findings: Secondary | ICD-10-CM | POA: Diagnosis not present

## 2016-07-15 DIAGNOSIS — F17200 Nicotine dependence, unspecified, uncomplicated: Secondary | ICD-10-CM

## 2016-07-15 DIAGNOSIS — E785 Hyperlipidemia, unspecified: Secondary | ICD-10-CM

## 2016-07-15 DIAGNOSIS — G4733 Obstructive sleep apnea (adult) (pediatric): Secondary | ICD-10-CM

## 2016-07-15 DIAGNOSIS — K219 Gastro-esophageal reflux disease without esophagitis: Secondary | ICD-10-CM | POA: Diagnosis not present

## 2016-07-15 DIAGNOSIS — I10 Essential (primary) hypertension: Secondary | ICD-10-CM

## 2016-07-15 DIAGNOSIS — Z9989 Dependence on other enabling machines and devices: Secondary | ICD-10-CM

## 2016-07-15 DIAGNOSIS — I25119 Atherosclerotic heart disease of native coronary artery with unspecified angina pectoris: Secondary | ICD-10-CM | POA: Diagnosis not present

## 2016-07-15 MED ORDER — LISINOPRIL 20 MG PO TABS: 20.0000 mg | ORAL_TABLET | Freq: Every day | ORAL | 2 refills | Status: DC

## 2016-07-15 MED ORDER — ROSUVASTATIN CALCIUM 5 MG PO TABS: 5.0000 mg | ORAL_TABLET | Freq: Every day | ORAL | 5 refills | Status: DC

## 2016-07-15 MED ORDER — FEXOFENADINE HCL 180 MG PO TABS: 180.0000 mg | ORAL_TABLET | Freq: Every day | ORAL | 11 refills | Status: DC

## 2016-07-15 MED ORDER — DEXLANSOPRAZOLE 60 MG PO CPDR: 60.0000 mg | DELAYED_RELEASE_CAPSULE | Freq: Every day | ORAL | 5 refills | Status: DC

## 2016-07-15 NOTE — Progress Notes (Signed)
Subjective:  Patient ID: Gregg Morgan, male    DOB: Sep 10, 1960  Age: 56 y.o. MRN: [id number]  CC: No chief complaint on file.   HPI Gregg Morgan presents for complete physical examination  follow-up of hypertension. Patient has no history of headache chest pain or shortness of breath . Patient also denies symptoms of TIA such as numbness weakness lateralizing. Patient checks  blood pressure at home and has not had any elevated readings recently. Patient denies side effects from his medication. States taking it regularly. Patient in for follow-up of elevated cholesterol. Doing well without complaints on current medication. Denies side effects of statin including myalgia and arthralgia and nausea. Also in today for liver function testing. Currently no chest pain, shortness of breath or other cardiovascular related symptoms noted.  He also has history of obstructive sleep apnea. He is using CPAP regularly.Patient has had some difficulties with his CPAP drying his throat and mouth without and is going in for re-titration as well on April 5 denies recent angina.Patient in for follow-up of GERD. Currently having heartburn 3-4 times a week at night. Wife says he'll wake up and have to sit on the side of the bed and belch and take an over-the-counter medicine in addition to the pantoprazole. Pantoprazole just does not seem to be helping. He also has chronic cough with some heartburn and acid as well. He does have a history of hiatal hernia.  Patient states she quit smoking last fall. Apparently 4-5 months ago.   History Gregg Morgan has a past medical history of Coronary artery disease; GERD (gastroesophageal reflux disease); Hyperlipidemia; Hypertension; and Obstructive sleep apnea.   He has a past surgical history that includes none; Finger surgery; Cardiac catheterization; and ORIF finger fracture (08/03/2011).   His family history includes Colon cancer in his father; Hypertension in his mother;  Thyroid disease in his mother.He reports that he quit smoking about 4 months ago. He has a 10.00 pack-year smoking history. He has never used smokeless tobacco. He reports that he drinks about 1.2 oz of alcohol per week . He reports that he does not use drugs.  Outpatient Medications Prior to Visit  Medication Sig Dispense Refill  . aspirin 81 MG tablet Take 81 mg by mouth at bedtime.     . Cholecalciferol (VITAMIN D) 2000 UNITS CAPS Take 1 capsule by mouth at bedtime.     . fish oil-omega-3 fatty acids 1000 MG capsule Take 1,000 mg by mouth at bedtime.     Marland Kitchen ibuprofen (ADVIL,MOTRIN) 800 MG tablet Take 1 tablet (800 mg total) by mouth every 8 (eight) hours as needed. 30 tablet 0  . lisinopril (PRINIVIL,ZESTRIL) 20 MG tablet Take 1 tablet (20 mg total) by mouth daily. 90 tablet 2  . pantoprazole (PROTONIX) 40 MG tablet Take 1 tablet (40 mg total) by mouth daily. 90 tablet 0  . rosuvastatin (CRESTOR) 5 MG tablet Take 1 tablet (5 mg total) by mouth daily. 30 tablet 5   No facility-administered medications prior to visit.     ROS Review of Systems  Constitutional: Negative for chills, diaphoresis and fever.  HENT: Positive for congestion, postnasal drip and rhinorrhea. Negative for sore throat.   Respiratory: Negative for cough, shortness of breath and wheezing.   Cardiovascular: Negative for chest pain.  Gastrointestinal: Negative for abdominal distention, abdominal pain, constipation, diarrhea, nausea and vomiting.  Genitourinary: Negative for dysuria and frequency.  Musculoskeletal: Negative for arthralgias and joint swelling.  Skin: Negative for rash.  Neurological: Negative for headaches.    Objective:  BP (!) 140/95   Pulse 70   Temp (!) 96.8 F (36 C) (Oral)   Ht '5\' 9"'  (1.753 m)   Wt 203 lb (92.1 kg)   BMI 29.98 kg/m   BP Readings from Last 3 Encounters:  07/15/16 (!) 140/95  06/26/16 128/72  06/04/16 125/85    Wt Readings from Last 3 Encounters:  07/15/16 203 lb (92.1  kg)  06/26/16 200 lb (90.7 kg)  06/04/16 200 lb (90.7 kg)     Physical Exam  Constitutional: He is oriented to person, place, and time. He appears well-developed and well-nourished.  HENT:  Head: Normocephalic and atraumatic.  Mouth/Throat: Oropharynx is clear and moist.  Eyes: EOM are normal. Pupils are equal, round, and reactive to light.  Neck: Normal range of motion. No tracheal deviation present. No thyromegaly present.  Cardiovascular: Normal rate, regular rhythm and normal heart sounds.  Exam reveals no gallop and no friction rub.   No murmur heard. Pulmonary/Chest: Breath sounds normal. He has no wheezes. He has no rales.  Abdominal: Soft. He exhibits no mass. There is no tenderness.  Musculoskeletal: Normal range of motion. He exhibits no edema.  Neurological: He is alert and oriented to person, place, and time.  Skin: Skin is warm and dry.  Psychiatric: He has a normal mood and affect.    Lab Results  Component Value Date   HGBA1C  10/09/2009    5.6 (NOTE)                                                                       According to the ADA Clinical Practice Recommendations for 2011, when HbA1c is used as a screening test:   >=6.5%   Diagnostic of Diabetes Mellitus           (if abnormal result  is confirmed)  5.7-6.4%   Increased risk of developing Diabetes Mellitus  References:Diagnosis and Classification of Diabetes Mellitus,Diabetes XUXY,3338,32(NVBTY 1):S62-S69 and Standards of Medical Care in         Diabetes - 2011,Diabetes Care,2011,34  (Suppl 1):S11-S61.    Lab Results  Component Value Date   WBC 6.4 01/15/2016   HGB 15.1 02/18/2013   HCT 44.2 01/15/2016   PLT 238 01/15/2016   GLUCOSE 87 01/15/2016   CHOL 182 01/15/2016   TRIG 168 (H) 01/15/2016   HDL 56 01/15/2016   LDLCALC 92 01/15/2016   ALT 34 01/15/2016   AST 23 01/15/2016   NA 139 01/15/2016   K 4.2 01/15/2016   CL 98 01/15/2016   CREATININE 0.84 01/15/2016   BUN 8 01/15/2016   CO2 25  01/15/2016   TSH 2.210 11/29/2014   PSA 0.7 02/18/2013   INR 1.00 10/09/2009   HGBA1C  10/09/2009    5.6 (NOTE)                                                                       According to  the ADA Clinical Practice Recommendations for 2011, when HbA1c is used as a screening test:   >=6.5%   Diagnostic of Diabetes Mellitus           (if abnormal result  is confirmed)  5.7-6.4%   Increased risk of developing Diabetes Mellitus  References:Diagnosis and Classification of Diabetes Mellitus,Diabetes JOAC,1660,63(KZSWF 1):S62-S69 and Standards of Medical Care in         Diabetes - 2011,Diabetes UXNA,3557,32  (Suppl 1):S11-S61.    Dg Shoulder Left  04/15/2014   CLINICAL DATA:  [place] [address] yesterday, tripping while walking. Fall onto left shoulder with subsequent pain.  EXAM: LEFT SHOULDER - 2+ VIEW  COMPARISON:  Chest radiography 08/01/2011  FINDINGS: There is AC joint separation with widening of the [facility] joint but no elevation of the distal clavicle. No fracture. Glenohumeral joint is normal.  IMPRESSION: AC separation with widening of the joint space but no elevation of the clavicle or fracture.   Electronically Signed   By: Nelson Chimes M.D.   On: 04/15/2014 16:32    Assessment & Plan:   Diagnoses and all orders for this visit:  Well adult exam -     CBC with Differential/Platelet -     CMP14+EGFR -     PSA Total (Reflex To Free) -     Urinalysis  Essential hypertension -     CBC with Differential/Platelet -     CMP14+EGFR -     Urinalysis -     lisinopril (PRINIVIL,ZESTRIL) 20 MG tablet; Take 1 tablet (20 mg total) by mouth daily.  Hyperlipidemia, unspecified hyperlipidemia type -     CBC with Differential/Platelet -     CMP14+EGFR -     Lipid panel  Coronary artery disease involving native coronary artery of native heart with angina pectoris (HCC) -     CBC with Differential/Platelet -     CMP14+EGFR  Obstructive sleep apnea on CPAP -     CBC with Differential/Platelet -      CMP14+EGFR  Gastroesophageal reflux disease without esophagitis -     CBC with Differential/Platelet -     CMP14+EGFR  Tobacco use disorder -     CBC with Differential/Platelet -     CMP14+EGFR  Other orders -     rosuvastatin (CRESTOR) 5 MG tablet; Take 1 tablet (5 mg total) by mouth daily. -     dexlansoprazole (DEXILANT) 60 MG capsule; Take 1 capsule (60 mg total) by mouth daily. On an empty stomach, then do not eat for 1 hour -     fexofenadine (ALLEGRA) 180 MG tablet; Take 1 tablet (180 mg total) by mouth daily. For allergy symptoms   I have discontinued Mr. Massman's ibuprofen and pantoprazole. I am also having him start on dexlansoprazole and fexofenadine. Additionally, I am having him maintain his aspirin, fish oil-omega-3 fatty acids, Vitamin D, rosuvastatin, and lisinopril.  Meds ordered this encounter  Medications  . rosuvastatin (CRESTOR) 5 MG tablet    Sig: Take 1 tablet (5 mg total) by mouth daily.    Dispense:  30 tablet    Refill:  5  . lisinopril (PRINIVIL,ZESTRIL) 20 MG tablet    Sig: Take 1 tablet (20 mg total) by mouth daily.    Dispense:  90 tablet    Refill:  2  . dexlansoprazole (DEXILANT) 60 MG capsule    Sig: Take 1 capsule (60 mg total) by mouth daily. On an empty stomach, then do not eat for 1 hour  Dispense:  30 capsule    Refill:  5  . fexofenadine (ALLEGRA) 180 MG tablet    Sig: Take 1 tablet (180 mg total) by mouth daily. For allergy symptoms    Dispense:  30 tablet    Refill:  11     Follow-up: Return for hypertension, cholesterol.  Claretta Fraise, M.D.

## 2016-07-16 ENCOUNTER — Other Ambulatory Visit: Payer: Self-pay | Admitting: Family Medicine

## 2016-07-16 LAB — CMP14+EGFR
ALBUMIN: 4.6 g/dL (ref 3.5–5.5)
ALK PHOS: 91 IU/L (ref 39–117)
ALT: 41 IU/L (ref 0–44)
AST: 33 IU/L (ref 0–40)
Albumin/Globulin Ratio: 2.1 (ref 1.2–2.2)
BUN / CREAT RATIO: 11 (ref 9–20)
BUN: 10 mg/dL (ref 6–24)
Bilirubin Total: 0.4 mg/dL (ref 0.0–1.2)
CO2: 25 mmol/L (ref 18–29)
CREATININE: 0.87 mg/dL (ref 0.76–1.27)
Calcium: 9.3 mg/dL (ref 8.7–10.2)
Chloride: 99 mmol/L (ref 96–106)
GFR, EST AFRICAN AMERICAN: 112 mL/min/{1.73_m2} (ref >59)
GFR, EST NON AFRICAN AMERICAN: 96 mL/min/{1.73_m2} (ref >59)
GLOBULIN, TOTAL: 2.2 g/dL (ref 1.5–4.5)
Glucose: 87 mg/dL (ref 65–99)
Potassium: 4.4 mmol/L (ref 3.5–5.2)
SODIUM: 140 mmol/L (ref 134–144)
TOTAL PROTEIN: 6.8 g/dL (ref 6.0–8.5)

## 2016-07-16 LAB — LIPID PANEL
CHOL/HDL RATIO: 4 ratio (ref 0.0–5.0)
CHOLESTEROL TOTAL: 214 mg/dL — AB (ref 100–199)
HDL: 53 mg/dL (ref >39)
LDL Calculated: 123 mg/dL — ABNORMAL HIGH (ref 0–99)
Triglycerides: 190 mg/dL — ABNORMAL HIGH (ref 0–149)
VLDL CHOLESTEROL CAL: 38 mg/dL (ref 5–40)

## 2016-07-16 LAB — CBC WITH DIFFERENTIAL/PLATELET
Basophils Absolute: 0.1 10*3/uL (ref 0.0–0.2)
Basos: 2 %
EOS (ABSOLUTE): 0.2 10*3/uL (ref 0.0–0.4)
EOS: 3 %
HEMATOCRIT: 42.5 % (ref 37.5–51.0)
HEMOGLOBIN: 14.4 g/dL (ref 13.0–17.7)
IMMATURE GRANS (ABS): 0 10*3/uL (ref 0.0–0.1)
Immature Granulocytes: 0 %
LYMPHS ABS: 2.1 10*3/uL (ref 0.7–3.1)
Lymphs: 35 %
MCH: 30.6 pg (ref 26.6–33.0)
MCHC: 33.9 g/dL (ref 31.5–35.7)
MCV: 90 fL (ref 79–97)
MONOCYTES: 8 %
Monocytes Absolute: 0.5 10*3/uL (ref 0.1–0.9)
NEUTROS ABS: 3 10*3/uL (ref 1.4–7.0)
Neutrophils: 52 %
Platelets: 245 10*3/uL (ref 150–379)
RBC: 4.7 x10E6/uL (ref 4.14–5.80)
RDW: 13.7 % (ref 12.3–15.4)
WBC: 5.9 10*3/uL (ref 3.4–10.8)

## 2016-07-16 LAB — PSA TOTAL (REFLEX TO FREE): PROSTATE SPECIFIC AG, SERUM: 0.7 ng/mL (ref 0.0–4.0)

## 2016-07-16 MED ORDER — ROSUVASTATIN CALCIUM 5 MG PO TABS: 10.0000 mg | ORAL_TABLET | Freq: Every day | ORAL | 5 refills | Status: DC

## 2016-07-18 ENCOUNTER — Ambulatory Visit (INDEPENDENT_AMBULATORY_CARE_PROVIDER_SITE_OTHER): Payer: BLUE CROSS/BLUE SHIELD | Admitting: Neurology

## 2016-07-18 DIAGNOSIS — G4720 Circadian rhythm sleep disorder, unspecified type: Secondary | ICD-10-CM

## 2016-07-18 DIAGNOSIS — G4733 Obstructive sleep apnea (adult) (pediatric): Secondary | ICD-10-CM

## 2016-07-25 ENCOUNTER — Telehealth: Payer: Self-pay

## 2016-07-25 NOTE — Progress Notes (Signed)
Diana:  Patient referred by Dr. Darlyn Read, seen by me on 06/26/16, split study on 07/18/16. Please call and notify patient that the recent sleep study confirmed the diagnosis of severe OSA. He did well with CPAP during the study with improvement of the respiratory events. Therefore, I would like to prescribe a new machine and supplies for him. I placed the order in the chart. He may have a DME already. The patient will need a follow up appointment with me in 10 weeks post set up that has to be scheduled; please go ahead and schedule while you have the patient on the phone and make sure patient understands the importance of keeping this window for the FU appointment, as it is often an insurance requirement and failing to adhere to this may result in losing coverage for sleep apnea treatment.  Please re-enforce the importance of compliance with treatment and the need for [place] to monitor compliance data - again an insurance requirement and good feedback for the patient as far as how they are doing.  Also remind patient, that any upcoming CPAP machine or mask issues, should be first addressed with the DME company. Please ask if patient has a preference regarding DME company.  Please arrange for CPAP set up at home through a DME company of patient's choice - once you have spoken to the patient - and faxed/routed report to PCP and referring MD (if other than PCP), you can close this encounter, thanks,   Huston Foley, MD, PhD [facility] ([facility])

## 2016-07-25 NOTE — Procedures (Signed)
PATIENT'S NAME:  Gregg Morgan, Gregg Morgan DOB:      [birth year]      MR#:    [id number]     DATE OF RECORDING: 07/18/2016 REFERRING M.D.:  Mechele Claude, MD Study Performed:  Split-Night Titration Study HISTORY: 56 year old man with a history of reflux disease, hypertension, hyperlipidemia, coronary artery disease, recent smoking cessation in 1/18, and overweight state, who was previously diagnosed with obstructive sleep apnea and placed on CPAP therapy. His machine is defective and he has not had supplies or a follow up for OSA in over 8 years. The patient endorsed the Epworth Sleepiness Scale at 12 points. The patient's weight 200 pounds with a height of 69 (inches), resulting in a BMI of 29.7 kg/m2. The patient's neck circumference measured 17 inches.  CURRENT MEDICATIONS: Aspirin, Advil, Lisinopril, Protonix, Crestor  PROCEDURE:  This is a multichannel digital polysomnogram utilizing the Somnostar 11.2 system.  Electrodes and sensors were applied and monitored per AASM Specifications.   EEG, EOG, Chin and Limb EMG, were sampled at 200 Hz.  ECG, Snore and Nasal Pressure, Thermal Airflow, Respiratory Effort, CPAP Flow and Pressure, Oximetry was sampled at 50 Hz. Digital video and audio were recorded.      BASELINE STUDY WITHOUT CPAP RESULTS:  Lights Out was at 22:32 and Lights On at 05:10.  Total recording time (TRT) was 124, with a total sleep time (TST) of 105.5 minutes.   The patient's sleep latency was 16.5 minutes.  REM latency was absent. The sleep efficiency was 85.1 %.    SLEEP ARCHITECTURE: WASO (Wake after sleep onset) was 3.5 minutes, Stage N1 was 9.5 minutes, Stage N2 was 36 minutes, Stage N3 was 60 minutes and Stage R (REM sleep) was 0 minutes.  The percentages were Stage N1 9.%, Stage N2 34.1%, Stage N3 56.9% and Stage R (REM sleep) was absent.    The arousals were noted as: 3 were spontaneous, 0 were associated with PLMs, 21 were associated with respiratory events.   Audio and video analysis  did not show any abnormal or unusual movements, behaviors, phonations or vocalizations.  The patient took bathroom breaks. Moderate snoring was noted. The EKG was in keeping with normal sinus rhythm (NSR).  RESPIRATORY ANALYSIS:  There were a total of 144 respiratory events:  52 obstructive apneas, 0 central apneas and 0 mixed apneas with a total of 52 apneas and an apnea index (AI) of 29.6. There were 92 hypopneas with a hypopnea index of 52.3. The patient also had 0 respiratory event related arousals (RERAs).  Snoring was noted.     The total APNEA/HYPOPNEA INDEX (AHI) was 81.9 /hour and the total RESPIRATORY DISTURBANCE INDEX was 81.9 /hour.  0 events occurred in REM sleep and 184 events in NREM. The REM AHI was n/a, /hour versus a non-REM AHI of 81.9 /hour. The patient spent 216.5 minutes sleep time in the supine position 141 minutes in non-supine. The supine AHI was 81.9 /hour versus a non-supine AHI of 0.0 /hour.  OXYGEN SATURATION & C02:  The wake baseline 02 saturation was 96%, with the lowest being 85%. Time spent below 89% saturation equaled 35 minutes.  PERIODIC LIMB MOVEMENTS: The patient had a total of 0 Periodic Limb Movements.  The Periodic Limb Movement (PLM) index was 0 /hour and the PLM Arousal index was 0 /hour.  TITRATION STUDY WITH CPAP RESULTS:   The patient was fitted with medium P10 nasal pillows. CPAP was initiated at 5 cmH20 with heated humidity per AASM split night  standards and pressure was advanced to 9 cmH20 because of hypopneas, apneas and desaturations.  At a PAP pressure of 9 cmH20, there was a reduction of the AHI to 13.3 /hour, brief, nonsupine REM sleep achieved, O2 nadir of 91%.   Total recording time (TRT) was 275 minutes, with a total sleep time (TST) of 252 minutes. The patient's sleep latency was 4.5 minutes. REM latency was 4 minutes.  The sleep efficiency was 91.6 %.    SLEEP ARCHITECTURE: Wake after sleep was 18 minutes, Stage N1 14.5 minutes, Stage N2 92  minutes, Stage N3 86.5 minutes and Stage R (REM sleep) 59 minutes. The percentages were: Stage N1 5.8%, Stage N2 36.5%, Stage N3 34.3% and Stage R (REM sleep) 23.4%.   The arousals were noted as: 18 were spontaneous, 0 were associated with PLMs, 1 were associated with respiratory events.  RESPIRATORY ANALYSIS:  There were a total of 2 respiratory events: 0 obstructive apneas, 0 central apneas and 0 mixed apneas with a total of 0 apneas and an apnea index (AI) of 0. There were 2 hypopneas with a hypopnea index of .5 /hour. The patient also had 0 respiratory event related arousals (RERAs).      The total APNEA/HYPOPNEA INDEX  (AHI) was .5 /hour and the total RESPIRATORY DISTURBANCE INDEX was .5 /hour.  2 events occurred in REM sleep and 0 events in NREM. The REM AHI was 2. /hour versus a non-REM AHI of 0 /hour. REM sleep was achieved on a pressure of  cm/h2o (AHI was  .) The patient spent 44% of total sleep time in the supine position. The supine AHI was 0.0 /hour, versus a non-supine AHI of 0.9/hour.  OXYGEN SATURATION & C02:  The wake baseline 02 saturation was 96%, with the lowest being 89%. Time spent below 89% saturation equaled 0 minutes.  PERIODIC LIMB MOVEMENTS: The patient had a total of 0 Periodic Limb Movements. The Periodic Limb Movement (PLM) index was 0 /hour and the PLM Arousal index was 0 /hour.   Post-study, the patient indicated that sleep was better than usual.  POLYSOMNOGRAPHY IMPRESSION :   1. Obstructive Sleep Apnea (OSA)  2. Dysfunctions associated with sleep stages or arousals from sleep  RECOMMENDATIONS:  1. This patient has severe obstructive sleep apnea and responded well on CPAP therapy. He had residual sleep disordered breathing on a pressure of 9 cm. Previously had been on 11 cm and per compliance download, his AHI was 4.5/hour. I will, therefore, start the patient on home CPAP treatment at a pressure of 12 cm via medium nasal pillows with heated humidity. The patient  should be reminded to be fully compliant with PAP therapy to improve sleep related symptoms and decrease long term cardiovascular risks. Please note that untreated obstructive sleep apnea carries additional perioperative morbidity. Patients with significant obstructive sleep apnea should receive perioperative PAP therapy and the surgeons and particularly the anesthesiologist should be informed of the diagnosis and the severity of the sleep disordered breathing. 2. This study shows sleep fragmentation and abnormal sleep stage percentages; these are nonspecific findings and per se do not signify an intrinsic sleep disorder or a cause for the patient's sleep-related symptoms. Causes include (but are not limited to) the first night effect of the sleep study, circadian rhythm disturbances, medication effect or an underlying mood disorder or medical problem.  3. The patient should be cautioned not to drive, work at heights, or operate dangerous or heavy equipment when tired or sleepy. Review and  reiteration of good sleep hygiene measures should be pursued with any patient. 4. The patient will be seen in follow-up by Dr. Frances Furbish at [facility] for discussion of the test results and further management strategies. The referring provider will be notified of the test results.   I certify that I have reviewed the entire raw data recording prior to the issuance of this report in accordance with the Standards of Accreditation of the American Academy of Sleep Medicine (AASM)       Huston Foley, MD ,PhD Diplomat, American Board of Psychiatry and Neurology (Neurology and Sleep Medicine)

## 2016-07-25 NOTE — Telephone Encounter (Signed)
-----   Message from Huston Foley, MD sent at 07/25/2016  8:34 AM EDT ----- Lafonda Mosses:  Patient referred by Dr. Darlyn Read, seen by me on 06/26/16, split study on 07/18/16. Please call and notify patient that the recent sleep study confirmed the diagnosis of severe OSA. He did well with CPAP during the study with improvement of the respiratory events. Therefore, I would like to prescribe a new machine and supplies for him. I placed the order in the chart. He may have a DME already. The patient will need a follow up appointment with me in 10 weeks post set up that has to be scheduled; please go ahead and schedule while you have the patient on the phone and make sure patient understands the importance of keeping this window for the FU appointment, as it is often an insurance requirement and failing to adhere to this may result in losing coverage for sleep apnea treatment.  Please re-enforce the importance of compliance with treatment and the need for [place] to monitor compliance data - again an insurance requirement and good feedback for the patient as far as how they are doing.  Also remind patient, that any upcoming CPAP machine or mask issues, should be first addressed with the DME company. Please ask if patient has a preference regarding DME company.  Please arrange for CPAP set up at home through a DME company of patient's choice - once you have spoken to the patient - and faxed/routed report to PCP and referring MD (if other than PCP), you can close this encounter, thanks,   Huston Foley, MD, PhD [facility] ([facility])

## 2016-07-25 NOTE — Addendum Note (Signed)
Addended by: Huston Foley on: 07/25/2016 08:34 AM   Modules accepted: Orders

## 2016-07-25 NOTE — Telephone Encounter (Signed)
I called pt. I advised pt that Dr. Frances Furbish reviewed their sleep study results and found that pt does have severe osa but did will with the cpap, which improved pt's respiratory events. Dr. Frances Furbish recommends that pt start a cpap at home. I reviewed PAP compliance expectations with the pt. Pt is agreeable to starting a CPAP. I advised pt that an order will be sent to a DME, [facility], and [facility] will call the pt within about one week after they file with the pt's insurance. [facility] will show the pt how to use the machine, fit for masks, and troubleshoot the CPAP if needed. A follow up appt was made for insurance purposes with Dr. Frances Furbish on July 3rd, 2018 at 3:00pm. Pt verbalized understanding to arrive 15 minutes early and bring their CPAP. A letter with all of this information in it will be mailed to the pt as a reminder. I verified with the pt that the address we have on file is correct. Pt verbalized understanding of results. Pt had no questions at this time but was encouraged to call back if questions arise.

## 2016-08-19 DIAGNOSIS — G4733 Obstructive sleep apnea (adult) (pediatric): Secondary | ICD-10-CM | POA: Diagnosis not present

## 2016-09-19 DIAGNOSIS — G4733 Obstructive sleep apnea (adult) (pediatric): Secondary | ICD-10-CM | POA: Diagnosis not present

## 2016-10-15 ENCOUNTER — Ambulatory Visit: Payer: Self-pay | Admitting: Neurology

## 2016-10-15 ENCOUNTER — Encounter: Payer: Self-pay | Admitting: Neurology

## 2016-10-15 ENCOUNTER — Ambulatory Visit (INDEPENDENT_AMBULATORY_CARE_PROVIDER_SITE_OTHER): Payer: BLUE CROSS/BLUE SHIELD | Admitting: Neurology

## 2016-10-15 VITALS — BP 124/82 | HR 58 | Ht 66.5 in | Wt 192.0 lb

## 2016-10-15 DIAGNOSIS — Z9989 Dependence on other enabling machines and devices: Secondary | ICD-10-CM

## 2016-10-15 DIAGNOSIS — G4733 Obstructive sleep apnea (adult) (pediatric): Secondary | ICD-10-CM

## 2016-10-15 NOTE — Progress Notes (Signed)
Subjective:    Patient ID: Gregg Morgan is a 56 y.o. male.  HPI     Interim history:   Gregg Morgan is a 56 year old right-handed gentleman with an underlying medical history of reflux disease, hypertension, hyperlipidemia, coronary artery disease, recent smoking cessation in 1/18, and overweight state, who presents for follow-up consultation of his obstructive sleep apnea, after recent sleep study testing. He is accompanied by his wife today. I first met him on 06/26/2016 at the request of his primary care physician, at which time the patient reported a prior diagnosis of sleep apnea and CPAP therapy was started years ago. He had not seen a sleep specialist in many years. He was previously diagnosed with severe obstructive sleep apnea and I invited him for repeat sleep study testing to update his diagnosis and his supplies. He was not fully compliant with his CPAP therapy at the time. He had a split-night sleep study on 07/18/2016. I went over his test results with him in detail today. Baseline sleep efficiency was 85.1%, REM sleep was absent. His total AHI was 81.9 per hour. Average oxygen saturation was 96%, nadir was 85%. He did not have any significant PLMS. He was fitted with medium nasal pillows. CPAP was titrated from 5 cm to 9 cm. On the final pressure his AHI was 13.3 per hour, brief nonsupine REM sleep was achieved, O2 nadir of 91%. He did not have any significant PLMS during the second part of the study. Based on the fact that his prior compliance download on a pressure of 11 cm was indicative of an AHI of 4.5 per hour and the titration portion of our sleep study showed residual sleep apnea on a pressure of 9 cm, I suggested a home CPAP pressure of 12 cm via medium nasal pillows.  Today, 10/15/2016 (all dictated new, as well as above notes, some dictation done in note pad or Word, outside of chart, may appear as copied):  I reviewed his CPAP compliance data from 09/14/2016 through  10/13/2016, which is a total of 30 days, during which time he used his machine every night with the exception of one night, percent used days greater than 4 hours at 97%, indicating excellent compliance, average usage of 6 hours and 33 minutes, residual AHI acceptable at 4 per hour, leak on the lower side with the 95th percentile at 7.8 L/m a pressure of 12 cm with EPR of 3. He reports doing well. He tolerates this mask and machine much better. He is compliant with it. He feels improved with regards to his sleep. He does not always allow for 7 or 8 hours of sleep, likes to go to bed after it is dark but has to get up around 4 AM for work. His wife reports that she will try to get him to go to bed a little earlier. He has had no recent medication changes or medical history changes.  The patient's allergies, current medications, family history, past medical history, past social history, past surgical history and problem list were reviewed and updated as appropriate.   Previously (copied from previous notes for reference):   06/26/2016: He was previously diagnosed with obstructive sleep apnea and placed on CPAP therapy. I reviewed your office note from 06/04/2016. He has been on CPAP but needs new supplies and his machine has a defective humidifier. His mask has not been changed and while, he has not had a follow-up with his sleep specialist for over 8 years. His Epworth sleepiness  score is 12 out of 24, fatigue score is 25 out of 63. Last sleep study was in 2011, sleep study results are not available for my review today but the patient's wife has records handwritten and paperwork from the [place] which indicated an AHI of 48 per hour, REM AHI of 70 per hour, O2 nadir of 72%. This indicates severe sleep apnea. I reviewed his CPAP compliance data from 05/05/2016 through 06/03/2016, which is a total of 30 days, during which time he used his device only 12 days with percent used days greater than 4 hours at  36.7%, indicating suboptimal compliance, average AHI of 4.5 per hour, CPAP pressure at 11 cm, leak low. He has not been able to use his CPAP for the past few weeks as he needs new supplies. He takes no narcotic pain medication and has been taking ibuprofen as needed for knee pain. His knee pain improved. His wife reports that he snores loudly and has pauses in his breathing. He denies any restless legs type symptoms. His sister has sleep apnea and uses a CPAP machine. He endorses nocturia about once per average night, morning headaches occasionally. Bedtime is between 9 and 9:30 PM, he falls asleep quickly. TV is on in the beginning but wife turns it off, he does not care one way or another for TV at night. His wake up time is early at 3:30 AM. He has to be at work at 5 AM. He works at a Therap[employer], sports as a Librarian, academic. He works from 5 AM to 3 PM, sometimes 6 days a week. He lives at home with his wife in 46 year old daughter. He drinks alcohol occasionally and caffeine in the form of diet Dr. Malachi Bonds, 2 cans per day on average. He denies any parasomnias or leg twitching at night. They have a small dog in the bedroom and typically on the bed at night.   His Past Medical History Is Significant For: Past Medical History:  Diagnosis Date  . Coronary artery disease   . GERD (gastroesophageal reflux disease)   . Hyperlipidemia   . Hypertension   . Obstructive sleep apnea     His Past Surgical History Is Significant For: Past Surgical History:  Procedure Laterality Date  . CARDIAC CATHETERIZATION     09/2009--DR  BERRY  . FINGER SURGERY    . none    . ORIF FINGER FRACTURE  08/03/2011   Procedure: OPEN REDUCTION INTERNAL FIXATION (ORIF) METACARPAL (FINGER) FRACTURE;  Surgeon: Linna Hoff, MD;  Location: [place];  Service: Orthopedics;  Laterality: Right;  RIGHT THUMB ORIF    His Family History Is Significant For: Family History  Problem Relation Age of Onset  . Colon cancer Father   . Thyroid  disease Mother   . Hypertension Mother     His Social History Is Significant For: Social History   Social History  . Marital status: Married    Spouse name: N/A  . Number of children: 1  . Years of education: 91   Occupational History  . machine operator [employer]   Social History Main Topics  . Smoking status: Former Smoker    Packs/day: 0.50    Years: 20.00    Quit date: 03/12/2016  . Smokeless tobacco: Never Used  . Alcohol use 1.2 oz/week    2 Cans of beer per week     Comment: Occasion on weekends  . Drug use: No  . Sexual activity: Not Asked   Other Topics  Concern  . None   Social History Narrative  . None    His Allergies Are:  No Known Allergies:   His Current Medications Are:  Outpatient Encounter Prescriptions as of 10/15/2016  Medication Sig  . aspirin 81 MG tablet Take 81 mg by mouth at bedtime.   . Cholecalciferol (VITAMIN D) 2000 UNITS CAPS Take 1 capsule by mouth at bedtime.   Marland Kitchen dexlansoprazole (DEXILANT) 60 MG capsule Take 1 capsule (60 mg total) by mouth daily. On an empty stomach, then do not eat for 1 hour  . fexofenadine (ALLEGRA) 180 MG tablet Take 1 tablet (180 mg total) by mouth daily. For allergy symptoms  . fish oil-omega-3 fatty acids 1000 MG capsule Take 1,000 mg by mouth at bedtime.   Marland Kitchen lisinopril (PRINIVIL,ZESTRIL) 20 MG tablet Take 1 tablet (20 mg total) by mouth daily.  . rosuvastatin (CRESTOR) 5 MG tablet Take 2 tablets (10 mg total) by mouth daily.   No facility-administered encounter medications on file as of 10/15/2016.   : Review of Systems:  Out of a complete 14 point review of systems, all are reviewed and negative with the exception of these symptoms as listed below:  Review of Systems  Neurological:       Pt presents today to discuss his cpap compliance. Pt reports that his snoring has resolved under cpap therapy.    Objective:  Neurological Exam  Physical Exam Physical Examination:   Vitals:   10/15/16 1156  BP:  124/82  Pulse: (!) 58    General Examination: The patient is a very pleasant 56 y.o. male in no acute distress. He appears well-developed and well-nourished and well groomed.   HEENT: Normocephalic, atraumatic, pupils are equal, round and reactive to light and accommodation. Extraocular tracking is good without limitation to gaze excursion or nystagmus noted. Normal smooth pursuit is noted. Hearing is grossly intact. Face is symmetric with normal facial animation and normal facial sensation. Speech is clear with no dysarthria noted. There is no hypophonia. There is no lip, neck/head, jaw or voice tremor. Neck is supple with full range of passive and active motion. There are no carotid bruits on auscultation. Oropharynx exam reveals: mild mouth dryness, adequate to marginal dental hygiene and marked airway crowding w tonsils of about 3+. Mallampati is class II. Tongue protrudes centrally and palate elevates symmetrically. Mild redness right nostril but no sore.  Chest: Clear to auscultation without wheezing, rhonchi or crackles noted.  Heart: S1+S2+0, regular and normal without murmurs, rubs or gallops noted.   Abdomen: Soft, non-tender and non-distended with normal bowel sounds appreciated on auscultation.  Extremities: There is no pitting edema in the distal lower extremities bilaterally. Pedal pulses are intact.  Skin: Warm and dry without trophic changes noted.  Musculoskeletal: exam reveals no obvious joint deformities, tenderness or joint swelling or erythema.   Neurologically:  Mental status: The patient is awake, alert and oriented in all 4 spheres. His immediate and remote memory, attention, language skills and fund of knowledge are appropriate. There is no evidence of aphasia, agnosia, apraxia or anomia. Speech is clear with normal prosody and enunciation. Thought process is linear. Mood is normal and affect is normal.  Cranial nerves II - XII are as described above under HEENT  exam. In addition: shoulder shrug is normal with equal shoulder height noted. Motor exam: Normal bulk, strength and tone is noted. There is no drift, tremor or rebound. Romberg is negative. Reflexes are 1-2+ throughout. Fine motor skills and coordination:  frossly intact.  Cerebellar testing: No dysmetria or intention tremor. There is no truncal or gait ataxia.  Sensory exam: intact to light touch in the upper and lower extremities.  Gait, station and balance: He stands easily. No veering to one side is noted. No leaning to one side is noted. Posture is age-appropriate and stance is narrow based. Gait shows normal stride length and normal pace. No problems turning are noted. Tandem walk is unremarkable.   Assessment and Plan:   In summary, Gregg Morgan is a very pleasant 56 year old male with an underlying medical history of reflux disease, hypertension, hyperlipidemia, coronary artery disease, recent smoking cessation in 1/18, and overweight state, whoPresents for follow-up consultation of his obstructive sleep apnea, after recent retesting. He carries a prior diagnosis of OSA but was not fully compliant with his old CPAP machine. He had a split-night sleep study on 07/18/2016 which confirmed severe obstructive sleep apnea with a baseline AHI of 81.9 per hour, O2 nadir of 85%. He had good results with CPAP. He's currently on a pressure of 12 cm via nasal pillows and is compliant with treatment and feels improved. He's willing to continue with the current treatment settings and the current interface. He is commended for his treatment adherence. Since he is not a novice to CPAP therapy he is stable enough for A1c her checkup. We talked about his sleep study results and also reviewed his current 30 day compliance data together. I answered all her questions today, physical exam was stable.  I spent 20 minutes in total face-to-face time with the patient, more than 50% of which was spent in counseling and  coordination of care, reviewing test results, reviewing medication and discussing or reviewing the diagnosis of OSA, its prognosis and treatment options. Pertinent laboratory and imaging test results that were available during this visit with the patient were reviewed by me and considered in my medical decision making (see chart for details).

## 2016-10-15 NOTE — Patient Instructions (Signed)

## 2016-10-19 DIAGNOSIS — G4733 Obstructive sleep apnea (adult) (pediatric): Secondary | ICD-10-CM | POA: Diagnosis not present

## 2016-11-19 ENCOUNTER — Telehealth: Payer: Self-pay | Admitting: Family Medicine

## 2016-11-19 DIAGNOSIS — G4733 Obstructive sleep apnea (adult) (pediatric): Secondary | ICD-10-CM | POA: Diagnosis not present

## 2016-11-19 MED ORDER — ESOMEPRAZOLE MAGNESIUM 40 MG PO CPDR: 40.0000 mg | DELAYED_RELEASE_CAPSULE | Freq: Every day | ORAL | 3 refills | Status: DC

## 2016-11-19 NOTE — Telephone Encounter (Signed)
Covering for pcp while he is out of the office.  Pt has tried omep, ppantop previously with breakthrough symps. Dexilant is the next natural choice but states it is too expensive.   Trial of esomeprazole on an empty stomach.   Murtis SinkSam Manning Luna, MD [facility] 11/19/2016, 7:18 PM

## 2016-11-19 NOTE — Telephone Encounter (Signed)
Pt went to get the Dexilant yesterday and it is now $90 a month. Is there anything else that is cheaper?

## 2016-11-20 NOTE — Telephone Encounter (Signed)
Patient aware, per message left on voice mail, script is ready.  Take medication 30 minutes prior to eating. Call if any concerns or problems.

## 2016-12-19 ENCOUNTER — Ambulatory Visit: Payer: BLUE CROSS/BLUE SHIELD | Admitting: Neurology

## 2016-12-20 DIAGNOSIS — G4733 Obstructive sleep apnea (adult) (pediatric): Secondary | ICD-10-CM | POA: Diagnosis not present

## 2017-01-11 ENCOUNTER — Other Ambulatory Visit: Payer: Self-pay | Admitting: Family Medicine

## 2017-01-19 DIAGNOSIS — G4733 Obstructive sleep apnea (adult) (pediatric): Secondary | ICD-10-CM | POA: Diagnosis not present

## 2017-02-19 DIAGNOSIS — G4733 Obstructive sleep apnea (adult) (pediatric): Secondary | ICD-10-CM | POA: Diagnosis not present

## 2017-03-03 ENCOUNTER — Other Ambulatory Visit: Payer: Self-pay

## 2017-03-03 MED ORDER — ESOMEPRAZOLE MAGNESIUM 40 MG PO CPDR: 40.0000 mg | DELAYED_RELEASE_CAPSULE | Freq: Every day | ORAL | 0 refills | Status: DC

## 2017-03-03 NOTE — Telephone Encounter (Signed)
Last seen 07/15/16 Dr Darlyn ReadStacks

## 2017-03-21 DIAGNOSIS — G4733 Obstructive sleep apnea (adult) (pediatric): Secondary | ICD-10-CM | POA: Diagnosis not present

## 2017-04-21 DIAGNOSIS — G4733 Obstructive sleep apnea (adult) (pediatric): Secondary | ICD-10-CM | POA: Diagnosis not present

## 2017-05-01 ENCOUNTER — Other Ambulatory Visit: Payer: Self-pay | Admitting: Family Medicine

## 2017-05-01 DIAGNOSIS — I10 Essential (primary) hypertension: Secondary | ICD-10-CM

## 2017-05-01 NOTE — Telephone Encounter (Signed)
Authorize 30 days only. Then contact the patient letting them know that they will need an appointment before any further prescriptions can be sent in. 

## 2017-05-01 NOTE — Telephone Encounter (Signed)
Last seen 07/15/16 Dr Stacks 

## 2017-05-22 DIAGNOSIS — G4733 Obstructive sleep apnea (adult) (pediatric): Secondary | ICD-10-CM | POA: Diagnosis not present

## 2017-06-05 ENCOUNTER — Other Ambulatory Visit: Payer: Self-pay | Admitting: Family Medicine

## 2017-06-06 NOTE — Telephone Encounter (Signed)
Last seen 07/15/16 Dr Stacks 

## 2017-06-08 NOTE — Telephone Encounter (Signed)
Authorize 30 days only. Then contact the patient letting them know that they will need an appointment before any further prescriptions can be sent in. 

## 2017-08-02 ENCOUNTER — Other Ambulatory Visit: Payer: Self-pay | Admitting: Family Medicine

## 2017-08-02 DIAGNOSIS — I10 Essential (primary) hypertension: Secondary | ICD-10-CM

## 2017-08-19 ENCOUNTER — Other Ambulatory Visit: Payer: Self-pay | Admitting: Family Medicine

## 2017-08-19 DIAGNOSIS — I10 Essential (primary) hypertension: Secondary | ICD-10-CM

## 2017-09-04 ENCOUNTER — Other Ambulatory Visit: Payer: Self-pay | Admitting: Family Medicine

## 2017-10-20 ENCOUNTER — Ambulatory Visit: Payer: BLUE CROSS/BLUE SHIELD | Admitting: Adult Health

## 2017-10-21 ENCOUNTER — Encounter: Payer: Self-pay | Admitting: Adult Health

## 2017-10-28 ENCOUNTER — Ambulatory Visit (INDEPENDENT_AMBULATORY_CARE_PROVIDER_SITE_OTHER): Payer: BLUE CROSS/BLUE SHIELD | Admitting: Family Medicine

## 2017-10-28 ENCOUNTER — Encounter: Payer: Self-pay | Admitting: Family Medicine

## 2017-10-28 VITALS — BP 126/76 | HR 68 | Temp 97.7°F | Ht 66.5 in | Wt 186.5 lb

## 2017-10-28 DIAGNOSIS — Z1211 Encounter for screening for malignant neoplasm of colon: Secondary | ICD-10-CM

## 2017-10-28 DIAGNOSIS — N500 Atrophy of testis: Secondary | ICD-10-CM

## 2017-10-28 DIAGNOSIS — Z125 Encounter for screening for malignant neoplasm of prostate: Secondary | ICD-10-CM

## 2017-10-28 DIAGNOSIS — E785 Hyperlipidemia, unspecified: Secondary | ICD-10-CM

## 2017-10-28 DIAGNOSIS — I10 Essential (primary) hypertension: Secondary | ICD-10-CM

## 2017-10-28 DIAGNOSIS — Z0000 Encounter for general adult medical examination without abnormal findings: Secondary | ICD-10-CM

## 2017-10-28 LAB — URINALYSIS
Bilirubin, UA: NEGATIVE
Glucose, UA: NEGATIVE
KETONES UA: NEGATIVE
Leukocytes, UA: NEGATIVE
NITRITE UA: NEGATIVE
PH UA: 7 (ref 5.0–7.5)
SPEC GRAV UA: 1.02 (ref 1.005–1.030)
Urobilinogen, Ur: 1 mg/dL (ref 0.2–1.0)

## 2017-10-28 MED ORDER — LISINOPRIL 20 MG PO TABS: 20.0000 mg | ORAL_TABLET | Freq: Every day | ORAL | 1 refills | Status: DC

## 2017-10-28 MED ORDER — PANTOPRAZOLE SODIUM 40 MG PO TBEC: 40.0000 mg | DELAYED_RELEASE_TABLET | Freq: Every day | ORAL | 3 refills | Status: DC

## 2017-10-28 MED ORDER — ROSUVASTATIN CALCIUM 5 MG PO TABS: 10.0000 mg | ORAL_TABLET | Freq: Every day | ORAL | 1 refills | Status: DC

## 2017-10-28 NOTE — Patient Instructions (Signed)

## 2017-10-29 ENCOUNTER — Other Ambulatory Visit: Payer: Self-pay | Admitting: Family Medicine

## 2017-10-29 ENCOUNTER — Other Ambulatory Visit: Payer: Self-pay

## 2017-10-29 LAB — CMP14+EGFR
A/G RATIO: 2 (ref 1.2–2.2)
ALK PHOS: 88 IU/L (ref 39–117)
ALT: 30 IU/L (ref 0–44)
AST: 23 IU/L (ref 0–40)
Albumin: 4.9 g/dL (ref 3.5–5.5)
BUN/Creatinine Ratio: 14 (ref 9–20)
BUN: 13 mg/dL (ref 6–24)
Bilirubin Total: 0.5 mg/dL (ref 0.0–1.2)
CALCIUM: 9.8 mg/dL (ref 8.7–10.2)
CHLORIDE: 100 mmol/L (ref 96–106)
CO2: 21 mmol/L (ref 20–29)
Creatinine, Ser: 0.92 mg/dL (ref 0.76–1.27)
GFR calc Af Amer: 106 mL/min/{1.73_m2} (ref >59)
GFR, EST NON AFRICAN AMERICAN: 92 mL/min/{1.73_m2} (ref >59)
GLOBULIN, TOTAL: 2.4 g/dL (ref 1.5–4.5)
Glucose: 85 mg/dL (ref 65–99)
Potassium: 4.5 mmol/L (ref 3.5–5.2)
Sodium: 140 mmol/L (ref 134–144)
Total Protein: 7.3 g/dL (ref 6.0–8.5)

## 2017-10-29 LAB — CBC WITH DIFFERENTIAL/PLATELET
BASOS: 1 %
Basophils Absolute: 0.1 10*3/uL (ref 0.0–0.2)
EOS (ABSOLUTE): 0.1 10*3/uL (ref 0.0–0.4)
Eos: 1 %
Hematocrit: 48.4 % (ref 37.5–51.0)
Hemoglobin: 16.6 g/dL (ref 13.0–17.7)
IMMATURE GRANULOCYTES: 0 %
Immature Grans (Abs): 0 10*3/uL (ref 0.0–0.1)
LYMPHS ABS: 2 10*3/uL (ref 0.7–3.1)
Lymphs: 30 %
MCH: 32.5 pg (ref 26.6–33.0)
MCHC: 34.3 g/dL (ref 31.5–35.7)
MCV: 95 fL (ref 79–97)
MONOS ABS: 0.6 10*3/uL (ref 0.1–0.9)
Monocytes: 10 %
NEUTROS PCT: 58 %
Neutrophils Absolute: 3.8 10*3/uL (ref 1.4–7.0)
PLATELETS: 249 10*3/uL (ref 150–450)
RBC: 5.1 x10E6/uL (ref 4.14–5.80)
RDW: 13.7 % (ref 12.3–15.4)
WBC: 6.5 10*3/uL (ref 3.4–10.8)

## 2017-10-29 LAB — TESTOSTERONE,FREE AND TOTAL
Testosterone, Free: 4.2 pg/mL — ABNORMAL LOW (ref 7.2–24.0)
Testosterone: 113 ng/dL — ABNORMAL LOW (ref 264–916)

## 2017-10-29 LAB — VITAMIN D 25 HYDROXY (VIT D DEFICIENCY, FRACTURES): Vit D, 25-Hydroxy: 24.2 ng/mL — ABNORMAL LOW (ref 30.0–100.0)

## 2017-10-29 LAB — LIPID PANEL
CHOLESTEROL TOTAL: 321 mg/dL — AB (ref 100–199)
Chol/HDL Ratio: 6.2 ratio — ABNORMAL HIGH (ref 0.0–5.0)
HDL: 52 mg/dL (ref >39)
LDL CALC: 217 mg/dL — AB (ref 0–99)
Triglycerides: 258 mg/dL — ABNORMAL HIGH (ref 0–149)
VLDL CHOLESTEROL CAL: 52 mg/dL — AB (ref 5–40)

## 2017-10-29 LAB — PSA, TOTAL AND FREE
PSA FREE PCT: 30 %
PSA FREE: 0.24 ng/mL
Prostate Specific Ag, Serum: 0.8 ng/mL (ref 0.0–4.0)

## 2017-10-29 MED ORDER — VITAMIN D (ERGOCALCIFEROL) 1.25 MG (50000 UNIT) PO CAPS: 50000.0000 [IU] | ORAL_CAPSULE | ORAL | 3 refills | Status: DC

## 2017-10-29 MED ORDER — ROSUVASTATIN CALCIUM 20 MG PO TABS: 20.0000 mg | ORAL_TABLET | Freq: Every day | ORAL | 2 refills | Status: DC

## 2017-10-29 NOTE — Progress Notes (Signed)
Subjective:  Patient ID: Gregg Morgan, male    DOB: Feb 04, 1961  Age: 57 y.o. MRN: [id number]  CC: Annual Exam   HPI Bright Spielmann Durall presents for Complete physical. Followed for cholesterol, hypertension. Past due for colonoscopy. Haviing some symptoms of reflux.  Depression screen [facility] 2/9 10/28/2017 07/15/2016 06/04/2016  Decreased Interest 0 0 0  Down, Depressed, Hopeless 0 0 0  PHQ - 2 Score 0 0 0    History Yoshiharu has a past medical history of Coronary artery disease, GERD (gastroesophageal reflux disease), Hyperlipidemia, Hypertension, and Obstructive sleep apnea.   He has a past surgical history that includes none; Finger surgery; Cardiac catheterization; and ORIF finger fracture (08/03/2011).   His family history includes Colon cancer in his father; Hypertension in his mother; Thyroid disease in his mother.He reports that he quit smoking about 19 months ago. He has a 10.00 pack-year smoking history. He has never used smokeless tobacco. He reports that he drinks about 1.2 oz of alcohol per week. He reports that he does not use drugs.    ROS Review of Systems  Constitutional: Negative.  Negative for activity change, fatigue and unexpected weight change.  HENT: Negative.  Negative for congestion, ear pain, hearing loss, postnasal drip and trouble swallowing.   Eyes: Negative for pain and visual disturbance.  Respiratory: Negative for cough, chest tightness and shortness of breath.   Cardiovascular: Negative for chest pain, palpitations and leg swelling.  Gastrointestinal: Negative for abdominal distention, abdominal pain, blood in stool, constipation, diarrhea, nausea and vomiting.  Endocrine: Negative for cold intolerance, heat intolerance and polydipsia.  Genitourinary: Negative for difficulty urinating, dysuria, flank pain, frequency and urgency.  Musculoskeletal: Negative for arthralgias, joint swelling and myalgias.  Skin: Negative for color change, rash and wound.    Neurological: Negative for dizziness, syncope, speech difficulty, weakness, light-headedness, numbness and headaches.  Hematological: Does not bruise/bleed easily.  Psychiatric/Behavioral: Negative for confusion, decreased concentration, dysphoric mood and sleep disturbance. The patient is not nervous/anxious.     Objective:  BP 126/76   Pulse 68   Temp 97.7 F (36.5 C) (Oral)   Ht 5' 6.5" (1.689 m)   Wt 186 lb 8 oz (84.6 kg)   BMI 29.65 kg/m   BP Readings from Last 3 Encounters:  10/28/17 126/76  10/15/16 124/82  07/15/16 135/88    Wt Readings from Last 3 Encounters:  10/28/17 186 lb 8 oz (84.6 kg)  10/15/16 192 lb (87.1 kg)  07/15/16 203 lb (92.1 kg)     Physical Exam  Constitutional: He is oriented to person, place, and time. He appears well-developed and well-nourished. No distress.  HENT:  Head: Normocephalic and atraumatic.  Right Ear: External ear normal.  Left Ear: External ear normal.  Nose: Nose normal.  Mouth/Throat: Oropharynx is clear and moist.  Eyes: Pupils are equal, round, and reactive to light. Conjunctivae and EOM are normal.  Neck: Normal range of motion. Neck supple. No tracheal deviation present. No thyromegaly present.  Cardiovascular: Normal rate, regular rhythm and normal heart sounds. Exam reveals no gallop and no friction rub.  No murmur heard. Pulmonary/Chest: Effort normal and breath sounds normal. No respiratory distress. He has no wheezes. He has no rales.  Abdominal: Soft. Bowel sounds are normal. He exhibits no distension and no mass. There is no tenderness. Hernia confirmed negative in the right inguinal area and confirmed negative in the left inguinal area.  Genitourinary: Penis normal. Right testis shows no mass and no tenderness. Left  testis shows no mass and no tenderness.     Musculoskeletal: Normal range of motion. He exhibits no edema.  Lymphadenopathy:    He has no cervical adenopathy.  Neurological: He is alert and oriented  to person, place, and time. He has normal reflexes.  Skin: Skin is warm and dry.  Psychiatric: He has a normal mood and affect. His behavior is normal. Judgment and thought content normal.      Assessment & Plan:   Walter was seen today for annual exam.  Diagnoses and all orders for this visit:  Well adult exam -     CBC with Differential/Platelet -     CMP14+EGFR -     Lipid panel -     VITAMIN D 25 Hydroxy (Vit-D Deficiency, Fractures) -     Testosterone,Free and Total -     PSA, total and free -     Urinalysis  Essential hypertension -     lisinopril (PRINIVIL,ZESTRIL) 20 MG tablet; Take 1 tablet (20 mg total) by mouth daily.  Hyperlipidemia, unspecified hyperlipidemia type -     CBC with Differential/Platelet -     CMP14+EGFR -     Lipid panel  Screening for colon cancer -     Ambulatory referral to Gastroenterology  Screening for prostate cancer -     PSA, total and free  Testicular atrophy -     Cancel: US Scrotum; Future -     Cancel: US Scrotum; Future -     US SCROTUM W/DOPPLER; Future  Other orders -     pantoprazole (PROTONIX) 40 MG tablet; Take 1 tablet (40 mg total) by mouth daily. -     Discontinue: rosuvastatin (CRESTOR) 5 MG tablet; Take 2 tablets (10 mg total) by mouth daily.       I have discontinued Lanny Hurst L. Maines's dexlansoprazole, rosuvastatin, and esomeprazole. I am also having him start on pantoprazole. Additionally, I am having him maintain his aspirin, fish oil-omega-3 fatty acids, Vitamin D, fexofenadine, and lisinopril.  Allergies as of 10/28/2017   No Known Allergies     Medication List        Accurate as of 10/28/17 11:59 PM. Always use your most recent med list.          aspirin 81 MG tablet Take 81 mg by mouth at bedtime.   fexofenadine 180 MG tablet Commonly known as:  ALLEGRA Take 1 tablet (180 mg total) by mouth daily. For allergy symptoms   fish oil-omega-3 fatty acids 1000 MG capsule Take 1,000 mg by mouth at  bedtime.   lisinopril 20 MG tablet Commonly known as:  PRINIVIL,ZESTRIL Take 1 tablet (20 mg total) by mouth daily.   pantoprazole 40 MG tablet Commonly known as:  PROTONIX Take 1 tablet (40 mg total) by mouth daily.   rosuvastatin 5 MG tablet Commonly known as:  CRESTOR Take 2 tablets (10 mg total) by mouth daily.   Vitamin D 2000 units Caps Take 1 capsule by mouth at bedtime.        Follow-up: Return in about 6 months (around 04/30/2018).  Claretta Fraise, M.D.

## 2017-10-31 ENCOUNTER — Ambulatory Visit (HOSPITAL_COMMUNITY): Admission: RE | Admit: 2017-10-31 | Payer: BLUE CROSS/BLUE SHIELD | Source: Ambulatory Visit

## 2017-11-04 DIAGNOSIS — G4733 Obstructive sleep apnea (adult) (pediatric): Secondary | ICD-10-CM | POA: Diagnosis not present

## 2017-11-12 ENCOUNTER — Other Ambulatory Visit: Payer: Self-pay

## 2017-11-12 ENCOUNTER — Telehealth: Payer: Self-pay | Admitting: Internal Medicine

## 2017-11-12 DIAGNOSIS — Z1211 Encounter for screening for malignant neoplasm of colon: Secondary | ICD-10-CM

## 2017-11-12 NOTE — Telephone Encounter (Signed)
There is no record of pt ever having colonoscopy. The procedure done in 2012 was an EGD.

## 2017-11-12 NOTE — Telephone Encounter (Signed)
Received referral for patient to have recall colon. Patient's last colon was 05.03.2012 with Dr.Kaplan. There is no recall or letter in the system. DOD for today is Dr.Perry. Please advise on scheduling.

## 2017-11-25 DIAGNOSIS — M9905 Segmental and somatic dysfunction of pelvic region: Secondary | ICD-10-CM | POA: Diagnosis not present

## 2017-11-25 DIAGNOSIS — M9904 Segmental and somatic dysfunction of sacral region: Secondary | ICD-10-CM | POA: Diagnosis not present

## 2017-11-25 DIAGNOSIS — M5430 Sciatica, unspecified side: Secondary | ICD-10-CM | POA: Diagnosis not present

## 2017-11-25 DIAGNOSIS — M9903 Segmental and somatic dysfunction of lumbar region: Secondary | ICD-10-CM | POA: Diagnosis not present

## 2017-11-26 DIAGNOSIS — M9905 Segmental and somatic dysfunction of pelvic region: Secondary | ICD-10-CM | POA: Diagnosis not present

## 2017-11-26 DIAGNOSIS — M9904 Segmental and somatic dysfunction of sacral region: Secondary | ICD-10-CM | POA: Diagnosis not present

## 2017-11-26 DIAGNOSIS — M9903 Segmental and somatic dysfunction of lumbar region: Secondary | ICD-10-CM | POA: Diagnosis not present

## 2017-11-26 DIAGNOSIS — M5430 Sciatica, unspecified side: Secondary | ICD-10-CM | POA: Diagnosis not present

## 2017-11-27 DIAGNOSIS — M9903 Segmental and somatic dysfunction of lumbar region: Secondary | ICD-10-CM | POA: Diagnosis not present

## 2017-11-27 DIAGNOSIS — M9904 Segmental and somatic dysfunction of sacral region: Secondary | ICD-10-CM | POA: Diagnosis not present

## 2017-11-27 DIAGNOSIS — M5430 Sciatica, unspecified side: Secondary | ICD-10-CM | POA: Diagnosis not present

## 2017-11-27 DIAGNOSIS — M9905 Segmental and somatic dysfunction of pelvic region: Secondary | ICD-10-CM | POA: Diagnosis not present

## 2017-12-01 DIAGNOSIS — M9903 Segmental and somatic dysfunction of lumbar region: Secondary | ICD-10-CM | POA: Diagnosis not present

## 2017-12-01 DIAGNOSIS — M9904 Segmental and somatic dysfunction of sacral region: Secondary | ICD-10-CM | POA: Diagnosis not present

## 2017-12-01 DIAGNOSIS — M5430 Sciatica, unspecified side: Secondary | ICD-10-CM | POA: Diagnosis not present

## 2017-12-01 DIAGNOSIS — M9905 Segmental and somatic dysfunction of pelvic region: Secondary | ICD-10-CM | POA: Diagnosis not present

## 2017-12-03 DIAGNOSIS — M5430 Sciatica, unspecified side: Secondary | ICD-10-CM | POA: Diagnosis not present

## 2017-12-03 DIAGNOSIS — M9903 Segmental and somatic dysfunction of lumbar region: Secondary | ICD-10-CM | POA: Diagnosis not present

## 2017-12-03 DIAGNOSIS — M9904 Segmental and somatic dysfunction of sacral region: Secondary | ICD-10-CM | POA: Diagnosis not present

## 2017-12-03 DIAGNOSIS — M9905 Segmental and somatic dysfunction of pelvic region: Secondary | ICD-10-CM | POA: Diagnosis not present

## 2017-12-04 DIAGNOSIS — M5430 Sciatica, unspecified side: Secondary | ICD-10-CM | POA: Diagnosis not present

## 2017-12-04 DIAGNOSIS — M9905 Segmental and somatic dysfunction of pelvic region: Secondary | ICD-10-CM | POA: Diagnosis not present

## 2017-12-04 DIAGNOSIS — M9904 Segmental and somatic dysfunction of sacral region: Secondary | ICD-10-CM | POA: Diagnosis not present

## 2017-12-04 DIAGNOSIS — M9903 Segmental and somatic dysfunction of lumbar region: Secondary | ICD-10-CM | POA: Diagnosis not present

## 2017-12-08 DIAGNOSIS — M9904 Segmental and somatic dysfunction of sacral region: Secondary | ICD-10-CM | POA: Diagnosis not present

## 2017-12-08 DIAGNOSIS — M9903 Segmental and somatic dysfunction of lumbar region: Secondary | ICD-10-CM | POA: Diagnosis not present

## 2017-12-08 DIAGNOSIS — M9905 Segmental and somatic dysfunction of pelvic region: Secondary | ICD-10-CM | POA: Diagnosis not present

## 2017-12-08 DIAGNOSIS — M5430 Sciatica, unspecified side: Secondary | ICD-10-CM | POA: Diagnosis not present

## 2017-12-10 DIAGNOSIS — M9905 Segmental and somatic dysfunction of pelvic region: Secondary | ICD-10-CM | POA: Diagnosis not present

## 2017-12-10 DIAGNOSIS — M5430 Sciatica, unspecified side: Secondary | ICD-10-CM | POA: Diagnosis not present

## 2017-12-10 DIAGNOSIS — M9904 Segmental and somatic dysfunction of sacral region: Secondary | ICD-10-CM | POA: Diagnosis not present

## 2017-12-10 DIAGNOSIS — M9903 Segmental and somatic dysfunction of lumbar region: Secondary | ICD-10-CM | POA: Diagnosis not present

## 2017-12-11 DIAGNOSIS — M9904 Segmental and somatic dysfunction of sacral region: Secondary | ICD-10-CM | POA: Diagnosis not present

## 2017-12-11 DIAGNOSIS — M9903 Segmental and somatic dysfunction of lumbar region: Secondary | ICD-10-CM | POA: Diagnosis not present

## 2017-12-11 DIAGNOSIS — M5430 Sciatica, unspecified side: Secondary | ICD-10-CM | POA: Diagnosis not present

## 2017-12-11 DIAGNOSIS — M9905 Segmental and somatic dysfunction of pelvic region: Secondary | ICD-10-CM | POA: Diagnosis not present

## 2017-12-16 DIAGNOSIS — M9905 Segmental and somatic dysfunction of pelvic region: Secondary | ICD-10-CM | POA: Diagnosis not present

## 2017-12-16 DIAGNOSIS — M9903 Segmental and somatic dysfunction of lumbar region: Secondary | ICD-10-CM | POA: Diagnosis not present

## 2017-12-16 DIAGNOSIS — M9904 Segmental and somatic dysfunction of sacral region: Secondary | ICD-10-CM | POA: Diagnosis not present

## 2017-12-16 DIAGNOSIS — M5430 Sciatica, unspecified side: Secondary | ICD-10-CM | POA: Diagnosis not present

## 2017-12-18 DIAGNOSIS — M5430 Sciatica, unspecified side: Secondary | ICD-10-CM | POA: Diagnosis not present

## 2017-12-18 DIAGNOSIS — M9904 Segmental and somatic dysfunction of sacral region: Secondary | ICD-10-CM | POA: Diagnosis not present

## 2017-12-18 DIAGNOSIS — M9905 Segmental and somatic dysfunction of pelvic region: Secondary | ICD-10-CM | POA: Diagnosis not present

## 2017-12-18 DIAGNOSIS — M9903 Segmental and somatic dysfunction of lumbar region: Secondary | ICD-10-CM | POA: Diagnosis not present

## 2017-12-22 DIAGNOSIS — M9904 Segmental and somatic dysfunction of sacral region: Secondary | ICD-10-CM | POA: Diagnosis not present

## 2017-12-22 DIAGNOSIS — M5430 Sciatica, unspecified side: Secondary | ICD-10-CM | POA: Diagnosis not present

## 2017-12-22 DIAGNOSIS — M9905 Segmental and somatic dysfunction of pelvic region: Secondary | ICD-10-CM | POA: Diagnosis not present

## 2017-12-22 DIAGNOSIS — M9903 Segmental and somatic dysfunction of lumbar region: Secondary | ICD-10-CM | POA: Diagnosis not present

## 2017-12-25 DIAGNOSIS — M9905 Segmental and somatic dysfunction of pelvic region: Secondary | ICD-10-CM | POA: Diagnosis not present

## 2017-12-25 DIAGNOSIS — M9903 Segmental and somatic dysfunction of lumbar region: Secondary | ICD-10-CM | POA: Diagnosis not present

## 2017-12-25 DIAGNOSIS — M9904 Segmental and somatic dysfunction of sacral region: Secondary | ICD-10-CM | POA: Diagnosis not present

## 2017-12-25 DIAGNOSIS — M5430 Sciatica, unspecified side: Secondary | ICD-10-CM | POA: Diagnosis not present

## 2018-02-13 ENCOUNTER — Encounter: Payer: Self-pay | Admitting: Family Medicine

## 2018-03-11 ENCOUNTER — Other Ambulatory Visit: Payer: Self-pay | Admitting: Family Medicine

## 2018-05-01 ENCOUNTER — Ambulatory Visit: Payer: BLUE CROSS/BLUE SHIELD | Admitting: Family Medicine

## 2018-05-11 ENCOUNTER — Other Ambulatory Visit: Payer: Self-pay | Admitting: Family Medicine

## 2018-05-11 ENCOUNTER — Ambulatory Visit: Payer: BLUE CROSS/BLUE SHIELD | Admitting: Family Medicine

## 2018-05-11 DIAGNOSIS — I10 Essential (primary) hypertension: Secondary | ICD-10-CM

## 2018-05-11 NOTE — Telephone Encounter (Signed)
OV 05/18/18 

## 2018-05-18 ENCOUNTER — Encounter: Payer: BLUE CROSS/BLUE SHIELD | Admitting: Family Medicine

## 2018-05-18 ENCOUNTER — Encounter: Payer: Self-pay | Admitting: Family Medicine

## 2018-05-18 NOTE — Progress Notes (Signed)
   Subjective:  Patient ID: Gregg Morgan,  male    DOB: 08/27/1960  Age: 58 y.o.    CC: No chief complaint on file.   HPI Warn Kajiwara Perrell did not keep appt. History    ROS Review of Systems  Objective:  There were no vitals taken for this visit.  BP Readings from Last 3 Encounters:  10/28/17 126/76  10/15/16 124/82  07/15/16 135/88    Wt Readings from Last 3 Encounters:  10/28/17 186 lb 8 oz (84.6 kg)  10/15/16 192 lb (87.1 kg)  07/15/16 203 lb (92.1 kg)    Physical Exam

## 2018-05-20 ENCOUNTER — Encounter: Payer: Self-pay | Admitting: Family Medicine

## 2018-05-22 ENCOUNTER — Other Ambulatory Visit: Payer: Self-pay | Admitting: Family Medicine

## 2018-05-22 ENCOUNTER — Telehealth: Payer: Self-pay | Admitting: Family Medicine

## 2018-05-22 MED ORDER — OSELTAMIVIR PHOSPHATE 75 MG PO CAPS: 75.0000 mg | ORAL_CAPSULE | Freq: Every day | ORAL | 0 refills | Status: DC

## 2018-05-22 NOTE — Telephone Encounter (Signed)
I sent in the requested prescription 

## 2018-05-22 NOTE — Telephone Encounter (Signed)
Wife aware

## 2018-06-16 ENCOUNTER — Other Ambulatory Visit: Payer: Self-pay | Admitting: Family Medicine

## 2018-06-16 NOTE — Telephone Encounter (Signed)
Last lipid 10/28/17

## 2018-09-05 ENCOUNTER — Other Ambulatory Visit: Payer: Self-pay | Admitting: Family Medicine

## 2018-09-05 DIAGNOSIS — I10 Essential (primary) hypertension: Secondary | ICD-10-CM

## 2018-09-08 DIAGNOSIS — Z20828 Contact with and (suspected) exposure to other viral communicable diseases: Secondary | ICD-10-CM | POA: Diagnosis not present

## 2018-10-17 ENCOUNTER — Other Ambulatory Visit: Payer: Self-pay | Admitting: Family Medicine

## 2018-12-06 ENCOUNTER — Other Ambulatory Visit: Payer: Self-pay | Admitting: Family Medicine

## 2018-12-06 DIAGNOSIS — I10 Essential (primary) hypertension: Secondary | ICD-10-CM

## 2018-12-07 ENCOUNTER — Other Ambulatory Visit: Payer: Self-pay

## 2018-12-08 ENCOUNTER — Encounter: Payer: Self-pay | Admitting: Family Medicine

## 2018-12-08 ENCOUNTER — Ambulatory Visit: Payer: BLUE CROSS/BLUE SHIELD | Admitting: Family Medicine

## 2018-12-08 VITALS — BP 116/76 | HR 70 | Temp 98.9°F | Ht 66.5 in | Wt 195.0 lb

## 2018-12-08 DIAGNOSIS — E785 Hyperlipidemia, unspecified: Secondary | ICD-10-CM

## 2018-12-08 DIAGNOSIS — I10 Essential (primary) hypertension: Secondary | ICD-10-CM | POA: Diagnosis not present

## 2018-12-08 NOTE — Progress Notes (Signed)
Subjective:  Patient ID: Gregg Morgan, male    DOB: 03-Aug-1960  Age: 58 y.o. MRN: [id number]  CC: Medical Management of Chronic Issues, Hyperlipidemia, and Hypertension   HPI Gregg Morgan presents for  follow-up of hypertension. Patient has no history of headache chest pain or shortness of breath or recent cough. Patient also denies symptoms of TIA such as focal numbness or weakness. Patient denies side effects from medication. States taking it regularly.   in for follow-up of elevated cholesterol. Doing well without complaints on current medication. Denies side effects of statin including myalgia and arthralgia and nausea. Currently no chest pain, shortness of breath or other cardiovascular related symptoms noted.    History Gregg Morgan has a past medical history of Coronary artery disease, GERD (gastroesophageal reflux disease), Hyperlipidemia, Hypertension, and Obstructive sleep apnea.   Gregg Morgan has a past surgical history that includes none; Finger surgery; Cardiac catheterization; and ORIF finger fracture (08/03/2011).   His family history includes Colon cancer in his father; Hypertension in his mother; Thyroid disease in his mother.Gregg Morgan reports that Gregg Morgan quit smoking about 2 years ago. Gregg Morgan has a 10.00 pack-year smoking history. Gregg Morgan has never used smokeless tobacco. Gregg Morgan reports current alcohol use of about 2.0 standard drinks of alcohol per week. Gregg Morgan reports that Gregg Morgan does not use drugs.  Current Outpatient Medications on File Prior to Visit  Medication Sig Dispense Refill  . aspirin 81 MG tablet Take 81 mg by mouth at bedtime.     . Cholecalciferol (VITAMIN D) 2000 UNITS CAPS Take 1 capsule by mouth at bedtime.     . fish oil-omega-3 fatty acids 1000 MG capsule Take 1,000 mg by mouth at bedtime.     Marland Kitchen lisinopril (ZESTRIL) 20 MG tablet Take 1 tablet (20 mg total) by mouth daily. (Needs to be seen before next refill 30 tablet 0  . pantoprazole (PROTONIX) 40 MG tablet Take 1 tablet (40 mg total) by  mouth daily. 90 tablet 3  . rosuvastatin (CRESTOR) 20 MG tablet Take 1 tablet (20 mg total) by mouth daily. (Needs to be seen before next refill) 30 tablet 0   No current facility-administered medications on file prior to visit.     ROS Review of Systems  Constitutional: Negative.   HENT: Negative.   Eyes: Negative for visual disturbance.  Respiratory: Negative for cough and shortness of breath.   Cardiovascular: Negative for chest pain and leg swelling.  Gastrointestinal: Negative for abdominal pain, diarrhea, nausea and vomiting.  Genitourinary: Negative for difficulty urinating.  Musculoskeletal: Negative for arthralgias and myalgias.  Skin: Negative for rash.  Neurological: Negative for headaches.  Psychiatric/Behavioral: Negative for sleep disturbance.    Objective:  BP 116/76   Pulse 70   Temp 98.9 F (37.2 C)   Ht 5' 6.5" (1.689 m)   Wt 195 lb (88.5 kg)   BMI 31.00 kg/m   BP Readings from Last 3 Encounters:  12/08/18 116/76  10/28/17 126/76  10/15/16 124/82    Wt Readings from Last 3 Encounters:  12/08/18 195 lb (88.5 kg)  10/28/17 186 lb 8 oz (84.6 kg)  10/15/16 192 lb (87.1 kg)     Physical Exam Constitutional:      General: Gregg Morgan is not in acute distress.    Appearance: Gregg Morgan is well-developed.  HENT:     Head: Normocephalic and atraumatic.     Right Ear: External ear normal.     Left Ear: External ear normal.     Nose: Nose normal.  Eyes:     Conjunctiva/sclera: Conjunctivae normal.     Pupils: Pupils are equal, round, and reactive to light.  Neck:     Musculoskeletal: Normal range of motion and neck supple.  Cardiovascular:     Rate and Rhythm: Normal rate and regular rhythm.     Heart sounds: Normal heart sounds. No murmur.  Pulmonary:     Effort: Pulmonary effort is normal. No respiratory distress.     Breath sounds: Normal breath sounds. No wheezing or rales.  Abdominal:     Palpations: Abdomen is soft.     Tenderness: There is no abdominal  tenderness.  Musculoskeletal: Normal range of motion.  Skin:    General: Skin is warm and dry.  Neurological:     Mental Status: Gregg Morgan is alert and oriented to person, place, and time.     Deep Tendon Reflexes: Reflexes are normal and symmetric.  Psychiatric:        Behavior: Behavior normal.        Thought Content: Thought content normal.        Judgment: Judgment normal.       Assessment & Plan:   Gregg Morgan was seen today for medical management of chronic issues, hyperlipidemia and hypertension.  Diagnoses and all orders for this visit:  Essential hypertension -     CBC with Differential/Platelet -     CMP14+EGFR  Hyperlipidemia, unspecified hyperlipidemia type -     CBC with Differential/Platelet -     Lipid panel   Allergies as of 12/08/2018   No Known Allergies     Medication List       Accurate as of December 08, 2018  8:10 PM. If you have any questions, ask your nurse or doctor.        STOP taking these medications   Boostrix 5-2.5-18.5 LF-MCG/0.5 injection Generic drug: Tdap Stopped by: Claretta Fraise, MD   fexofenadine 180 MG tablet Commonly known as: ALLEGRA Stopped by: Claretta Fraise, MD   oseltamivir 75 MG capsule Commonly known as: Tamiflu Stopped by: Claretta Fraise, MD   Vitamin D (Ergocalciferol) 1.25 MG (50000 UT) Caps capsule Commonly known as: DRISDOL Stopped by: Claretta Fraise, MD     TAKE these medications   aspirin 81 MG tablet Take 81 mg by mouth at bedtime.   fish oil-omega-3 fatty acids 1000 MG capsule Take 1,000 mg by mouth at bedtime.   lisinopril 20 MG tablet Commonly known as: ZESTRIL Take 1 tablet (20 mg total) by mouth daily. (Needs to be seen before next refill   pantoprazole 40 MG tablet Commonly known as: PROTONIX Take 1 tablet (40 mg total) by mouth daily.   rosuvastatin 20 MG tablet Commonly known as: CRESTOR Take 1 tablet (20 mg total) by mouth daily. (Needs to be seen before next refill)   Vitamin D 50 MCG (2000 UT)  Caps Take 1 capsule by mouth at bedtime.         Follow-up: Return in about 6 months (around 06/10/2019).  Claretta Fraise, M.D.

## 2018-12-09 ENCOUNTER — Other Ambulatory Visit: Payer: Self-pay | Admitting: *Deleted

## 2018-12-09 DIAGNOSIS — I10 Essential (primary) hypertension: Secondary | ICD-10-CM

## 2018-12-09 LAB — CBC WITH DIFFERENTIAL/PLATELET
Basophils Absolute: 0.1 10*3/uL (ref 0.0–0.2)
Basos: 1 %
EOS (ABSOLUTE): 0.1 10*3/uL (ref 0.0–0.4)
Eos: 1 %
Hematocrit: 44.5 % (ref 37.5–51.0)
Hemoglobin: 15.5 g/dL (ref 13.0–17.7)
Immature Grans (Abs): 0 10*3/uL (ref 0.0–0.1)
Immature Granulocytes: 0 %
Lymphocytes Absolute: 1.6 10*3/uL (ref 0.7–3.1)
Lymphs: 27 %
MCH: 31.4 pg (ref 26.6–33.0)
MCHC: 34.8 g/dL (ref 31.5–35.7)
MCV: 90 fL (ref 79–97)
Monocytes Absolute: 0.6 10*3/uL (ref 0.1–0.9)
Monocytes: 9 %
Neutrophils Absolute: 3.8 10*3/uL (ref 1.4–7.0)
Neutrophils: 62 %
Platelets: 252 10*3/uL (ref 150–450)
RBC: 4.93 x10E6/uL (ref 4.14–5.80)
RDW: 12.8 % (ref 11.6–15.4)
WBC: 6.1 10*3/uL (ref 3.4–10.8)

## 2018-12-09 LAB — CMP14+EGFR
ALT: 34 IU/L (ref 0–44)
AST: 24 IU/L (ref 0–40)
Albumin/Globulin Ratio: 2.4 — ABNORMAL HIGH (ref 1.2–2.2)
Albumin: 5 g/dL — ABNORMAL HIGH (ref 3.8–4.9)
Alkaline Phosphatase: 82 IU/L (ref 39–117)
BUN/Creatinine Ratio: 12 (ref 9–20)
BUN: 10 mg/dL (ref 6–24)
Bilirubin Total: 0.4 mg/dL (ref 0.0–1.2)
CO2: 21 mmol/L (ref 20–29)
Calcium: 9.9 mg/dL (ref 8.7–10.2)
Chloride: 102 mmol/L (ref 96–106)
Creatinine, Ser: 0.85 mg/dL (ref 0.76–1.27)
GFR calc Af Amer: 111 mL/min/{1.73_m2} (ref >59)
GFR calc non Af Amer: 96 mL/min/{1.73_m2} (ref >59)
Globulin, Total: 2.1 g/dL (ref 1.5–4.5)
Glucose: 79 mg/dL (ref 65–99)
Potassium: 4.6 mmol/L (ref 3.5–5.2)
Sodium: 141 mmol/L (ref 134–144)
Total Protein: 7.1 g/dL (ref 6.0–8.5)

## 2018-12-09 LAB — LIPID PANEL
Chol/HDL Ratio: 3.8 ratio (ref 0.0–5.0)
Cholesterol, Total: 205 mg/dL — ABNORMAL HIGH (ref 100–199)
HDL: 54 mg/dL (ref >39)
LDL Calculated: 118 mg/dL — ABNORMAL HIGH (ref 0–99)
Triglycerides: 164 mg/dL — ABNORMAL HIGH (ref 0–149)
VLDL Cholesterol Cal: 33 mg/dL (ref 5–40)

## 2018-12-09 MED ORDER — PANTOPRAZOLE SODIUM 40 MG PO TBEC: 40.0000 mg | DELAYED_RELEASE_TABLET | Freq: Every day | ORAL | 1 refills | Status: DC

## 2018-12-09 MED ORDER — LISINOPRIL 20 MG PO TABS: 20.0000 mg | ORAL_TABLET | Freq: Every day | ORAL | 1 refills | Status: DC

## 2018-12-09 MED ORDER — ROSUVASTATIN CALCIUM 20 MG PO TABS: 20.0000 mg | ORAL_TABLET | Freq: Every day | ORAL | 1 refills | Status: DC

## 2019-05-11 ENCOUNTER — Other Ambulatory Visit: Payer: Self-pay | Admitting: Family Medicine

## 2019-06-10 ENCOUNTER — Ambulatory Visit: Payer: BC Managed Care – PPO | Admitting: Family Medicine

## 2019-06-15 ENCOUNTER — Other Ambulatory Visit: Payer: Self-pay | Admitting: Family Medicine

## 2019-06-15 DIAGNOSIS — I10 Essential (primary) hypertension: Secondary | ICD-10-CM

## 2019-06-27 ENCOUNTER — Other Ambulatory Visit: Payer: Self-pay | Admitting: Family Medicine

## 2019-06-27 DIAGNOSIS — I10 Essential (primary) hypertension: Secondary | ICD-10-CM

## 2019-07-24 ENCOUNTER — Other Ambulatory Visit: Payer: Self-pay | Admitting: Family Medicine

## 2019-07-24 DIAGNOSIS — I10 Essential (primary) hypertension: Secondary | ICD-10-CM

## 2019-08-04 ENCOUNTER — Other Ambulatory Visit: Payer: Self-pay

## 2019-08-04 ENCOUNTER — Encounter: Payer: Self-pay | Admitting: Family Medicine

## 2019-08-04 ENCOUNTER — Ambulatory Visit (INDEPENDENT_AMBULATORY_CARE_PROVIDER_SITE_OTHER): Payer: BC Managed Care – PPO | Admitting: Family Medicine

## 2019-08-04 DIAGNOSIS — Z207 Contact with and (suspected) exposure to pediculosis, acariasis and other infestations: Secondary | ICD-10-CM

## 2019-08-04 DIAGNOSIS — Z2089 Contact with and (suspected) exposure to other communicable diseases: Secondary | ICD-10-CM | POA: Diagnosis not present

## 2019-08-04 MED ORDER — PERMETHRIN 5 % EX CREA: 1.0000 "application " | TOPICAL_CREAM | Freq: Once | CUTANEOUS | 0 refills | Status: AC

## 2019-08-04 NOTE — Progress Notes (Signed)
    Subjective:    Patient ID: Gregg Morgan, male    DOB: Sep 13, 1960, 59 y.o.   MRN: [id number]   HPI: Gregg Morgan is a 59 y.o. male presenting for scabies. Wife got them from a client. She is being treated and her provider wanted him to get treated too because of the likelihood of spreading it back and forth through close contact.    Depression screen [facility] 2/9 12/08/2018 10/28/2017 07/15/2016 06/04/2016 04/24/2016  Decreased Interest 0 0 0 0 0  Down, Depressed, Hopeless 0 0 0 0 0  PHQ - 2 Score 0 0 0 0 0     Relevant past medical, surgical, family and social history reviewed and updated as indicated.  Interim medical history since our last visit reviewed. Allergies and medications reviewed and updated.  ROS:  Review of Systems  Constitutional: Negative for fever.  Skin: Negative for rash.       No itch      Social History   Tobacco Use  Smoking Status Former Smoker  . Packs/day: 0.50  . Years: 20.00  . Pack years: 10.00  . Quit date: 03/12/2016  . Years since quitting: 3.3  Smokeless Tobacco Never Used       Objective:     Wt Readings from Last 3 Encounters:  12/08/18 195 lb (88.5 kg)  10/28/17 186 lb 8 oz (84.6 kg)  10/15/16 192 lb (87.1 kg)     Exam deferred. Pt. Harboring due to COVID 19. Phone visit performed.   Assessment & Plan:   1. Scabies exposure     Meds ordered this encounter  Medications  . permethrin (ELIMITE) 5 % cream    Sig: Apply 1 application topically once for 1 dose.    Dispense:  60 g    Refill:  0    No orders of the defined types were placed in this encounter.     Diagnoses and all orders for this visit:  Scabies exposure  Other orders -     permethrin (ELIMITE) 5 % cream; Apply 1 application topically once for 1 dose.    Virtual Visit via telephone Note  I discussed the limitations, risks, security and privacy concerns of performing an evaluation and management service by telephone and the availability of in  person appointments. The patient was identified with two identifiers. Pt.expressed understanding and agreed to proceed. Pt. Is at home. Dr. Darlyn Read is in his office.  Follow Up Instructions:   I discussed the assessment and treatment plan with the patient. The patient was provided an opportunity to ask questions and all were answered. The patient agreed with the plan and demonstrated an understanding of the instructions.   The patient was advised to call back or seek an in-person evaluation if the symptoms worsen or if the condition fails to improve as anticipated.   Total minutes including chart review and phone contact time: 9   Follow up plan: Return if symptoms worsen or fail to improve.  Mechele Claude, MD [facility]

## 2019-09-15 ENCOUNTER — Other Ambulatory Visit: Payer: Self-pay | Admitting: Family Medicine

## 2019-09-15 DIAGNOSIS — I10 Essential (primary) hypertension: Secondary | ICD-10-CM

## 2019-09-20 ENCOUNTER — Telehealth: Payer: Self-pay | Admitting: Family Medicine

## 2019-09-20 ENCOUNTER — Other Ambulatory Visit: Payer: Self-pay

## 2019-09-20 MED ORDER — ROSUVASTATIN CALCIUM 20 MG PO TABS: 20.0000 mg | ORAL_TABLET | Freq: Every day | ORAL | 0 refills | Status: DC

## 2019-09-20 NOTE — Telephone Encounter (Signed)
  Prescription Request  09/20/2019  What is the name of the medication or equipment? rosuvastatin (CRESTOR) 20 MG tablet  Have you contacted your pharmacy to request a refill? (if applicable) no--spouse called for refill I told her ntbs--she asked to send [employer] a message first. She did schedule him an apt for 10/12/2019 for CPE  Which pharmacy would you like this sent to? [facility]   Patient notified that their request is being sent to the clinical staff for review and that they should receive a response within 2 business days.

## 2019-09-20 NOTE — Telephone Encounter (Signed)
Pt was last seen in the office by Dr. Darlyn Read August of 2020. Pt should have returned in 6 months. Pt hs spoken with Dr. Darlyn Read since but has had no OV. He does have an upcoming appt scheduled for 10/12/2019. [facility] to [facility] [place]. No further refills will be given.

## 2019-10-11 ENCOUNTER — Other Ambulatory Visit: Payer: Self-pay | Admitting: Family Medicine

## 2019-10-11 DIAGNOSIS — I10 Essential (primary) hypertension: Secondary | ICD-10-CM

## 2019-10-12 ENCOUNTER — Other Ambulatory Visit: Payer: Self-pay | Admitting: Family Medicine

## 2019-10-12 ENCOUNTER — Ambulatory Visit: Payer: BC Managed Care – PPO | Admitting: Family Medicine

## 2019-10-12 ENCOUNTER — Other Ambulatory Visit: Payer: Self-pay

## 2019-10-12 ENCOUNTER — Encounter: Payer: Self-pay | Admitting: Family Medicine

## 2019-10-12 VITALS — BP 124/79 | HR 65 | Temp 97.8°F | Ht 66.5 in | Wt 201.0 lb

## 2019-10-12 DIAGNOSIS — Z125 Encounter for screening for malignant neoplasm of prostate: Secondary | ICD-10-CM | POA: Diagnosis not present

## 2019-10-12 DIAGNOSIS — E559 Vitamin D deficiency, unspecified: Secondary | ICD-10-CM

## 2019-10-12 DIAGNOSIS — K219 Gastro-esophageal reflux disease without esophagitis: Secondary | ICD-10-CM | POA: Diagnosis not present

## 2019-10-12 DIAGNOSIS — Z0001 Encounter for general adult medical examination with abnormal findings: Secondary | ICD-10-CM

## 2019-10-12 DIAGNOSIS — I25119 Atherosclerotic heart disease of native coronary artery with unspecified angina pectoris: Secondary | ICD-10-CM

## 2019-10-12 DIAGNOSIS — I10 Essential (primary) hypertension: Secondary | ICD-10-CM

## 2019-10-12 DIAGNOSIS — Z1211 Encounter for screening for malignant neoplasm of colon: Secondary | ICD-10-CM | POA: Diagnosis not present

## 2019-10-12 DIAGNOSIS — E782 Mixed hyperlipidemia: Secondary | ICD-10-CM

## 2019-10-12 DIAGNOSIS — Z800 Family history of malignant neoplasm of digestive organs: Secondary | ICD-10-CM

## 2019-10-12 DIAGNOSIS — Z0000 Encounter for general adult medical examination without abnormal findings: Secondary | ICD-10-CM

## 2019-10-12 LAB — URINALYSIS
Bilirubin, UA: NEGATIVE
Glucose, UA: NEGATIVE
Ketones, UA: NEGATIVE
Leukocytes,UA: NEGATIVE
Nitrite, UA: NEGATIVE
Protein,UA: NEGATIVE
Specific Gravity, UA: 1.02 (ref 1.005–1.030)
Urobilinogen, Ur: 0.2 mg/dL (ref 0.2–1.0)
pH, UA: 7 (ref 5.0–7.5)

## 2019-10-12 NOTE — Progress Notes (Signed)
Subjective:  Patient ID: Gregg Morgan, male    DOB: 1960-07-23  Age: 59 y.o. MRN: [id number]  CC: Annual Exam   HPI Gregg Morgan presents for complete physical examination.  He is due for colonoscopy.  His father had colon cancer.  Patient in for follow-up of elevated cholesterol. Doing well without complaints on current medication. Denies side effects of statin including myalgia and arthralgia and nausea. Also in today for liver function testing. Currently no chest pain, shortness of breath or other cardiovascular related symptoms noted.  Patient in for follow-up of GERD. Currently asymptomatic taking  PPI daily. There is no chest pain or heartburn. No hematemesis and no melena. No dysphagia or choking. Onset is remote. Progression is stable. Complicating factors, none.    Follow-up of hypertension. Patient has no history of headache chest pain or shortness of breath or recent cough. Patient also denies symptoms of TIA such as numbness weakness lateralizing. Patient checks  blood pressure at home and has not had any elevated readings recently. Patient denies side effects from his medication. States taking it regularly.   Depression screen [facility] 2/9 10/12/2019 12/08/2018 10/28/2017  Decreased Interest 0 0 0  Down, Depressed, Hopeless 0 0 0  PHQ - 2 Score 0 0 0    History Gregg Morgan has a past medical history of Coronary artery disease, GERD (gastroesophageal reflux disease), Hyperlipidemia, Hypertension, and Obstructive sleep apnea.   He has a past surgical history that includes none; Finger surgery; Cardiac catheterization; and ORIF finger fracture (08/03/2011).   His family history includes Colon cancer in his father; Hypertension in his mother; Thyroid disease in his mother.He reports that he quit smoking about 3 years ago. He has a 10.00 pack-year smoking history. He has never used smokeless tobacco. He reports current alcohol use of about 2.0 standard drinks of alcohol per week. He  reports that he does not use drugs.    ROS Review of Systems  Constitutional: Negative for activity change, fatigue and unexpected weight change.  HENT: Negative for congestion, ear pain, hearing loss, postnasal drip and trouble swallowing.   Eyes: Negative for pain and visual disturbance.  Respiratory: Negative for cough, chest tightness and shortness of breath.   Cardiovascular: Negative for chest pain, palpitations and leg swelling.  Gastrointestinal: Negative for abdominal distention, abdominal pain, blood in stool, constipation, diarrhea, nausea and vomiting.  Endocrine: Negative for cold intolerance, heat intolerance and polydipsia.  Genitourinary: Negative for difficulty urinating, dysuria, flank pain, frequency and urgency.  Musculoskeletal: Negative for arthralgias and joint swelling.  Skin: Negative for color change, rash and wound.  Neurological: Negative for dizziness, syncope, speech difficulty, weakness, light-headedness, numbness and headaches.  Hematological: Does not bruise/bleed easily.  Psychiatric/Behavioral: Negative for confusion, decreased concentration, dysphoric mood and sleep disturbance. The patient is not nervous/anxious.     Objective:  BP 124/79   Pulse 65   Temp 97.8 F (36.6 C) (Temporal)   Ht 5' 6.5" (1.689 m)   Wt 201 lb (91.2 kg)   BMI 31.96 kg/m   BP Readings from Last 3 Encounters:  10/12/19 124/79  12/08/18 116/76  10/28/17 126/76    Wt Readings from Last 3 Encounters:  10/12/19 201 lb (91.2 kg)  12/08/18 195 lb (88.5 kg)  10/28/17 186 lb 8 oz (84.6 kg)     Physical Exam Constitutional:      Appearance: He is well-developed.  HENT:     Head: Normocephalic and atraumatic.  Eyes:     Pupils:  Pupils are equal, round, and reactive to light.  Neck:     Thyroid: No thyromegaly.     Trachea: No tracheal deviation.  Cardiovascular:     Rate and Rhythm: Normal rate and regular rhythm.     Heart sounds: Normal heart sounds. No murmur  heard.  No friction rub. No gallop.   Pulmonary:     Breath sounds: Normal breath sounds. No wheezing or rales.  Abdominal:     General: Bowel sounds are normal. There is no distension.     Palpations: Abdomen is soft. There is no mass.     Tenderness: There is no abdominal tenderness.     Hernia: There is no hernia in the left inguinal area.  Genitourinary:    Penis: Normal.      Testes: Normal.  Musculoskeletal:        General: Normal range of motion.     Cervical back: Normal range of motion.  Lymphadenopathy:     Cervical: No cervical adenopathy.  Skin:    General: Skin is warm and dry.  Neurological:     Mental Status: He is alert and oriented to person, place, and time.       Assessment & Plan:   Gregg Morgan was seen today for annual exam.  Diagnoses and all orders for this visit:  Well adult exam -     CBC with Differential/Platelet -     CMP14+EGFR  Screening for colon cancer -     Ambulatory referral to Gastroenterology  Family history of colon cancer -     Ambulatory referral to Gastroenterology  Mixed hyperlipidemia -     CBC with Differential/Platelet -     CMP14+EGFR -     Lipid panel  Essential hypertension -     CBC with Differential/Platelet -     CMP14+EGFR -     Urinalysis  Coronary artery disease involving native coronary artery of native heart with angina pectoris ([place]) -     CBC with Differential/Platelet -     CMP14+EGFR  Gastroesophageal reflux disease without esophagitis -     CBC with Differential/Platelet -     CMP14+EGFR  Screening for prostate cancer -     PSA Total (Reflex To Free)  Vitamin D deficiency -     VITAMIN D 25 Hydroxy (Vit-D Deficiency, Fractures)       I am having Gregg Morgan maintain his aspirin, fish oil-omega-3 fatty acids, Vitamin D, pantoprazole, rosuvastatin, and lisinopril.  Allergies as of 10/12/2019   No Known Allergies     Medication List       Accurate as of October 12, 2019  3:24 PM. If  you have any questions, ask your nurse or doctor.        aspirin 81 MG tablet Take 81 mg by mouth at bedtime.   fish oil-omega-3 fatty acids 1000 MG capsule Take 1,000 mg by mouth at bedtime.   lisinopril 20 MG tablet Commonly known as: ZESTRIL TAKE 1 TABLET (20 MG TOTAL) BY MOUTH DAILY. (NEEDS TO BE SEEN BEFORE NEXT REFILL)   pantoprazole 40 MG tablet Commonly known as: PROTONIX TAKE 1 TABLET BY MOUTH EVERY DAY   rosuvastatin 20 MG tablet Commonly known as: CRESTOR Take 1 tablet (20 mg total) by mouth daily. (Needs to be seen before next refill)   Vitamin D 50 MCG (2000 UT) Caps Take 1 capsule by mouth at bedtime.      Weight loss 25 to 30 pounds recommended.  Follow-up: Return in about 6 months (around 04/12/2020).  Claretta Fraise, M.D.

## 2019-10-12 NOTE — Telephone Encounter (Signed)
Has appt with you today

## 2019-10-13 LAB — CBC WITH DIFFERENTIAL/PLATELET
Basophils Absolute: 0.1 10*3/uL (ref 0.0–0.2)
Basos: 2 %
EOS (ABSOLUTE): 0.1 10*3/uL (ref 0.0–0.4)
Eos: 2 %
Hematocrit: 39.3 % (ref 37.5–51.0)
Hemoglobin: 13.7 g/dL (ref 13.0–17.7)
Immature Grans (Abs): 0 10*3/uL (ref 0.0–0.1)
Immature Granulocytes: 1 %
Lymphocytes Absolute: 1.8 10*3/uL (ref 0.7–3.1)
Lymphs: 29 %
MCH: 31.7 pg (ref 26.6–33.0)
MCHC: 34.9 g/dL (ref 31.5–35.7)
MCV: 91 fL (ref 79–97)
Monocytes Absolute: 0.6 10*3/uL (ref 0.1–0.9)
Monocytes: 9 %
Neutrophils Absolute: 3.5 10*3/uL (ref 1.4–7.0)
Neutrophils: 57 %
Platelets: 272 10*3/uL (ref 150–450)
RBC: 4.32 x10E6/uL (ref 4.14–5.80)
RDW: 12.3 % (ref 11.6–15.4)
WBC: 6.1 10*3/uL (ref 3.4–10.8)

## 2019-10-13 LAB — LIPID PANEL
Chol/HDL Ratio: 3.8 ratio (ref 0.0–5.0)
Cholesterol, Total: 185 mg/dL (ref 100–199)
HDL: 49 mg/dL (ref >39)
LDL Chol Calc (NIH): 111 mg/dL — ABNORMAL HIGH (ref 0–99)
Triglycerides: 139 mg/dL (ref 0–149)
VLDL Cholesterol Cal: 25 mg/dL (ref 5–40)

## 2019-10-13 LAB — CMP14+EGFR
ALT: 26 IU/L (ref 0–44)
AST: 19 IU/L (ref 0–40)
Albumin/Globulin Ratio: 2 (ref 1.2–2.2)
Albumin: 4.7 g/dL (ref 3.8–4.9)
Alkaline Phosphatase: 107 IU/L (ref 48–121)
BUN/Creatinine Ratio: 13 (ref 9–20)
BUN: 11 mg/dL (ref 6–24)
Bilirubin Total: 0.3 mg/dL (ref 0.0–1.2)
CO2: 24 mmol/L (ref 20–29)
Calcium: 9.7 mg/dL (ref 8.7–10.2)
Chloride: 100 mmol/L (ref 96–106)
Creatinine, Ser: 0.88 mg/dL (ref 0.76–1.27)
GFR calc Af Amer: 109 mL/min/{1.73_m2} (ref >59)
GFR calc non Af Amer: 94 mL/min/{1.73_m2} (ref >59)
Globulin, Total: 2.4 g/dL (ref 1.5–4.5)
Glucose: 89 mg/dL (ref 65–99)
Potassium: 4.3 mmol/L (ref 3.5–5.2)
Sodium: 137 mmol/L (ref 134–144)
Total Protein: 7.1 g/dL (ref 6.0–8.5)

## 2019-10-13 LAB — FPSA% REFLEX
% FREE PSA: 7.6 %
PSA, FREE: 0.42 ng/mL

## 2019-10-13 LAB — PSA TOTAL (REFLEX TO FREE): Prostate Specific Ag, Serum: 5.5 ng/mL — ABNORMAL HIGH (ref 0.0–4.0)

## 2019-10-13 LAB — VITAMIN D 25 HYDROXY (VIT D DEFICIENCY, FRACTURES): Vit D, 25-Hydroxy: 26 ng/mL — ABNORMAL LOW (ref 30.0–100.0)

## 2019-11-05 ENCOUNTER — Other Ambulatory Visit: Payer: Self-pay | Admitting: Family Medicine

## 2019-11-05 DIAGNOSIS — I10 Essential (primary) hypertension: Secondary | ICD-10-CM

## 2019-11-12 ENCOUNTER — Encounter: Payer: Self-pay | Admitting: Internal Medicine

## 2019-11-29 ENCOUNTER — Telehealth: Payer: Self-pay | Admitting: Family Medicine

## 2019-11-29 NOTE — Telephone Encounter (Signed)
Pts wife states that before he gets the covid vaccine he has been advised to consult with his PCP on which one he should get. Requesting call back for advice.

## 2019-11-29 NOTE — Telephone Encounter (Signed)
Patient's wife Marcelino Duster) aware that patient should receive moderna vaccine per Dr. Darlyn Read

## 2019-11-29 NOTE — Telephone Encounter (Signed)
He should get the Moderna vaccine.

## 2019-12-09 DIAGNOSIS — Z23 Encounter for immunization: Secondary | ICD-10-CM | POA: Diagnosis not present

## 2019-12-14 ENCOUNTER — Telehealth: Payer: Self-pay

## 2019-12-14 NOTE — Telephone Encounter (Signed)
Pt was NS for PV.  Message left for him to call back by 5 pm today to avoid cancellation of procedure.

## 2019-12-14 NOTE — Telephone Encounter (Signed)
No return call received by 5:00 PM.  No Show letter sent with procedure cancelled.

## 2019-12-30 ENCOUNTER — Encounter: Payer: BLUE CROSS/BLUE SHIELD | Admitting: Internal Medicine

## 2020-01-06 DIAGNOSIS — Z23 Encounter for immunization: Secondary | ICD-10-CM | POA: Diagnosis not present

## 2020-01-30 ENCOUNTER — Other Ambulatory Visit: Payer: Self-pay | Admitting: Family Medicine

## 2020-04-12 ENCOUNTER — Ambulatory Visit: Payer: BC Managed Care – PPO | Admitting: Family Medicine

## 2020-05-02 ENCOUNTER — Encounter: Payer: BC Managed Care – PPO | Admitting: Family Medicine

## 2020-05-02 ENCOUNTER — Encounter: Payer: Self-pay | Admitting: Family Medicine

## 2020-05-02 NOTE — Progress Notes (Signed)
Erroneous. Pt. Wants live visit. He will call to reschedule. WS

## 2020-05-02 NOTE — Progress Notes (Deleted)
Patient reports that he wanted to do a live visit.  He will reschedule with the office. WS   Subjective:  Patient ID: Cola Highfill, male    DOB: 02-09-1961  Age: 60 y.o. MRN: [id number]  CC: No chief complaint on file.   HPI Stephenson Cichy Herbster presents for ***  Depression screen [facility] 2/9 10/12/2019 12/08/2018 10/28/2017  Decreased Interest 0 0 0  Down, Depressed, Hopeless 0 0 0  PHQ - 2 Score 0 0 0    History Tycen has a past medical history of Coronary artery disease, GERD (gastroesophageal reflux disease), Hyperlipidemia, Hypertension, and Obstructive sleep apnea.   He has a past surgical history that includes none; Finger surgery; Cardiac catheterization; and ORIF finger fracture (08/03/2011).   His family history includes Colon cancer in his father; Hypertension in his mother; Thyroid disease in his mother.He reports that he quit smoking about 4 years ago. He has a 10.00 pack-year smoking history. He has never used smokeless tobacco. He reports current alcohol use of about 2.0 standard drinks of alcohol per week. He reports that he does not use drugs.    ROS Review of Systems  Objective:  There were no vitals taken for this visit.  BP Readings from Last 3 Encounters:  10/12/19 124/79  12/08/18 116/76  10/28/17 126/76    Wt Readings from Last 3 Encounters:  10/12/19 201 lb (91.2 kg)  12/08/18 195 lb (88.5 kg)  10/28/17 186 lb 8 oz (84.6 kg)     Physical Exam    Assessment & Plan:   Diagnoses and all orders for this visit:  Erroneous encounter - disregard       I am having Kalman Shan Signer maintain his aspirin, fish oil-omega-3 fatty acids, Vitamin D, pantoprazole, lisinopril, and rosuvastatin.  Allergies as of 05/02/2020   No Known Allergies     Medication List       Accurate as of May 02, 2020  4:03 PM. If you have any questions, ask your nurse or doctor.        aspirin 81 MG tablet Take 81 mg by mouth at bedtime.   fish  oil-omega-3 fatty acids 1000 MG capsule Take 1,000 mg by mouth at bedtime.   lisinopril 20 MG tablet Commonly known as: ZESTRIL Take 1 tablet (20 mg total) by mouth daily.   pantoprazole 40 MG tablet Commonly known as: PROTONIX TAKE 1 TABLET BY MOUTH EVERY DAY   rosuvastatin 20 MG tablet Commonly known as: CRESTOR TAKE 1 TABLET BY MOUTH EVERY DAY   Vitamin D 50 MCG (2000 UT) Caps Take 1 capsule by mouth at bedtime.        Follow-up: Return in about 2 weeks (around 05/16/2020).  Mechele Claude, M.D.

## 2020-05-08 ENCOUNTER — Other Ambulatory Visit: Payer: Self-pay | Admitting: Family Medicine

## 2020-05-08 DIAGNOSIS — I10 Essential (primary) hypertension: Secondary | ICD-10-CM

## 2020-05-27 ENCOUNTER — Other Ambulatory Visit: Payer: Self-pay | Admitting: Family Medicine

## 2020-06-04 ENCOUNTER — Other Ambulatory Visit: Payer: Self-pay | Admitting: Family Medicine

## 2020-06-04 DIAGNOSIS — I10 Essential (primary) hypertension: Secondary | ICD-10-CM

## 2020-06-05 NOTE — Telephone Encounter (Signed)
Stacks. NTBS 30 days given 05/08/20

## 2020-06-06 MED ORDER — LISINOPRIL 20 MG PO TABS: 20.0000 mg | ORAL_TABLET | Freq: Every day | ORAL | 0 refills | Status: DC

## 2020-06-06 NOTE — Addendum Note (Signed)
Addended by: Magdalene River on: 06/06/2020 11:36 AM   Modules accepted: Orders

## 2020-06-22 ENCOUNTER — Encounter: Payer: Self-pay | Admitting: Family Medicine

## 2020-06-22 ENCOUNTER — Other Ambulatory Visit: Payer: Self-pay | Admitting: Family Medicine

## 2020-06-22 ENCOUNTER — Ambulatory Visit (INDEPENDENT_AMBULATORY_CARE_PROVIDER_SITE_OTHER): Payer: BC Managed Care – PPO

## 2020-06-22 ENCOUNTER — Other Ambulatory Visit: Payer: Self-pay

## 2020-06-22 ENCOUNTER — Ambulatory Visit: Payer: BC Managed Care – PPO | Admitting: Family Medicine

## 2020-06-22 VITALS — BP 139/91 | HR 66 | Temp 97.0°F | Ht 66.5 in | Wt 204.0 lb

## 2020-06-22 DIAGNOSIS — Z23 Encounter for immunization: Secondary | ICD-10-CM

## 2020-06-22 DIAGNOSIS — E782 Mixed hyperlipidemia: Secondary | ICD-10-CM | POA: Diagnosis not present

## 2020-06-22 DIAGNOSIS — Z1211 Encounter for screening for malignant neoplasm of colon: Secondary | ICD-10-CM | POA: Diagnosis not present

## 2020-06-22 DIAGNOSIS — M25561 Pain in right knee: Secondary | ICD-10-CM

## 2020-06-22 DIAGNOSIS — I10 Essential (primary) hypertension: Secondary | ICD-10-CM

## 2020-06-22 DIAGNOSIS — M25761 Osteophyte, right knee: Secondary | ICD-10-CM | POA: Diagnosis not present

## 2020-06-22 LAB — CMP14+EGFR
ALT: 27 IU/L (ref 0–44)
AST: 21 IU/L (ref 0–40)
Albumin/Globulin Ratio: 1.7 (ref 1.2–2.2)
Albumin: 4.7 g/dL (ref 3.8–4.9)
Alkaline Phosphatase: 106 IU/L (ref 44–121)
BUN/Creatinine Ratio: 15 (ref 10–24)
BUN: 14 mg/dL (ref 8–27)
Bilirubin Total: 0.5 mg/dL (ref 0.0–1.2)
CO2: 19 mmol/L — ABNORMAL LOW (ref 20–29)
Calcium: 9.9 mg/dL (ref 8.6–10.2)
Chloride: 101 mmol/L (ref 96–106)
Creatinine, Ser: 0.93 mg/dL (ref 0.76–1.27)
Globulin, Total: 2.7 g/dL (ref 1.5–4.5)
Glucose: 108 mg/dL — ABNORMAL HIGH (ref 65–99)
Potassium: 4.7 mmol/L (ref 3.5–5.2)
Sodium: 139 mmol/L (ref 134–144)
Total Protein: 7.4 g/dL (ref 6.0–8.5)
eGFR: 94 mL/min/{1.73_m2} (ref >59)

## 2020-06-22 LAB — CBC WITH DIFFERENTIAL/PLATELET
Basophils Absolute: 0.1 10*3/uL (ref 0.0–0.2)
Basos: 1 %
EOS (ABSOLUTE): 0.1 10*3/uL (ref 0.0–0.4)
Eos: 3 %
Hematocrit: 48.4 % (ref 37.5–51.0)
Hemoglobin: 16.2 g/dL (ref 13.0–17.7)
Immature Grans (Abs): 0 10*3/uL (ref 0.0–0.1)
Immature Granulocytes: 0 %
Lymphocytes Absolute: 1.8 10*3/uL (ref 0.7–3.1)
Lymphs: 33 %
MCH: 30.6 pg (ref 26.6–33.0)
MCHC: 33.5 g/dL (ref 31.5–35.7)
MCV: 91 fL (ref 79–97)
Monocytes Absolute: 0.5 10*3/uL (ref 0.1–0.9)
Monocytes: 8 %
Neutrophils Absolute: 3.1 10*3/uL (ref 1.4–7.0)
Neutrophils: 55 %
Platelets: 263 10*3/uL (ref 150–450)
RBC: 5.3 x10E6/uL (ref 4.14–5.80)
RDW: 12.2 % (ref 11.6–15.4)
WBC: 5.6 10*3/uL (ref 3.4–10.8)

## 2020-06-22 LAB — LIPID PANEL
Chol/HDL Ratio: 4.1 ratio (ref 0.0–5.0)
Cholesterol, Total: 194 mg/dL (ref 100–199)
HDL: 47 mg/dL (ref >39)
LDL Chol Calc (NIH): 115 mg/dL — ABNORMAL HIGH (ref 0–99)
Triglycerides: 184 mg/dL — ABNORMAL HIGH (ref 0–149)
VLDL Cholesterol Cal: 32 mg/dL (ref 5–40)

## 2020-06-22 MED ORDER — PANTOPRAZOLE SODIUM 40 MG PO TBEC: 40.0000 mg | DELAYED_RELEASE_TABLET | Freq: Every day | ORAL | 1 refills | Status: DC

## 2020-06-22 MED ORDER — LISINOPRIL 20 MG PO TABS: 20.0000 mg | ORAL_TABLET | Freq: Every day | ORAL | 0 refills | Status: DC

## 2020-06-22 MED ORDER — NABUMETONE 500 MG PO TABS: 1000.0000 mg | ORAL_TABLET | Freq: Two times a day (BID) | ORAL | 1 refills | Status: DC

## 2020-06-22 MED ORDER — ROSUVASTATIN CALCIUM 20 MG PO TABS: 20.0000 mg | ORAL_TABLET | Freq: Every day | ORAL | 1 refills | Status: DC

## 2020-06-22 MED ORDER — PANTOPRAZOLE SODIUM 40 MG PO TBEC: 40.0000 mg | DELAYED_RELEASE_TABLET | Freq: Every day | ORAL | 0 refills | Status: DC

## 2020-06-22 NOTE — Progress Notes (Signed)
Subjective:  Patient ID: Gregg Morgan, male    DOB: [birth year]  Age: 60 y.o. MRN: [id number]  CC: Medical Management of Chronic Issues   HPI Toan Mort Power presents for  follow-up of hypertension. Patient has no history of headache chest pain or shortness of breath or recent cough. Patient also denies symptoms of TIA such as focal numbness or weakness. Patient denies side effects from medication. States taking it regularly.   History Terin has a past medical history of Coronary artery disease, GERD (gastroesophageal reflux disease), Hyperlipidemia, Hypertension, and Obstructive sleep apnea.   He has a past surgical history that includes none; Finger surgery; Cardiac catheterization; and ORIF finger fracture (08/03/2011).   His family history includes Colon cancer in his father; Hypertension in his mother; Thyroid disease in his mother.He reports that he quit smoking about 4 years ago. He has a 10.00 pack-year smoking history. He has never used smokeless tobacco. He reports current alcohol use of about 2.0 standard drinks of alcohol per week. He reports that he does not use drugs.  Current Outpatient Medications on File Prior to Visit  Medication Sig Dispense Refill  . aspirin 81 MG tablet Take 81 mg by mouth at bedtime.    . Cholecalciferol (VITAMIN D) 2000 UNITS CAPS Take 1 capsule by mouth at bedtime.    . fish oil-omega-3 fatty acids 1000 MG capsule Take 1,000 mg by mouth at bedtime.     No current facility-administered medications on file prior to visit.    ROS Review of Systems  Constitutional: Negative for fever.  Respiratory: Negative for shortness of breath.   Cardiovascular: Negative for chest pain.  Musculoskeletal: Positive for arthralgias (right knee pain with swelling X 2weeks. NKI).  Skin: Negative for rash.    Objective:  BP (!) 139/91   Pulse 66   Temp (!) 97 F (36.1 C)   Ht 5' 6.5" (1.689 m)   Wt 204 lb (92.5 kg)   SpO2 98%   BMI 32.43 kg/m    BP Readings from Last 3 Encounters:  06/22/20 (!) 139/91  10/12/19 124/79  12/08/18 116/76    Wt Readings from Last 3 Encounters:  06/22/20 204 lb (92.5 kg)  10/12/19 201 lb (91.2 kg)  12/08/18 195 lb (88.5 kg)     Physical Exam Vitals reviewed.  Constitutional:      Appearance: He is well-developed.  HENT:     Head: Normocephalic and atraumatic.     Right Ear: Tympanic membrane and external ear normal. No decreased hearing noted.     Left Ear: Tympanic membrane and external ear normal. No decreased hearing noted.     Mouth/Throat:     Pharynx: No oropharyngeal exudate or posterior oropharyngeal erythema.  Eyes:     Pupils: Pupils are equal, round, and reactive to light.  Cardiovascular:     Rate and Rhythm: Normal rate and regular rhythm.     Heart sounds: No murmur heard.   Pulmonary:     Effort: No respiratory distress.     Breath sounds: Normal breath sounds.  Abdominal:     General: Bowel sounds are normal.     Palpations: Abdomen is soft. There is no mass.     Tenderness: There is no abdominal tenderness.  Musculoskeletal:        General: Deformity (nodule at right Quad insertion at the tibial tuberosity) present.     Cervical back: Normal range of motion and neck supple.  Assessment & Plan:   Laiken was seen today for medical management of chronic issues.  Diagnoses and all orders for this visit:  Need for immunization against influenza -     Flu Vaccine QUAD 36+ mos IM  Essential hypertension -     lisinopril (ZESTRIL) 20 MG tablet; Take 1 tablet (20 mg total) by mouth daily. (Needs to be seen before next refill) -     CBC with Differential/Platelet -     CMP14+EGFR  Acute pain of right knee -     DG Knee 1-2 Views Right; Future  Screen for colon cancer -     Ambulatory referral to Gastroenterology  Mixed hyperlipidemia -     Lipid panel  Other orders -     pantoprazole (PROTONIX) 40 MG tablet; Take 1 tablet (40 mg total) by mouth  daily. (Needs to be seen before next refill) -     rosuvastatin (CRESTOR) 20 MG tablet; Take 1 tablet (20 mg total) by mouth daily. -     nabumetone (RELAFEN) 500 MG tablet; Take 2 tablets (1,000 mg total) by mouth 2 (two) times daily. For muscle and joint pain   Allergies as of 06/22/2020   No Known Allergies     Medication List       Accurate as of June 22, 2020  9:17 AM. If you have any questions, ask your nurse or doctor.        aspirin 81 MG tablet Take 81 mg by mouth at bedtime.   fish oil-omega-3 fatty acids 1000 MG capsule Take 1,000 mg by mouth at bedtime.   lisinopril 20 MG tablet Commonly known as: ZESTRIL Take 1 tablet (20 mg total) by mouth daily. (Needs to be seen before next refill)   nabumetone 500 MG tablet Commonly known as: RELAFEN Take 2 tablets (1,000 mg total) by mouth 2 (two) times daily. For muscle and joint pain Started by: Claretta Fraise, MD   pantoprazole 40 MG tablet Commonly known as: PROTONIX Take 1 tablet (40 mg total) by mouth daily. (Needs to be seen before next refill)   rosuvastatin 20 MG tablet Commonly known as: CRESTOR Take 1 tablet (20 mg total) by mouth daily.   Vitamin D 50 MCG (2000 UT) Caps Take 1 capsule by mouth at bedtime.       Meds ordered this encounter  Medications  . pantoprazole (PROTONIX) 40 MG tablet    Sig: Take 1 tablet (40 mg total) by mouth daily. (Needs to be seen before next refill)    Dispense:  30 tablet    Refill:  0  . lisinopril (ZESTRIL) 20 MG tablet    Sig: Take 1 tablet (20 mg total) by mouth daily. (Needs to be seen before next refill)    Dispense:  30 tablet    Refill:  0  . rosuvastatin (CRESTOR) 20 MG tablet    Sig: Take 1 tablet (20 mg total) by mouth daily.    Dispense:  90 tablet    Refill:  1  . nabumetone (RELAFEN) 500 MG tablet    Sig: Take 2 tablets (1,000 mg total) by mouth 2 (two) times daily. For muscle and joint pain    Dispense:  360 tablet    Refill:  1       Follow-up: Return in about 6 months (around 12/23/2020) for [facility] physical.  Claretta Fraise, M.D.

## 2020-06-22 NOTE — Telephone Encounter (Signed)
Pts wife called stating that Dr Darlyn Read on sent in 30 day supply for some of pts medicines and insurance wont pay for them unless they are sent as 90 days supplies.  Please send all medicines as 90 day supplies to [facility].

## 2020-06-24 NOTE — Progress Notes (Signed)
Hello Gregg Morgan,  Your lab result is normal and/or stable.Some minor variations that are not significant are commonly marked abnormal, but do not represent any medical problem for you.  Best regards, Hollynn Garno, M.D.

## 2020-08-10 ENCOUNTER — Encounter: Payer: Self-pay | Admitting: Gastroenterology

## 2020-10-02 ENCOUNTER — Encounter: Payer: BC Managed Care – PPO | Admitting: Gastroenterology

## 2020-10-30 ENCOUNTER — Other Ambulatory Visit: Payer: Self-pay

## 2020-10-30 ENCOUNTER — Encounter: Payer: Self-pay | Admitting: Family Medicine

## 2020-10-30 ENCOUNTER — Ambulatory Visit (INDEPENDENT_AMBULATORY_CARE_PROVIDER_SITE_OTHER): Payer: BC Managed Care – PPO | Admitting: Family Medicine

## 2020-10-30 VITALS — BP 141/87 | HR 66 | Temp 97.6°F | Ht 66.5 in | Wt 203.4 lb

## 2020-10-30 DIAGNOSIS — S335XXA Sprain of ligaments of lumbar spine, initial encounter: Secondary | ICD-10-CM

## 2020-10-30 MED ORDER — TIZANIDINE HCL 4 MG PO CAPS: 4.0000 mg | ORAL_CAPSULE | Freq: Three times a day (TID) | ORAL | 0 refills | Status: DC | PRN

## 2020-10-30 MED ORDER — BETAMETHASONE SOD PHOS & ACET 6 (3-3) MG/ML IJ SUSP
6.0000 mg | Freq: Once | INTRAMUSCULAR | Status: AC
  Administered 2020-10-30: 6 mg via INTRAMUSCULAR

## 2020-10-30 MED ORDER — PANTOPRAZOLE SODIUM 40 MG PO TBEC: 40.0000 mg | DELAYED_RELEASE_TABLET | Freq: Two times a day (BID) | ORAL | 1 refills | Status: DC

## 2020-10-30 NOTE — Patient Instructions (Signed)
Back Exercises The following exercises strengthen the muscles that help to support the trunk and back. They also help to keep the lower back flexible. Doing these exercises can help to prevent back pain or lessen existing pain. If you have back pain or discomfort, try doing these exercises 2-3 times each day or as told by your health care provider. As your pain improves, do them once each day, but increase the number of times that you repeat the steps for each exercise (do more repetitions). To prevent the recurrence of back pain, continue to do these exercises once each day or as told by your health care provider. Do exercises exactly as told by your health care provider and adjust them as directed. It is normal to feel mild stretching, pulling, tightness, or discomfort as you do these exercises, but you should stop right away if youfeel sudden pain or your pain gets worse. Exercises Single knee to chest Repeat these steps 3-5 times for each leg: Lie on your back on a firm bed or the floor with your legs extended. Bring one knee to your chest. Your other leg should stay extended and in contact with the floor. Hold your knee in place by grabbing your knee or thigh with both hands and hold. Pull on your knee until you feel a gentle stretch in your lower back or buttocks. Hold the stretch for 10-30 seconds. Slowly release and straighten your leg. Pelvic tilt Repeat these steps 5-10 times: Lie on your back on a firm bed or the floor with your legs extended. Bend your knees so they are pointing toward the ceiling and your feet are flat on the floor. Tighten your lower abdominal muscles to press your lower back against the floor. This motion will tilt your pelvis so your tailbone points up toward the ceiling instead of pointing to your feet or the floor. With gentle tension and even breathing, hold this position for 5-10 seconds. Cat-cow Repeat these steps until your lower back becomes more  flexible: Get into a hands-and-knees position on a firm surface. Keep your hands under your shoulders, and keep your knees under your hips. You may place padding under your knees for comfort. Let your head hang down toward your chest. Contract your abdominal muscles and point your tailbone toward the floor so your lower back becomes rounded like the back of a cat. Hold this position for 5 seconds. Slowly lift your head, let your abdominal muscles relax and point your tailbone up toward the ceiling so your back forms a sagging arch like the back of a cow. Hold this position for 5 seconds.  Press-ups Repeat these steps 5-10 times: Lie on your abdomen (face-down) on the floor. Place your palms near your head, about shoulder-width apart. Keeping your back as relaxed as possible and keeping your hips on the floor, slowly straighten your arms to raise the top half of your body and lift your shoulders. Do not use your back muscles to raise your upper torso. You may adjust the placement of your hands to make yourself more comfortable. Hold this position for 5 seconds while you keep your back relaxed. Slowly return to lying flat on the floor.  Bridges Repeat these steps 10 times: Lie on your back on a firm surface. Bend your knees so they are pointing toward the ceiling and your feet are flat on the floor. Your arms should be flat at your sides, next to your body. Tighten your buttocks muscles and lift your   buttocks off the floor until your waist is at almost the same height as your knees. You should feel the muscles working in your buttocks and the back of your thighs. If you do not feel these muscles, slide your feet 1-2 inches farther away from your buttocks. Hold this position for 3-5 seconds. Slowly lower your hips to the starting position, and allow your buttocks muscles to relax completely. If this exercise is too easy, try doing it with your arms crossed over yourchest. Abdominal  crunches Repeat these steps 5-10 times: Lie on your back on a firm bed or the floor with your legs extended. Bend your knees so they are pointing toward the ceiling and your feet are flat on the floor. Cross your arms over your chest. Tip your chin slightly toward your chest without bending your neck. Tighten your abdominal muscles and slowly raise your trunk (torso) high enough to lift your shoulder blades a tiny bit off the floor. Avoid raising your torso higher than that because it can put too much stress on your low back and does not help to strengthen your abdominal muscles. Slowly return to your starting position. Back lifts Repeat these steps 5-10 times: Lie on your abdomen (face-down) with your arms at your sides, and rest your forehead on the floor. Tighten the muscles in your legs and your buttocks. Slowly lift your chest off the floor while you keep your hips pressed to the floor. Keep the back of your head in line with the curve in your back. Your eyes should be looking at the floor. Hold this position for 3-5 seconds. Slowly return to your starting position. Contact a health care provider if: Your back pain or discomfort gets much worse when you do an exercise. Your worsening back pain or discomfort does not lessen within 2 hours after you exercise. If you have any of these problems, stop doing these exercises right away. Do not do them again unless your health care provider says that you can. Get help right away if: You develop sudden, severe back pain. If this happens, stop doing the exercises right away. Do not do them again unless your health care provider says that you can. This information is not intended to replace advice given to you by your health care provider. Make sure you discuss any questions you have with your healthcare provider. Document Revised: 08/06/2018 Document Reviewed: 01/01/2018 Elsevier Patient Education  2022 [employer]  

## 2020-10-30 NOTE — Progress Notes (Signed)
Subjective:  Patient ID: Gregg Morgan, male    DOB: 07/23/1960  Age: 60 y.o. MRN: [id number]  CC: Back Pain   HPI Gregg Morgan presents for three days of right lower back pain. NKI. Hurts in his back when he puts weight on the leg. It is also painful to flex the hip of the extended leg. No pain on the left. Needs to get back to work.   Patient in for follow-up of GERD. Currently asymptomatic taking  PPI daily. There is belching and heartburn. No hematemesis and no melena. No dysphagia or choking. Onset is remote. Progression is stable. Complicating factors, none.   Depression screen [facility] 2/9 10/30/2020 06/22/2020 10/12/2019  Decreased Interest 0 0 0  Down, Depressed, Hopeless 0 0 0  PHQ - 2 Score 0 0 0    History Gregg Morgan has a past medical history of Coronary artery disease, GERD (gastroesophageal reflux disease), Hyperlipidemia, Hypertension, and Obstructive sleep apnea.   He has a past surgical history that includes none; Finger surgery; Cardiac catheterization; and ORIF finger fracture (08/03/2011).   His family history includes Colon cancer in his father; Hypertension in his mother; Thyroid disease in his mother.He reports that he quit smoking about 4 years ago. His smoking use included cigarettes. He has a 10.00 pack-year smoking history. He has never used smokeless tobacco. He reports current alcohol use of about 2.0 standard drinks of alcohol per week. He reports that he does not use drugs.    ROS Review of Systems  Constitutional:  Negative for chills, diaphoresis and fever.  HENT:  Negative for sore throat.   Respiratory:  Negative for shortness of breath.   Cardiovascular:  Negative for chest pain.  Gastrointestinal:  Negative for abdominal pain.  Musculoskeletal:  Positive for arthralgias, back pain, gait problem and myalgias. Negative for neck pain.  Skin:  Negative for rash.  Neurological:  Positive for weakness. Negative for numbness.   Objective:  BP (!)  141/87   Pulse 66   Temp 97.6 F (36.4 C)   Ht 5' 6.5" (1.689 m)   Wt 203 lb 6.4 oz (92.3 kg)   SpO2 98%   BMI 32.34 kg/m   BP Readings from Last 3 Encounters:  10/30/20 (!) 141/87  06/22/20 (!) 139/91  10/12/19 124/79    Wt Readings from Last 3 Encounters:  10/30/20 203 lb 6.4 oz (92.3 kg)  06/22/20 204 lb (92.5 kg)  10/12/19 201 lb (91.2 kg)     Physical Exam Vitals reviewed.  Constitutional:      Appearance: He is well-developed.  HENT:     Head: Normocephalic.  Eyes:     Pupils: Pupils are equal, round, and reactive to light.  Cardiovascular:     Rate and Rhythm: Normal rate and regular rhythm.     Heart sounds: Normal heart sounds. No murmur heard. Pulmonary:     Effort: Pulmonary effort is normal.     Breath sounds: Normal breath sounds.  Abdominal:     Tenderness: There is no abdominal tenderness.  Musculoskeletal:        General: Tenderness (at L5 area muscles, to the right. Palpable spasm at the L5 spinalis) present.     Cervical back: Normal range of motion.  Skin:    General: Skin is warm and dry.  Neurological:     Mental Status: He is alert and oriented to person, place, and time.     Deep Tendon Reflexes: Reflexes are normal and symmetric.  Psychiatric:        Behavior: Behavior normal.        Thought Content: Thought content normal.      Assessment & Plan:   Gregg Morgan was seen today for back pain.  Diagnoses and all orders for this visit:  Lumbar sprain, initial encounter -     betamethasone acetate-betamethasone sodium phosphate (CELESTONE) injection 6 mg  Other orders -     pantoprazole (PROTONIX) 40 MG tablet; Take 1 tablet (40 mg total) by mouth 2 (two) times daily. -     tizanidine (ZANAFLEX) 4 MG capsule; Take 1 capsule (4 mg total) by mouth 3 (three) times daily as needed for muscle spasms.      I have changed Gregg Morgan's pantoprazole. I am also having him start on tiZANidine. Additionally, I am having him maintain his  aspirin, fish oil-omega-3 fatty acids, Vitamin D, rosuvastatin, nabumetone, and lisinopril. We will continue to administer betamethasone acetate-betamethasone sodium phosphate.  Allergies as of 10/30/2020   No Known Allergies      Medication List        Accurate as of October 30, 2020 11:05 AM. If you have any questions, ask your nurse or doctor.          aspirin 81 MG tablet Take 81 mg by mouth at bedtime.   fish oil-omega-3 fatty acids 1000 MG capsule Take 1,000 mg by mouth at bedtime.   lisinopril 20 MG tablet Commonly known as: ZESTRIL Take 1 tablet (20 mg total) by mouth daily.   nabumetone 500 MG tablet Commonly known as: RELAFEN Take 2 tablets (1,000 mg total) by mouth 2 (two) times daily. For muscle and joint pain   pantoprazole 40 MG tablet Commonly known as: PROTONIX Take 1 tablet (40 mg total) by mouth 2 (two) times daily. What changed: when to take this Changed by: Mechele Claude, MD   rosuvastatin 20 MG tablet Commonly known as: CRESTOR Take 1 tablet (20 mg total) by mouth daily.   tiZANidine 4 MG capsule Commonly known as: ZANAFLEX Take 1 capsule (4 mg total) by mouth 3 (three) times daily as needed for muscle spasms. Started by: Mechele Claude, MD   Vitamin D 50 MCG (2000 UT) Caps Take 1 capsule by mouth at bedtime.         Follow-up: Return in about 2 weeks (around 11/13/2020).  Mechele Claude, M.D.

## 2020-11-03 ENCOUNTER — Encounter: Payer: Self-pay | Admitting: Gastroenterology

## 2020-11-03 ENCOUNTER — Ambulatory Visit (INDEPENDENT_AMBULATORY_CARE_PROVIDER_SITE_OTHER): Payer: BC Managed Care – PPO | Admitting: Gastroenterology

## 2020-11-03 VITALS — BP 146/90 | HR 70 | Ht 66.5 in | Wt 204.2 lb

## 2020-11-03 DIAGNOSIS — Z800 Family history of malignant neoplasm of digestive organs: Secondary | ICD-10-CM

## 2020-11-03 DIAGNOSIS — R142 Eructation: Secondary | ICD-10-CM

## 2020-11-03 DIAGNOSIS — R011 Cardiac murmur, unspecified: Secondary | ICD-10-CM

## 2020-11-03 DIAGNOSIS — Z1211 Encounter for screening for malignant neoplasm of colon: Secondary | ICD-10-CM

## 2020-11-03 DIAGNOSIS — Z1212 Encounter for screening for malignant neoplasm of rectum: Secondary | ICD-10-CM

## 2020-11-03 DIAGNOSIS — K449 Diaphragmatic hernia without obstruction or gangrene: Secondary | ICD-10-CM

## 2020-11-03 DIAGNOSIS — K219 Gastro-esophageal reflux disease without esophagitis: Secondary | ICD-10-CM

## 2020-11-03 NOTE — Progress Notes (Addendum)
Chief Complaint: GERD, CRC screening  Referring Provider:    Mechele Claude, MD   HPI:     Gregg Morgan is a 60 y.o. male with a history of CAD, HTN, HLD, OSA, prior tobacco use, referred to the [facility]ology [facility] for evaluation of GERD.  Longstanding history of reflux, characterized by heartburn and regurgitation.  Symptoms have been worsening over the last couple of months, with associated belching. +nocturnal cough. Worse with supine along with ice cream and eating close to bedtime. No dysphagia. Has been taking Protonix for years. Recently increased his Protonix to 40 mg BID earlier this week.   Separately, patient due for colonoscopy for continued colon cancer screening.  No lower GI symptoms. Family history notable for father with colon cancer, likely diagnosed <60. Last colonoscopy was 10+ years ago per patient.   Endoscopic History: - EGD (08/2010): Stricture at [facility] dilated with 18 mm Maloney.  4 cm HH - Colonoscopy approximately 10+ years ago normal per patient.  No report in EMR for review  Past Medical History:  Diagnosis Date   Coronary artery disease    GERD (gastroesophageal reflux disease)    Hyperlipidemia    Hypertension    Obstructive sleep apnea      Past Surgical History:  Procedure Laterality Date   CARDIAC CATHETERIZATION     09/2009--DR  BERRY   FINGER SURGERY     none     ORIF FINGER FRACTURE  08/03/2011   Procedure: OPEN REDUCTION INTERNAL FIXATION (ORIF) METACARPAL (FINGER) FRACTURE;  Surgeon: Sharma Covert, MD;  Location: [facility] OR;  Service: Orthopedics;  Laterality: Right;  RIGHT THUMB ORIF   Family History  Problem Relation Age of Onset   Colon cancer Father    Thyroid disease Mother    Hypertension Mother    Social History   Tobacco Use   Smoking status: Former    Packs/day: 0.50    Years: 20.00    Pack years: 10.00    Types: Cigarettes    Quit date: 03/12/2016    Years since quitting: 4.6   Smokeless tobacco:  Never  Substance Use Topics   Alcohol use: Yes    Alcohol/week: 2.0 standard drinks    Types: 2 Cans of beer per week    Comment: Occasion on weekends   Drug use: No   Current Outpatient Medications  Medication Sig Dispense Refill   aspirin 81 MG tablet Take 81 mg by mouth at bedtime.     Cholecalciferol (VITAMIN D) 2000 UNITS CAPS Take 1 capsule by mouth at bedtime.     fish oil-omega-3 fatty acids 1000 MG capsule Take 1,000 mg by mouth at bedtime.     lisinopril (ZESTRIL) 20 MG tablet Take 1 tablet (20 mg total) by mouth daily. 90 tablet 1   nabumetone (RELAFEN) 500 MG tablet Take 2 tablets (1,000 mg total) by mouth 2 (two) times daily. For muscle and joint pain 360 tablet 1   pantoprazole (PROTONIX) 40 MG tablet Take 1 tablet (40 mg total) by mouth 2 (two) times daily. 180 tablet 1   rosuvastatin (CRESTOR) 20 MG tablet Take 1 tablet (20 mg total) by mouth daily. 90 tablet 1   tizanidine (ZANAFLEX) 4 MG capsule Take 1 capsule (4 mg total) by mouth 3 (three) times daily as needed for muscle spasms. 90 capsule 0   No current facility-administered medications for this visit.   No Known Allergies  Review of Systems: All systems reviewed and negative except where noted in HPI.     Physical Exam:    Wt Readings from Last 3 Encounters:  10/30/20 203 lb 6.4 oz (92.3 kg)  06/22/20 204 lb (92.5 kg)  10/12/19 201 lb (91.2 kg)    There were no vitals taken for this visit. Constitutional:  Pleasant, in no acute distress. Psychiatric: Normal mood and affect. Behavior is normal. EENT: Pupils normal.  Conjunctivae are normal. No scleral icterus. Neck supple. No cervical LAD. Cardiovascular: Subtle 2/6 SEM. Normal rate, regular rhythm. No edema Pulmonary/chest: Effort normal and breath sounds normal. No wheezing, rales or rhonchi. Abdominal: Soft, nondistended, nontender. Bowel sounds active throughout. There are no masses palpable. No hepatomegaly. Neurological: Alert and oriented to  person place and time. Skin: Skin is warm and dry. No rashes noted.   ASSESSMENT AND PLAN;   1) GERD 2) Hiatal hernia 3) Belching - EGD to evaluate for erosive esophagitis given breakthrough symptoms - Evaluate size/severity of hernia and degree of LES laxity at time of EGD - Continue high-dose PPI as recently prescribed.  Recommended taking evening dose prior to dinner (currently taking prior to bedtime) - Continue antireflux lifestyle/dietary modifications  4) Family history of colon cancer - Overdue for CRC screening - Schedule colonoscopy to be done at the same time as upper endoscopy as above  5) New murmur 6) History of pericarditis - Referral to Cardiology    The indications, risks, and benefits of EGD and colonoscopy were explained to the patient in detail. Risks include but are not limited to bleeding, perforation, adverse reaction to medications, and cardiopulmonary compromise. Sequelae include but are not limited to the possibility of surgery, hospitalization, and mortality. The patient verbalized understanding and wished to proceed. All questions answered, referred to scheduler and bowel prep ordered. Further recommendations pending results of the exam.    Shellia Cleverly, DO, FACG  11/03/2020, 2:13 PM   Mechele Claude, MD

## 2020-11-03 NOTE — Patient Instructions (Signed)
If you are age 60 or older, your body mass index should be between 23-30. Your Body mass index is 32.47 kg/m. If this is out of the aforementioned range listed, please consider follow up with your Primary Care Provider.  If you are age 59 or younger, your body mass index should be between 19-25. Your Body mass index is 32.47 kg/m. If this is out of the aformentioned range listed, please consider follow up with your Primary Care Provider.   We have placed a referral for [facility] per your request. They will contact you for an appointment.  It has been recommended to you by your physician that you have a(n) EGD/Colonoscp[u completed. We did not schedule the procedure(s) today. We will contact you when we have received clearance from cardiology to schedule this procedure at [facility].  Due to recent changes in healthcare laws, you may see the results of your imaging and laboratory studies on MyChart before your provider has had a chance to review them.  We understand that in some cases there may be results that are confusing or concerning to you. Not all laboratory results come back in the same time frame and the provider may be waiting for multiple results in order to interpret others.  Please give [place] 48 hours in order for your provider to thoroughly review all the results before contacting the office for clarification of your results.  Thank you for choosing me and [facility].  Vito Cirigliano, D.O.

## 2020-11-06 ENCOUNTER — Telehealth: Payer: Self-pay | Admitting: General Surgery

## 2020-11-06 NOTE — Telephone Encounter (Signed)
Contact the patients wife and expressed that the patient is able to have his ECL in the [facility]. She stated it would have to be on a Friday. Gave her several dates in September and she will call back to schedule.

## 2020-11-06 NOTE — Telephone Encounter (Signed)
-----   Message from Shellia Cleverly, DO sent at 11/03/2020  3:34 PM EDT ----- Please see below. Patient clear to be scheduled at [facility] per Cathlyn Parsons. Please call to set up EGD/Colo. Thanks.  ----- Message ----- From: Cathlyn Parsons, CRNA Sent: 11/03/2020   3:33 PM EDT To: Shellia Cleverly, DO  Dr. Barron Alvine,  I think what you are referring to in his hx is difficult intubation designated as a pertinent negative.  Its confusing but these items are conditions the pt does not suffer from. So he is cleared for anesthetic care at [facility].   Thanks,  Cathlyn Parsons ----- Message ----- From: Shellia Cleverly, DO Sent: 11/03/2020   2:58 PM EDT To: Cathlyn Parsons, CRNA  Jonny Ruiz,  Hoping you can look over this chart. Patient needs EGD/Colo (non-urgent). There is some line in his chart about difficult intubation from 2013. I looked through surgical/Anesthesia notes, and none mention difficult airway/intubation (pre-op was Mallampati 2). But he did get General Anesthesia and airway was LMA, which makes me think that was used due to difficult intubation even though not clearly documented. Thoughts?   Thanks.  NIKE

## 2020-11-08 ENCOUNTER — Encounter: Payer: Self-pay | Admitting: Gastroenterology

## 2020-11-17 ENCOUNTER — Ambulatory Visit (INDEPENDENT_AMBULATORY_CARE_PROVIDER_SITE_OTHER): Payer: BC Managed Care – PPO | Admitting: Family Medicine

## 2020-11-17 ENCOUNTER — Other Ambulatory Visit: Payer: Self-pay

## 2020-11-17 ENCOUNTER — Ambulatory Visit: Payer: BC Managed Care – PPO | Admitting: Family Medicine

## 2020-11-17 ENCOUNTER — Encounter: Payer: Self-pay | Admitting: Family Medicine

## 2020-11-17 VITALS — BP 147/81 | HR 67 | Temp 97.5°F | Ht 66.5 in | Wt 201.0 lb

## 2020-11-17 DIAGNOSIS — I10 Essential (primary) hypertension: Secondary | ICD-10-CM | POA: Diagnosis not present

## 2020-11-17 DIAGNOSIS — E559 Vitamin D deficiency, unspecified: Secondary | ICD-10-CM

## 2020-11-17 DIAGNOSIS — K219 Gastro-esophageal reflux disease without esophagitis: Secondary | ICD-10-CM

## 2020-11-17 DIAGNOSIS — Z23 Encounter for immunization: Secondary | ICD-10-CM | POA: Diagnosis not present

## 2020-11-17 DIAGNOSIS — Z0001 Encounter for general adult medical examination with abnormal findings: Secondary | ICD-10-CM

## 2020-11-17 DIAGNOSIS — I25119 Atherosclerotic heart disease of native coronary artery with unspecified angina pectoris: Secondary | ICD-10-CM | POA: Diagnosis not present

## 2020-11-17 DIAGNOSIS — Z0000 Encounter for general adult medical examination without abnormal findings: Secondary | ICD-10-CM

## 2020-11-17 DIAGNOSIS — E782 Mixed hyperlipidemia: Secondary | ICD-10-CM

## 2020-11-17 DIAGNOSIS — Z125 Encounter for screening for malignant neoplasm of prostate: Secondary | ICD-10-CM | POA: Diagnosis not present

## 2020-11-17 DIAGNOSIS — R011 Cardiac murmur, unspecified: Secondary | ICD-10-CM

## 2020-11-17 NOTE — Addendum Note (Signed)
Addended by: Adella Hare B on: 11/17/2020 10:21 AM   Modules accepted: Orders

## 2020-11-17 NOTE — Progress Notes (Addendum)
Subjective:  Patient ID: Gregg Morgan, male    DOB: Aug 05, 1960  Age: 60 y.o. MRN: [id number]  CC: Annual Exam   HPI Gregg Morgan presents for CPE.   Possibly a murmur. One was heard at GI evaluation recently. Back getting better. Taking tizanidine and nabumetone.   Supervisor at [employer].   Depression screen [facility] 2/9 11/17/2020 10/30/2020 06/22/2020  Decreased Interest 0 0 0  Down, Depressed, Hopeless 0 0 0  PHQ - 2 Score 0 0 0    History Gregg Morgan has a past medical history of Coronary artery disease, GERD (gastroesophageal reflux disease), Hyperlipidemia, Hypertension, and Obstructive sleep apnea.   Gregg Morgan has a past surgical history that includes none; Finger surgery; Cardiac catheterization; and ORIF finger fracture (08/03/2011).   His family history includes Colon cancer in his father; Hypertension in his mother; Thyroid disease in his mother.Gregg Morgan reports that Gregg Morgan quit smoking about 4 years ago. His smoking use included cigarettes. Gregg Morgan has a 10.00 pack-year smoking history. Gregg Morgan has never used smokeless tobacco. Gregg Morgan reports current alcohol use of about 2.0 standard drinks of alcohol per week. Gregg Morgan reports that Gregg Morgan does not use drugs.    ROS Review of Systems  Constitutional:  Negative for chills, diaphoresis and fever.  HENT:  Negative for congestion, ear pain, hearing loss, nosebleeds, sore throat and tinnitus.   Eyes:  Negative for photophobia, pain, discharge and redness.  Respiratory:  Negative for cough, shortness of breath and wheezing.   Cardiovascular:  Negative for chest pain, palpitations and leg swelling.  Gastrointestinal:  Negative for abdominal pain, blood in stool, constipation, diarrhea, nausea and vomiting.  Endocrine: Negative for polydipsia.  Genitourinary:  Negative for dysuria, flank pain, frequency, hematuria and urgency.  Musculoskeletal:  Negative for back pain, myalgias and neck pain.  Skin:  Negative for rash.  Allergic/Immunologic:  Negative for environmental allergies.  Neurological:  Negative for dizziness, tremors, seizures, weakness and headaches.  Hematological:  Does not bruise/bleed easily.  Psychiatric/Behavioral:  Negative for hallucinations and suicidal ideas. The patient is not nervous/anxious.    Objective:  BP (!) 147/81   Pulse 67   Temp (!) 97.5 F (36.4 C)   Ht 5' 6.5" (1.689 m)   Wt 201 lb (91.2 kg)   SpO2 98%   BMI 31.96 kg/m   BP Readings from Last 3 Encounters:  11/17/20 (!) 147/81  11/03/20 (!) 146/90  10/30/20 (!) 141/87    Wt Readings from Last 3 Encounters:  11/17/20 201 lb (91.2 kg)  11/03/20 204 lb 4 oz (92.6 kg)  10/30/20 203 lb 6.4 oz (92.3 kg)     Physical Exam Constitutional:      Appearance: Gregg Morgan is well-developed.  HENT:     Head: Normocephalic and atraumatic.  Eyes:     Pupils: Pupils are equal, round, and reactive to light.  Neck:     Thyroid: No thyromegaly.     Trachea: No tracheal deviation.  Cardiovascular:     Rate and Rhythm: Normal rate and regular rhythm.     Heart sounds: Normal heart sounds. No murmur heard.   No friction rub. No gallop.  Pulmonary:     Breath sounds: Normal breath sounds. No wheezing or rales.  Abdominal:     General: Bowel sounds are normal. There is no distension.     Palpations: Abdomen is soft. There is no mass.     Tenderness: There is no abdominal tenderness.     Hernia: There  is no hernia in the left inguinal area.  Genitourinary:    Penis: Normal.      Testes: Normal.  Musculoskeletal:        General: Normal range of motion.     Cervical back: Normal range of motion.  Lymphadenopathy:     Cervical: No cervical adenopathy.  Skin:    General: Skin is warm and dry.  Neurological:     Mental Status: Gregg Morgan is alert and oriented to person, place, and time.      Assessment & Plan:   Gregg Morgan was seen today for annual exam.  Diagnoses and all orders for this visit:  Well adult exam -     CBC with  Differential/Platelet -     CMP14+EGFR -     Lipid panel -     PSA, total and free -     VITAMIN D 25 Hydroxy (Vit-D Deficiency, Fractures)  Primary hypertension -     CBC with Differential/Platelet -     CMP14+EGFR  Gastroesophageal reflux disease without esophagitis -     CBC with Differential/Platelet -     CMP14+EGFR  Coronary artery disease involving native coronary artery of native heart with angina pectoris ([place]) -     CBC with Differential/Platelet -     CMP14+EGFR  Murmur, cardiac -     CBC with Differential/Platelet -     CMP14+EGFR  Mixed hyperlipidemia -     Lipid panel  Screening for prostate cancer -     PSA, total and free  Vitamin D deficiency -     VITAMIN D 25 Hydroxy (Vit-D Deficiency, Fractures)      I am having Gregg Morgan maintain his aspirin, fish oil-omega-3 fatty acids, Vitamin D, rosuvastatin, nabumetone, lisinopril, pantoprazole, and tiZANidine.  Allergies as of 11/17/2020   No Known Allergies      Medication List        Accurate as of November 17, 2020 10:18 AM. If you have any questions, ask your nurse or doctor.          aspirin 81 MG tablet Take 81 mg by mouth at bedtime.   fish oil-omega-3 fatty acids 1000 MG capsule Take 1,000 mg by mouth at bedtime.   lisinopril 20 MG tablet Commonly known as: ZESTRIL Take 1 tablet (20 mg total) by mouth daily.   nabumetone 500 MG tablet Commonly known as: RELAFEN Take 2 tablets (1,000 mg total) by mouth 2 (two) times daily. For muscle and joint pain   pantoprazole 40 MG tablet Commonly known as: PROTONIX Take 1 tablet (40 mg total) by mouth 2 (two) times daily.   rosuvastatin 20 MG tablet Commonly known as: CRESTOR Take 1 tablet (20 mg total) by mouth daily.   tiZANidine 4 MG capsule Commonly known as: ZANAFLEX Take 1 capsule (4 mg total) by mouth 3 (three) times daily as needed for muscle spasms.   Vitamin D 50 MCG (2000 UT) Caps Take 1 capsule by mouth at bedtime.          Follow-up: Return in about 6 months (around 05/20/2021) for [facility] physical.  Claretta Fraise, M.D.

## 2020-11-18 LAB — CBC WITH DIFFERENTIAL/PLATELET
Basophils Absolute: 0.1 10*3/uL (ref 0.0–0.2)
Basos: 1 %
EOS (ABSOLUTE): 0.1 10*3/uL (ref 0.0–0.4)
Eos: 2 %
Hematocrit: 44.1 % (ref 37.5–51.0)
Hemoglobin: 15.2 g/dL (ref 13.0–17.7)
Immature Grans (Abs): 0 10*3/uL (ref 0.0–0.1)
Immature Granulocytes: 0 %
Lymphocytes Absolute: 1.8 10*3/uL (ref 0.7–3.1)
Lymphs: 33 %
MCH: 31.3 pg (ref 26.6–33.0)
MCHC: 34.5 g/dL (ref 31.5–35.7)
MCV: 91 fL (ref 79–97)
Monocytes Absolute: 0.5 10*3/uL (ref 0.1–0.9)
Monocytes: 9 %
Neutrophils Absolute: 3.1 10*3/uL (ref 1.4–7.0)
Neutrophils: 55 %
Platelets: 223 10*3/uL (ref 150–450)
RBC: 4.86 x10E6/uL (ref 4.14–5.80)
RDW: 12.8 % (ref 11.6–15.4)
WBC: 5.6 10*3/uL (ref 3.4–10.8)

## 2020-11-18 LAB — CMP14+EGFR
ALT: 23 IU/L (ref 0–44)
AST: 18 IU/L (ref 0–40)
Albumin/Globulin Ratio: 1.7 (ref 1.2–2.2)
Albumin: 4.5 g/dL (ref 3.8–4.9)
Alkaline Phosphatase: 93 IU/L (ref 44–121)
BUN/Creatinine Ratio: 15 (ref 10–24)
BUN: 14 mg/dL (ref 8–27)
Bilirubin Total: 0.4 mg/dL (ref 0.0–1.2)
CO2: 22 mmol/L (ref 20–29)
Calcium: 9.3 mg/dL (ref 8.6–10.2)
Chloride: 100 mmol/L (ref 96–106)
Creatinine, Ser: 0.94 mg/dL (ref 0.76–1.27)
Globulin, Total: 2.7 g/dL (ref 1.5–4.5)
Glucose: 104 mg/dL — ABNORMAL HIGH (ref 65–99)
Potassium: 4.9 mmol/L (ref 3.5–5.2)
Sodium: 139 mmol/L (ref 134–144)
Total Protein: 7.2 g/dL (ref 6.0–8.5)
eGFR: 93 mL/min/{1.73_m2} (ref >59)

## 2020-11-18 LAB — LIPID PANEL
Chol/HDL Ratio: 3.7 ratio (ref 0.0–5.0)
Cholesterol, Total: 194 mg/dL (ref 100–199)
HDL: 52 mg/dL (ref >39)
LDL Chol Calc (NIH): 118 mg/dL — ABNORMAL HIGH (ref 0–99)
Triglycerides: 136 mg/dL (ref 0–149)
VLDL Cholesterol Cal: 24 mg/dL (ref 5–40)

## 2020-11-18 LAB — PSA, TOTAL AND FREE
PSA, Free Pct: 25.6 %
PSA, Free: 0.23 ng/mL
Prostate Specific Ag, Serum: 0.9 ng/mL (ref 0.0–4.0)

## 2020-11-18 LAB — VITAMIN D 25 HYDROXY (VIT D DEFICIENCY, FRACTURES): Vit D, 25-Hydroxy: 25.9 ng/mL — ABNORMAL LOW (ref 30.0–100.0)

## 2020-11-21 ENCOUNTER — Other Ambulatory Visit: Payer: Self-pay | Admitting: *Deleted

## 2020-11-21 ENCOUNTER — Other Ambulatory Visit: Payer: Self-pay | Admitting: Family Medicine

## 2020-11-21 MED ORDER — VITAMIN D (ERGOCALCIFEROL) 1.25 MG (50000 UNIT) PO CAPS: 50000.0000 [IU] | ORAL_CAPSULE | ORAL | 3 refills | Status: DC

## 2020-11-21 NOTE — Telephone Encounter (Signed)
Referral placed, as requested WS 

## 2020-11-21 NOTE — Progress Notes (Signed)
Dear Gregg Morgan, Your Vitamin D is  low. You need a prescription strength supplement I will send that in for you. Nurse, if at all possible, could you send in a prescription for the patient for vitamin D 50,000 units, 1 p.o. weekly #13 with 3 refills? Many thanks, WS

## 2020-11-30 ENCOUNTER — Ambulatory Visit (AMBULATORY_SURGERY_CENTER): Payer: BC Managed Care – PPO | Admitting: *Deleted

## 2020-11-30 ENCOUNTER — Other Ambulatory Visit: Payer: Self-pay

## 2020-11-30 VITALS — Ht 66.5 in | Wt 205.0 lb

## 2020-11-30 DIAGNOSIS — Z800 Family history of malignant neoplasm of digestive organs: Secondary | ICD-10-CM

## 2020-11-30 DIAGNOSIS — K219 Gastro-esophageal reflux disease without esophagitis: Secondary | ICD-10-CM

## 2020-11-30 MED ORDER — CLENPIQ 10-3.5-12 MG-GM -GM/160ML PO SOLN: 1.0000 | ORAL | 0 refills | Status: DC

## 2020-11-30 NOTE — Progress Notes (Signed)
No egg or soy allergy known to patient  No issues with past sedation with any surgeries or procedures Patient denies ever being told they had issues or difficulty with intubation  No FH of Malignant Hyperthermia No diet pills per patient No home 02 use per patient  No blood thinners per patient  Pt denies issues with constipation  No A fib or A flutter  EMMI video to pt or via MyChart  COVID 19 guidelines implemented in PV today with Pt and RN  Pt is fully vaccinated  for [employer] given to pt in PV today , Code to Pharmacy and  NO PA's for preps discussed with pt In PV today  Discussed with pt there will be an out-of-pocket cost for prep and that varies from $0 to 70 dollars   Due to the COVID-19 pandemic we are asking patients to follow certain guidelines.  Pt aware of COVID protocols and [facility] guidelines   Pt verified name, DOB, address and insurance during PV today. Pt mailed instruction packet to included paper to complete and mail back to [facility] with addressed and stamped envelope, Emmi video, copy of consent form to read and not return, and instructions. Clenpiq coupon mailed in packet. PV completed over the phone. Pt encouraged to call with questions or issues. My Chart instructions to pt as well

## 2020-12-11 ENCOUNTER — Emergency Department (HOSPITAL_BASED_OUTPATIENT_CLINIC_OR_DEPARTMENT_OTHER): Payer: BC Managed Care – PPO

## 2020-12-11 ENCOUNTER — Emergency Department (HOSPITAL_BASED_OUTPATIENT_CLINIC_OR_DEPARTMENT_OTHER)
Admission: EM | Admit: 2020-12-11 | Discharge: 2020-12-11 | Disposition: A | Discharge status: Home / Self Care | Payer: BC Managed Care – PPO | Attending: Emergency Medicine | Admitting: Emergency Medicine

## 2020-12-11 ENCOUNTER — Telehealth: Payer: Self-pay | Admitting: Gastroenterology

## 2020-12-11 ENCOUNTER — Encounter (HOSPITAL_BASED_OUTPATIENT_CLINIC_OR_DEPARTMENT_OTHER): Payer: Self-pay

## 2020-12-11 ENCOUNTER — Other Ambulatory Visit: Payer: Self-pay

## 2020-12-11 DIAGNOSIS — Z87891 Personal history of nicotine dependence: Secondary | ICD-10-CM | POA: Insufficient documentation

## 2020-12-11 DIAGNOSIS — D72829 Elevated white blood cell count, unspecified: Secondary | ICD-10-CM | POA: Diagnosis not present

## 2020-12-11 DIAGNOSIS — R109 Unspecified abdominal pain: Secondary | ICD-10-CM | POA: Diagnosis not present

## 2020-12-11 DIAGNOSIS — I2510 Atherosclerotic heart disease of native coronary artery without angina pectoris: Secondary | ICD-10-CM | POA: Diagnosis not present

## 2020-12-11 DIAGNOSIS — Z79899 Other long term (current) drug therapy: Secondary | ICD-10-CM | POA: Insufficient documentation

## 2020-12-11 DIAGNOSIS — I10 Essential (primary) hypertension: Secondary | ICD-10-CM | POA: Diagnosis not present

## 2020-12-11 DIAGNOSIS — K5792 Diverticulitis of intestine, part unspecified, without perforation or abscess without bleeding: Secondary | ICD-10-CM | POA: Diagnosis not present

## 2020-12-11 DIAGNOSIS — K760 Fatty (change of) liver, not elsewhere classified: Secondary | ICD-10-CM | POA: Diagnosis not present

## 2020-12-11 DIAGNOSIS — Z7982 Long term (current) use of aspirin: Secondary | ICD-10-CM | POA: Diagnosis not present

## 2020-12-11 DIAGNOSIS — K5732 Diverticulitis of large intestine without perforation or abscess without bleeding: Secondary | ICD-10-CM | POA: Insufficient documentation

## 2020-12-11 DIAGNOSIS — R1032 Left lower quadrant pain: Secondary | ICD-10-CM | POA: Diagnosis not present

## 2020-12-11 LAB — CBC
HCT: 44.7 % (ref 39.0–52.0)
Hemoglobin: 15.7 g/dL (ref 13.0–17.0)
MCH: 31.9 pg (ref 26.0–34.0)
MCHC: 35.1 g/dL (ref 30.0–36.0)
MCV: 90.9 fL (ref 80.0–100.0)
Platelets: 247 10*3/uL (ref 150–400)
RBC: 4.92 MIL/uL (ref 4.22–5.81)
RDW: 12.7 % (ref 11.5–15.5)
WBC: 12.2 10*3/uL — ABNORMAL HIGH (ref 4.0–10.5)
nRBC: 0 % (ref 0.0–0.2)

## 2020-12-11 LAB — COMPREHENSIVE METABOLIC PANEL
ALT: 19 U/L (ref 0–44)
AST: 15 U/L (ref 15–41)
Albumin: 4 g/dL (ref 3.5–5.0)
Alkaline Phosphatase: 79 U/L (ref 38–126)
Anion gap: 13 (ref 5–15)
BUN: 9 mg/dL (ref 6–20)
CO2: 22 mmol/L (ref 22–32)
Calcium: 9.3 mg/dL (ref 8.9–10.3)
Chloride: 100 mmol/L (ref 98–111)
Creatinine, Ser: 0.86 mg/dL (ref 0.61–1.24)
GFR, Estimated: >60 mL/min (ref >60)
Glucose, Bld: 133 mg/dL — ABNORMAL HIGH (ref 70–99)
Potassium: 3.8 mmol/L (ref 3.5–5.1)
Sodium: 135 mmol/L (ref 135–145)
Total Bilirubin: 1 mg/dL (ref 0.3–1.2)
Total Protein: 7.6 g/dL (ref 6.5–8.1)

## 2020-12-11 LAB — URINALYSIS, ROUTINE W REFLEX MICROSCOPIC
Bilirubin Urine: NEGATIVE
Glucose, UA: NEGATIVE mg/dL
Ketones, ur: NEGATIVE mg/dL
Leukocytes,Ua: NEGATIVE
Nitrite: NEGATIVE
Protein, ur: NEGATIVE mg/dL
Specific Gravity, Urine: 1.025 (ref 1.005–1.030)
pH: 6 (ref 5.0–8.0)

## 2020-12-11 LAB — URINALYSIS, MICROSCOPIC (REFLEX)

## 2020-12-11 LAB — LIPASE, BLOOD: Lipase: 21 U/L (ref 11–51)

## 2020-12-11 MED ORDER — ONDANSETRON 4 MG PO TBDP: 4.0000 mg | ORAL_TABLET | Freq: Three times a day (TID) | ORAL | 0 refills | Status: DC | PRN

## 2020-12-11 MED ORDER — AMOXICILLIN-POT CLAVULANATE 875-125 MG PO TABS: 1.0000 | ORAL_TABLET | Freq: Two times a day (BID) | ORAL | 0 refills | Status: AC

## 2020-12-11 MED ORDER — HYDROCODONE-ACETAMINOPHEN 5-325 MG PO TABS
1.0000 | ORAL_TABLET | Freq: Once | ORAL | Status: AC
  Administered 2020-12-11: 1 via ORAL
  Filled 2020-12-11: qty 1

## 2020-12-11 MED ORDER — GLYCERIN (LAXATIVE) 2.1 G RE SUPP
1.0000 | Freq: Once | RECTAL | Status: AC
  Administered 2020-12-11: 1 via RECTAL
  Filled 2020-12-11: qty 1

## 2020-12-11 MED ORDER — IOHEXOL 350 MG/ML SOLN
100.0000 mL | Freq: Once | INTRAVENOUS | Status: AC | PRN
  Administered 2020-12-11: 85 mL via INTRAVENOUS

## 2020-12-11 NOTE — Telephone Encounter (Signed)
Yes, will need to postpone colonoscopy for >6 weeks. Can reschedule. Thanks

## 2020-12-11 NOTE — Telephone Encounter (Signed)
Dr Barron Alvine-  This pt is scheduled for a colon 9-2 Friday ( this Friday )   He went ot the ED today, dx'd with Diverticulitis- see CT below  Does he need to cancel his colon and RS- and if so , how many weeks do you ant him to wait for his colon?  Thanks   Hilda Lias PV   Result Date: 12/11/2020 CLINICAL DATA:  Left lower quadrant abdominal pain x1 day. EXAM: CT ABDOMEN AND PELVIS WITH CONTRAST TECHNIQUE: Multidetector CT imaging of the abdomen and pelvis was performed using the standard protocol following bolus administration of intravenous contrast. CONTRAST:  36mL OMNIPAQUE IOHEXOL 350 MG/ML SOLN COMPARISON:  None. FINDINGS: Lower chest: No acute abnormality.  Moderate hiatal hernia. Hepatobiliary: Diffuse hepatic steatosis. No suspicious hepatic lesion. Gallbladder is unremarkable. No biliary ductal dilation. Pancreas: Within normal limits. Spleen: Within normal limits. Adrenals/Urinary Tract: Adrenal glands are unremarkable. Kidneys are normal, without renal calculi, solid enhancing lesion, or hydronephrosis. Mild symmetric wall thickening of an incompletely distended urinary bladder. Stomach/Bowel: Moderate hiatal hernia. No pathologic dilation of small or large bowel. Left-sided colonic diverticulosis with 2 inflamed sigmoid colonic diverticula with adjacent wall thickening and inflammatory stranding. No pneumoperitoneum. No walled off fluid collections. Vascular/Lymphatic: No abdominal aortic aneurysm. No pathologically enlarged abdominal or pelvic lymph nodes. Reproductive: Mild prostatic enlargement. Other: Trace free fluid in the left lower quadrant/hemipelvis. No walled off fluid collections. No pneumoperitoneum. Musculoskeletal: Multilevel degenerative changes spine. Degenerative changes bilateral hips. No acute osseous abnormality. IMPRESSION: 1. Acute uncomplicated sigmoid diverticulitis. No evidence of perforation or abscess formation. 2. Moderate hiatal hernia. 3. Diffuse hepatic steatosis.  Electronically Signed   By: Maudry Mayhew M.D.   On: 12/11/2020 11:22

## 2020-12-11 NOTE — ED Provider Notes (Signed)
[facility] EMERGENCY DEPARTMENT Provider Note   CSN: [id number] Arrival date & time: 12/11/20  6568     History Chief Complaint  Patient presents with   Abdominal Pain    Gregg Morgan is a 60 y.o. male.   Abdominal Pain Pain location:  LLQ Pain quality: aching and sharp   Pain radiates to:  Suprapubic region Pain severity:  Mild Onset quality:  Gradual Duration:  2 days Timing:  Intermittent Progression:  Waxing and waning Chronicity:  New Context: laxative use   Context: not diet changes, not recent illness, not sick contacts and not suspicious food intake   Relieved by:  Lying down Worsened by:  Movement and position changes Associated symptoms: anorexia, constipation, flatus and nausea   Associated symptoms: no chest pain, no chills, no cough, no diarrhea, no dysuria, no fatigue, no fever, no hematemesis, no hematochezia, no hematuria, no melena, no shortness of breath, no sore throat and no vomiting       Past Medical History:  Diagnosis Date   Arthritis    right knee   Coronary artery disease    GERD (gastroesophageal reflux disease)    Heart murmur    Hyperlipidemia    Hypertension    Obstructive sleep apnea    wears cpap   Pericarditis    Sleep apnea     Patient Active Problem List   Diagnosis Date Noted   Tobacco use disorder 01/15/2016   Gastroesophageal reflux disease without esophagitis 07/14/2015   Coronary artery disease 06/15/2010   Hyperlipidemia 06/15/2010   Hypertension 06/15/2010   Obstructive sleep apnea on CPAP 06/15/2010    Past Surgical History:  Procedure Laterality Date   CARDIAC CATHETERIZATION     09/2009--DR  BERRY   FINGER SURGERY     ORIF FINGER FRACTURE  08/03/2011   Procedure: OPEN REDUCTION INTERNAL FIXATION (ORIF) METACARPAL (FINGER) FRACTURE;  Surgeon: Linna Hoff, MD;  Location: [facility];  Service: Orthopedics;  Laterality: Right;  RIGHT THUMB ORIF   UPPER GASTROINTESTINAL ENDOSCOPY  2012   [name]       Family History  Problem Relation Age of Onset   Thyroid disease Mother    Hypertension Mother    Colon cancer Father    Pancreatic cancer Neg Hx    Esophageal cancer Neg Hx    Liver cancer Neg Hx    Stomach cancer Neg Hx    Rectal cancer Neg Hx    Colon polyps Neg Hx     Social History   Tobacco Use   Smoking status: Former    Packs/day: 0.50    Years: 20.00    Pack years: 10.00    Types: Cigarettes    Quit date: 03/12/2016    Years since quitting: 4.7   Smokeless tobacco: Never  Substance Use Topics   Alcohol use: Yes    Alcohol/week: 2.0 standard drinks    Types: 2 Cans of beer per week    Comment: Occasion on weekends   Drug use: No    Home Medications Prior to Admission medications   Medication Sig Start Date End Date Taking? Authorizing Provider  amoxicillin-clavulanate (AUGMENTIN) 875-125 MG tablet Take 1 tablet by mouth every 12 (twelve) hours for 5 days. 12/11/20 12/16/20 Yes Regan Lemming, MD  ondansetron (ZOFRAN ODT) 4 MG disintegrating tablet Take 1 tablet (4 mg total) by mouth every 8 (eight) hours as needed for nausea or vomiting. 12/11/20  Yes Regan Lemming, MD  acetaminophen (TYLENOL) 500  MG tablet Take 500 mg by mouth every 6 (six) hours as needed.    [provider]  aspirin 81 MG tablet Take 81 mg by mouth at bedtime.    [provider]  Cholecalciferol (VITAMIN D) 2000 UNITS CAPS Take 1 capsule by mouth at bedtime.    [provider]  fish oil-omega-3 fatty acids 1000 MG capsule Take 1,000 mg by mouth at bedtime. Patient not taking: Reported on 11/30/2020    [provider]  lisinopril (ZESTRIL) 20 MG tablet Take 1 tablet (20 mg total) by mouth daily. 06/22/20   Claretta Fraise, MD  nabumetone (RELAFEN) 500 MG tablet Take 2 tablets (1,000 mg total) by mouth 2 (two) times daily. For muscle and joint pain 06/22/20   Claretta Fraise, MD  pantoprazole (PROTONIX) 40 MG tablet Take 1 tablet (40 mg total) by mouth  2 (two) times daily. 10/30/20   Claretta Fraise, MD  rosuvastatin (CRESTOR) 20 MG tablet Take 1 tablet (20 mg total) by mouth daily. 06/22/20   Claretta Fraise, MD  Sod Picosulfate-Mag Ox-Cit Acd (CLENPIQ) 10-3.5-12 MG-GM -GM/160ML SOLN Take 1 kit by mouth as directed. 11/30/20   Cirigliano, Vito V, DO  tiZANidine (ZANAFLEX) 4 MG capsule TAKE 1 CAPSULE BY MOUTH 3 TIMES DAILY AS NEEDED FOR MUSCLE SPASMS. 11/21/20   Claretta Fraise, MD  Vitamin D, Ergocalciferol, (DRISDOL) 1.25 MG (50000 UNIT) CAPS capsule Take 1 capsule (50,000 Units total) by mouth every 7 (seven) days. 11/21/20   Claretta Fraise, MD    Allergies    Patient has no known allergies.  Review of Systems   Review of Systems  Constitutional:  Negative for chills, fatigue and fever.  HENT:  Negative for ear pain and sore throat.   Eyes:  Negative for pain and visual disturbance.  Respiratory:  Negative for cough and shortness of breath.   Cardiovascular:  Negative for chest pain and palpitations.  Gastrointestinal:  Positive for abdominal pain, anorexia, constipation, flatus and nausea. Negative for diarrhea, hematemesis, hematochezia, melena and vomiting.  Genitourinary:  Negative for dysuria and hematuria.  Musculoskeletal:  Negative for arthralgias and back pain.  Skin:  Negative for color change and rash.  Neurological:  Negative for seizures and syncope.  All other systems reviewed and are negative.  Physical Exam Updated Vital Signs BP 136/85 (BP Location: Right Arm)   Pulse 69   Temp 99.5 F (37.5 C) (Oral)   Resp 18   Ht '5\' 7"'  (1.702 m)   Wt 93 kg   SpO2 99%   BMI 32.11 kg/m   Physical Exam Vitals and nursing note reviewed.  Constitutional:      Appearance: He is well-developed.  HENT:     Head: Normocephalic and atraumatic.  Eyes:     Conjunctiva/sclera: Conjunctivae normal.  Cardiovascular:     Rate and Rhythm: Normal rate and regular rhythm.     Heart sounds: No murmur heard. Pulmonary:     Effort: Pulmonary  effort is normal. No respiratory distress.     Breath sounds: Normal breath sounds.  Abdominal:     Palpations: Abdomen is soft.     Tenderness: There is abdominal tenderness in the left lower quadrant. There is no right CVA tenderness, left CVA tenderness, guarding or rebound.  Musculoskeletal:     Cervical back: Neck supple.  Skin:    General: Skin is warm and dry.  Neurological:     General: No focal deficit present.     Mental Status: He is  alert and oriented to person, place, and time.    ED Results / Procedures / Treatments   Labs (all labs ordered are listed, but only abnormal results are displayed) Labs Reviewed  COMPREHENSIVE METABOLIC PANEL - Abnormal; Notable for the following components:      Result Value   Glucose, Bld 133 (*)    All other components within normal limits  CBC - Abnormal; Notable for the following components:   WBC 12.2 (*)    All other components within normal limits  URINALYSIS, ROUTINE W REFLEX MICROSCOPIC - Abnormal; Notable for the following components:   Hgb urine dipstick SMALL (*)    All other components within normal limits  URINALYSIS, MICROSCOPIC (REFLEX) - Abnormal; Notable for the following components:   Bacteria, UA RARE (*)    All other components within normal limits  LIPASE, BLOOD    EKG None  Radiology CT ABDOMEN PELVIS W CONTRAST  Result Date: 12/11/2020 CLINICAL DATA:  Left lower quadrant abdominal pain x1 day. EXAM: CT ABDOMEN AND PELVIS WITH CONTRAST TECHNIQUE: Multidetector CT imaging of the abdomen and pelvis was performed using the standard protocol following bolus administration of intravenous contrast. CONTRAST:  36m OMNIPAQUE IOHEXOL 350 MG/ML SOLN COMPARISON:  None. FINDINGS: Lower chest: No acute abnormality.  Moderate hiatal hernia. Hepatobiliary: Diffuse hepatic steatosis. No suspicious hepatic lesion. Gallbladder is unremarkable. No biliary ductal dilation. Pancreas: Within normal limits. Spleen: Within normal  limits. Adrenals/Urinary Tract: Adrenal glands are unremarkable. Kidneys are normal, without renal calculi, solid enhancing lesion, or hydronephrosis. Mild symmetric wall thickening of an incompletely distended urinary bladder. Stomach/Bowel: Moderate hiatal hernia. No pathologic dilation of small or large bowel. Left-sided colonic diverticulosis with 2 inflamed sigmoid colonic diverticula with adjacent wall thickening and inflammatory stranding. No pneumoperitoneum. No walled off fluid collections. Vascular/Lymphatic: No abdominal aortic aneurysm. No pathologically enlarged abdominal or pelvic lymph nodes. Reproductive: Mild prostatic enlargement. Other: Trace free fluid in the left lower quadrant/hemipelvis. No walled off fluid collections. No pneumoperitoneum. Musculoskeletal: Multilevel degenerative changes spine. Degenerative changes bilateral hips. No acute osseous abnormality. IMPRESSION: 1. Acute uncomplicated sigmoid diverticulitis. No evidence of perforation or abscess formation. 2. Moderate hiatal hernia. 3. Diffuse hepatic steatosis. Electronically Signed   By: JDahlia BailiffM.D.   On: 12/11/2020 11:22    Procedures Procedures   Medications Ordered in ED Medications  Glycerin (Adult) 2.1 g suppository 1 suppository (1 suppository Rectal Given 12/11/20 1118)  iohexol (OMNIPAQUE) 350 MG/ML injection 100 mL (85 mLs Intravenous Contrast Given 12/11/20 1054)  HYDROcodone-acetaminophen (NORCO/VICODIN) 5-325 MG per tablet 1 tablet (1 tablet Oral Given 12/11/20 1140)    ED Course  I have reviewed the triage vital signs and the nursing notes.  Pertinent labs & imaging results that were available during my care of the patient were reviewed by me and considered in my medical decision making (see chart for details).    MDM Rules/Calculators/A&P                           KGarris MelhornBullins is a 60y.o. male who presented to the Emergency Department c/o LLQ abdominal pain.  Patient states that he  has had left lower quadrant abdominal pain that started yesterday morning.  The pain is sharp and aching and will occasionally radiate across his lower abdomen.  His last bowel movement was a few days ago.  He tried a home laxative without success.  He is passing gas as of this morning.  Past medical records have been reviewed and are notable for CAD, HLD, HTN, obstructive sleep apnea, GERD  Pertinent exam findings include: Tenderness to palpation of the left lower quadrant without rebound or guarding.   Thought process: 49-year-old male presenting to the emergency department with left lower quadrant abdominal pain, anorexia and nausea concerning for possible diverticulosis/diverticulitis.  No fever chills or systemic signs of infection.  He is passing gas with lower concern for small bowel obstruction.  Considered constipation.   Differential diagnosis: Bowel Obstruction, AAA, ACS, pneumonia, pneumothorax, Pyelonephritis, Nephrolithiasis, Pancreatitis, Cholecystitis, Shingles, Perforated Bowel or Ulcer, Diverticulosis/itis, Ischemic Mesentery, Inflammatory Bowel Disease, Strangulated/Incarcerated Hernia, gastritis, PUD. Patient without peritoneal signs or other indication of need for surgical intervention.  Lab results include: Mild leukocytosis to 12 0.2, no anemia or platelet abnormality, CMP with mild hyperglycemia 133, otherwise unremarkable, lipase normal, urinalysis without evidence of UTI.  Imaging results include: CT of the abdomen pelvis with contrast concerning for acute uncomplicated diverticulitis.  Course of tx has consisted of: Norco, glycerin suppository.  The patient's presentation is consistent with acute uncomplicated sigmoid diverticulitis.  We will attempt to manage conservatively outpatient with a short course of Augmentin and Zofran for nausea.  Patient was advised to follow-up with his gastroenterologist as he was originally scheduled for colonoscopy this Friday.  Return  precautions provided in the event of worsening of symptoms.  Final Clinical Impression(s) / ED Diagnoses Final diagnoses:  Diverticulitis    Rx / DC Orders ED Discharge Orders          Ordered    amoxicillin-clavulanate (AUGMENTIN) 875-125 MG tablet  Every 12 hours        12/11/20 1134    ondansetron (ZOFRAN ODT) 4 MG disintegrating tablet  Every 8 hours PRN        12/11/20 1135             Regan Lemming, MD 12/11/20 1215

## 2020-12-11 NOTE — ED Notes (Signed)
Patient Alert and oriented to baseline. Stable and ambulatory to baseline. Patient verbalized understanding of the discharge instructions.  Patient belongings were taken by the patient.   

## 2020-12-11 NOTE — Telephone Encounter (Signed)
Inbound call from patient wife. Have endo/colon procedure 9/2. Patient was in ED 8/29 and diagnosed with diverticulitis. Patient want to know if it is okay to proceed with procedure while on the antibiotics. Best contact number [phone]

## 2020-12-11 NOTE — ED Triage Notes (Signed)
Pt c/o LLQ abdominal pain starting yesterday morning. Last normal Bm a few days ago. Denies N/V or urinary symptoms. Worse with palpation.

## 2020-12-11 NOTE — Telephone Encounter (Signed)
pt has diverticulitis dx'd 8-29- per VC- next colon > 6 weeks - wife michelle aware and she will need to CB to RS colon on a Friday Mr Eyman is off

## 2020-12-15 ENCOUNTER — Encounter: Payer: BC Managed Care – PPO | Admitting: Gastroenterology

## 2020-12-25 ENCOUNTER — Encounter: Payer: BC Managed Care – PPO | Admitting: Family Medicine

## 2021-01-07 ENCOUNTER — Other Ambulatory Visit: Payer: Self-pay | Admitting: Family Medicine

## 2021-01-07 DIAGNOSIS — I10 Essential (primary) hypertension: Secondary | ICD-10-CM

## 2021-01-18 DIAGNOSIS — R011 Cardiac murmur, unspecified: Secondary | ICD-10-CM | POA: Insufficient documentation

## 2021-01-18 NOTE — Progress Notes (Signed)
Cardiology Office Note   Date:  01/19/2021   ID:  Gregg Morgan, DOB 05-21-1960, MRN [id number]  PCP:  Claretta Fraise, MD  Cardiologist:   None Referring:  Claretta Fraise, MD  Chief Complaint  Patient presents with   Abnormal ECG   Heart Murmur       History of Present Illness: Gregg Morgan is a 60 y.o. male who presents for evaluation of a murmur.  He is referred by Claretta Fraise, MD    he had a history of hepatitis in the past.  He was seen at that time and I do see a cardiac catheterization in 2011.  His LAD had mid 50 to 60% stenosis.  This was after the first diagonal.  The right coronary artery had 30 to 40% stenosis.  He was recently found to have a murmur when he was going for GI evaluation.  He is never been told about having a heart murmur before.  He is active.  He works at an active job and he denies any cardiovascular symptoms.  The patient denies any new symptoms such as chest discomfort, neck or arm discomfort. There has been no new shortness of breath, PND or orthopnea. There have been no reported palpitations, presyncope or syncope.  Of note he does have a left bundle branch block on his EKG.  This was not present at the time of his catheterization or distant EKGs.  Past Medical History:  Diagnosis Date   Arthritis    right knee   Coronary artery disease    GERD (gastroesophageal reflux disease)    Heart murmur    Hyperlipidemia    Hypertension    Obstructive sleep apnea    wears cpap   Pericarditis    Sleep apnea     Past Surgical History:  Procedure Laterality Date   CARDIAC CATHETERIZATION     09/2009--DR  BERRY   FINGER SURGERY     ORIF FINGER FRACTURE  08/03/2011   Procedure: OPEN REDUCTION INTERNAL FIXATION (ORIF) METACARPAL (FINGER) FRACTURE;  Surgeon: Linna Hoff, MD;  Location: [facility];  Service: Orthopedics;  Laterality: Right;  RIGHT THUMB ORIF   UPPER GASTROINTESTINAL ENDOSCOPY  2012   [name]     Current  Outpatient Medications  Medication Sig Dispense Refill   acetaminophen (TYLENOL) 500 MG tablet Take 500 mg by mouth every 6 (six) hours as needed.     aspirin 81 MG tablet Take 81 mg by mouth at bedtime.     lisinopril (ZESTRIL) 20 MG tablet TAKE 1 TABLET BY MOUTH EVERY DAY 90 tablet 1   nabumetone (RELAFEN) 500 MG tablet TAKE 2 TABLETS (1,000 MG TOTAL) BY MOUTH 2 (TWO) TIMES DAILY. FOR MUSCLE AND JOINT PAIN 360 tablet 1   pantoprazole (PROTONIX) 40 MG tablet TAKE 1 TABLET BY MOUTH EVERY DAY 90 tablet 1   rosuvastatin (CRESTOR) 20 MG tablet TAKE 1 TABLET BY MOUTH EVERY DAY 90 tablet 1   Sod Picosulfate-Mag Ox-Cit Acd (CLENPIQ) 10-3.5-12 MG-GM -GM/160ML SOLN Take 1 kit by mouth as directed. (Patient taking differently: Take 1 kit by mouth as directed. On hold until) 320 mL 0   tiZANidine (ZANAFLEX) 4 MG capsule TAKE 1 CAPSULE BY MOUTH 3 TIMES DAILY AS NEEDED FOR MUSCLE SPASMS. 90 capsule 5   Cholecalciferol (VITAMIN D) 2000 UNITS CAPS Take 1 capsule by mouth at bedtime. (Patient not taking: Reported on 01/19/2021)     fish oil-omega-3 fatty acids 1000 MG capsule Take 1,000  mg by mouth at bedtime. (Patient not taking: No sig reported)     ondansetron (ZOFRAN ODT) 4 MG disintegrating tablet Take 1 tablet (4 mg total) by mouth every 8 (eight) hours as needed for nausea or vomiting. (Patient not taking: Reported on 01/19/2021) 20 tablet 0   Vitamin D, Ergocalciferol, (DRISDOL) 1.25 MG (50000 UNIT) CAPS capsule Take 1 capsule (50,000 Units total) by mouth every 7 (seven) days. (Patient not taking: Reported on 01/19/2021) 13 capsule 3   No current facility-administered medications for this visit.    Allergies:   Patient has no known allergies.    Social History:  The patient  reports that he quit smoking about 4 years ago. His smoking use included cigarettes. He has a 10.00 pack-year smoking history. He has never used smokeless tobacco. He reports current alcohol use of about 2.0 standard drinks per week.  He reports that he does not use drugs.   Family History:  The patient's family history includes Colon cancer in his father; Hypertension in his mother; Thyroid disease in his mother.    ROS:  Please see the history of present illness.   Otherwise, review of systems are positive for recent episode of diverticulitis.   All other systems are reviewed and negative.    PHYSICAL EXAM: VS:  BP 120/80 (BP Location: Right Arm)   Pulse 62   Ht _0  (1.702 m)   Wt 197 lb 6.4 oz (89.5 kg)   SpO2 94%   BMI 30.92 kg/m  , BMI Body mass index is 30.92 kg/m. GENERAL:  Well appearing HEENT:  Pupils equal round and reactive, fundi not visualized, oral mucosa unremarkable NECK:  No jugular venous distention, waveform within normal limits, carotid upstroke brisk and symmetric, no bruits, no thyromegaly LYMPHATICS:  No cervical, inguinal adenopathy LUNGS:  Clear to auscultation bilaterally BACK:  No CVA tenderness CHEST:  Unremarkable HEART:  PMI not displaced or sustained,S1 and S2 within normal limits, no S3, no S4, no clicks, no rubs, 2 out of 6 apical systolic murmur not holosystolic and not changing with the strain phase of Valsalva, no diastolic murmurs ABD:  Flat, positive bowel sounds normal in frequency in pitch, no bruits, no rebound, no guarding, no midline pulsatile mass, no hepatomegaly, no splenomegaly EXT:  2 plus pulses throughout, no edema, no cyanosis no clubbing SKIN:  No rashes no nodules NEURO:  Cranial nerves II through XII grossly intact, motor grossly intact throughout PSYCH:  Cognitively intact, oriented to person place and time    EKG:  EKG is ordered today. The ekg ordered today demonstrates sinus rhythm, rate 62, left bundle branch block   Recent Labs: 12/11/2020: ALT 19; BUN 9; Creatinine, Ser 0.86; Hemoglobin 15.7; Platelets 247; Potassium 3.8; Sodium 135    Lipid Panel    Component Value Date/Time   CHOL 194 11/17/2020 1025   TRIG 136 11/17/2020 1025   HDL 52  11/17/2020 1025   CHOLHDL 3.7 11/17/2020 1025   CHOLHDL 5.2 10/09/2009 1325   VLDL 16 10/09/2009 1325   LDLCALC 118 (H) 11/17/2020 1025      Wt Readings from Last 3 Encounters:  01/19/21 197 lb 6.4 oz (89.5 kg)  12/11/20 205 lb (93 kg)  11/30/20 205 lb (93 kg)      Other studies Reviewed: Additional studies/ records that were reviewed today include: Catheterization, previous EKGs. Review of the above records demonstrates:  Please see elsewhere in the note.     ASSESSMENT AND PLAN:  Murmur:  The murmur sounds like mild aortic stenosis rather than mitral regurgitation.  I am going to check an echocardiogram to evaluate.  Further evaluation will be based on this.  Dyslipidemia: His LDL is not at target 118.  I am going to go ahead and increase his Crestor to 40 mg daily and get a repeat lipid profile with an A1c in about 10 weeks.  HTN: His blood pressure is controlled.  Continue the meds as listed.  CAD: He does have coronary disease but he is not having any symptoms related to this.  I will consider further ischemia evaluation in the future based on the results of the echocardiogram.  However, for now the attention will be primary risk reduction.   Current medicines are reviewed at length with the patient today.  The patient does not have concerns regarding medicines.  The following changes have been made:  no change  Labs/ tests ordered today include:  No orders of the defined types were placed in this encounter.    Disposition:   FU with me in 3 months in [place], Minus Breeding, MD  01/19/2021 11:20 AM    [place]

## 2021-01-19 ENCOUNTER — Encounter: Payer: Self-pay | Admitting: Cardiology

## 2021-01-19 ENCOUNTER — Ambulatory Visit: Payer: BC Managed Care – PPO | Admitting: Cardiology

## 2021-01-19 ENCOUNTER — Other Ambulatory Visit: Payer: Self-pay

## 2021-01-19 VITALS — BP 120/80 | HR 62 | Ht 67.0 in | Wt 197.4 lb

## 2021-01-19 DIAGNOSIS — R011 Cardiac murmur, unspecified: Secondary | ICD-10-CM | POA: Diagnosis not present

## 2021-01-19 DIAGNOSIS — R739 Hyperglycemia, unspecified: Secondary | ICD-10-CM

## 2021-01-19 DIAGNOSIS — Z5181 Encounter for therapeutic drug level monitoring: Secondary | ICD-10-CM | POA: Diagnosis not present

## 2021-01-19 DIAGNOSIS — E785 Hyperlipidemia, unspecified: Secondary | ICD-10-CM

## 2021-01-19 MED ORDER — ROSUVASTATIN CALCIUM 40 MG PO TABS: 40.0000 mg | ORAL_TABLET | Freq: Every day | ORAL | 3 refills | Status: DC

## 2021-01-19 NOTE — Patient Instructions (Signed)
Medication Instructions:  INCREASE YOUR ROSUVASTATIN TO 40 MG DAILY   *If you need a refill on your cardiac medications before your next appointment, please call your pharmacy*  Lab Work: FASTING LP/HFP/A1C IN 10 WEEKS  If you have labs (blood work) drawn today and your tests are completely normal, you will receive your results only by: MyChart Message (if you have MyChart) OR A paper copy in the mail If you have any lab test that is abnormal or we need to change your treatment, we will call you to review the results.  Testing/Procedures: Your physician has requested that you have an echocardiogram. Echocardiography is a painless test that uses sound waves to create images of your heart. It provides your doctor with information about the size and shape of your heart and how well your heart's chambers and valves are working. This procedure takes approximately one hour. There are no restrictions for this procedure.   Follow-Up: At [facility], you and your health needs are our priority.  As part of our continuing mission to provide you with exceptional heart care, we have created designated Provider Care Teams.  These Care Teams include your primary Cardiologist (physician) and Advanced Practice Providers (APPs -  Physician Assistants and Nurse Practitioners) who all work together to provide you with the care you need, when you need it.  We recommend signing up for the patient portal called "MyChart".  Sign up information is provided on this After Visit Summary.  MyChart is used to connect with patients for Virtual Visits (Telemedicine).  Patients are able to view lab/test results, encounter notes, upcoming appointments, etc.  Non-urgent messages can be sent to your provider as well.   To learn more about what you can do with MyChart, go to [email or web address].    Your next appointment:   3 month(s)  The format for your next appointment:   In Person  Provider:   DR [facility] [place]  [place]

## 2021-01-22 ENCOUNTER — Ambulatory Visit: Payer: BC Managed Care – PPO | Admitting: Family Medicine

## 2021-01-22 ENCOUNTER — Other Ambulatory Visit: Payer: Self-pay

## 2021-01-22 ENCOUNTER — Ambulatory Visit (INDEPENDENT_AMBULATORY_CARE_PROVIDER_SITE_OTHER): Payer: BC Managed Care – PPO

## 2021-01-22 ENCOUNTER — Encounter: Payer: Self-pay | Admitting: Family Medicine

## 2021-01-22 VITALS — BP 137/76 | HR 68 | Temp 97.8°F | Ht 67.0 in | Wt 199.5 lb

## 2021-01-22 DIAGNOSIS — M545 Low back pain, unspecified: Secondary | ICD-10-CM | POA: Diagnosis not present

## 2021-01-22 DIAGNOSIS — M5441 Lumbago with sciatica, right side: Secondary | ICD-10-CM

## 2021-01-22 MED ORDER — METHYLPREDNISOLONE ACETATE 80 MG/ML IJ SUSP
80.0000 mg | Freq: Once | INTRAMUSCULAR | Status: AC
  Administered 2021-01-22: 80 mg via INTRAMUSCULAR

## 2021-01-22 MED ORDER — METHOCARBAMOL 500 MG PO TABS: 500.0000 mg | ORAL_TABLET | Freq: Four times a day (QID) | ORAL | 1 refills | Status: DC

## 2021-01-22 NOTE — Progress Notes (Signed)
Acute Office Visit  Subjective:    Patient ID: Gregg Morgan, male    DOB: May 02, 1960, 60 y.o.   MRN: [id number]  Chief Complaint  Patient presents with   Back Pain     HPI Patient is in today for back pain that has been ongoing for a few months. The pain worsened yesterday after bending over. It felt like a catch. The pain is on the right lower side of his back, almost into his buttocks. The pain radiates around his right hip and down his right anterior leg. The pain is worse in the morning. The pain is worse with going from sitting to standing or when first starting to walk. The pain improves with movement. The pain was a 10/10 this morning. The pain is now a 5/10 at rest. The pain is a dull ache at rest. He denies numbness, tingling, or changes in bowel or bladder control. Denies injury. He has been taking tizanidine and using heat without improvement.   Past Medical History:  Diagnosis Date   Arthritis    right knee   Coronary artery disease    GERD (gastroesophageal reflux disease)    Heart murmur    Hyperlipidemia    Hypertension    Obstructive sleep apnea    wears cpap   Pericarditis    Sleep apnea     Past Surgical History:  Procedure Laterality Date   CARDIAC CATHETERIZATION     09/2009--DR  BERRY   FINGER SURGERY     ORIF FINGER FRACTURE  08/03/2011   Procedure: OPEN REDUCTION INTERNAL FIXATION (ORIF) METACARPAL (FINGER) FRACTURE;  Surgeon: Linna Hoff, MD;  Location: [facility];  Service: Orthopedics;  Laterality: Right;  RIGHT THUMB ORIF   UPPER GASTROINTESTINAL ENDOSCOPY  2012   [name]    Family History  Problem Relation Age of Onset   Thyroid disease Mother    Hypertension Mother    Colon cancer Father    Pancreatic cancer Neg Hx    Esophageal cancer Neg Hx    Liver cancer Neg Hx    Stomach cancer Neg Hx    Rectal cancer Neg Hx    Colon polyps Neg Hx     Social History   Socioeconomic History   Marital status: Married    Spouse name:  Not on file   Number of children: 1   Years of education: 12   Highest education level: Not on file  Occupational History   Occupation: Glass blower/designer    Employer: [employer]   Occupation: Librarian, academic - Theatre stage manager  Tobacco Use   Smoking status: Former    Packs/day: 0.50    Years: 20.00    Pack years: 10.00    Types: Cigarettes    Quit date: 03/12/2016    Years since quitting: 4.8   Smokeless tobacco: Never  Vaping Use   Vaping Use: Not on file  Substance and Sexual Activity   Alcohol use: Yes    Alcohol/week: 2.0 standard drinks    Types: 2 Cans of beer per week    Comment: Occasion on weekends   Drug use: No   Sexual activity: Not on file  Other Topics Concern   Not on file  Social History Narrative   Married, lives with wife and daughter.  One child.  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    Social Determinants of Health   Financial Resource Strain: Not on file  Food Insecurity: Not on file  Transportation Needs: Not on  file  Physical Activity: Not on file  Stress: Not on file  Social Connections: Not on file  Intimate Partner Violence: Not on file    Outpatient Medications Prior to Visit  Medication Sig Dispense Refill   acetaminophen (TYLENOL) 500 MG tablet Take 500 mg by mouth every 6 (six) hours as needed.     aspirin 81 MG tablet Take 81 mg by mouth at bedtime.     Cholecalciferol (VITAMIN D) 2000 UNITS CAPS Take 1 capsule by mouth at bedtime.     fish oil-omega-3 fatty acids 1000 MG capsule Take 1,000 mg by mouth at bedtime.     lisinopril (ZESTRIL) 20 MG tablet TAKE 1 TABLET BY MOUTH EVERY DAY 90 tablet 1   nabumetone (RELAFEN) 500 MG tablet TAKE 2 TABLETS (1,000 MG TOTAL) BY MOUTH 2 (TWO) TIMES DAILY. FOR MUSCLE AND JOINT PAIN 360 tablet 1   pantoprazole (PROTONIX) 40 MG tablet TAKE 1 TABLET BY MOUTH EVERY DAY 90 tablet 1   rosuvastatin (CRESTOR) 40 MG tablet Take 1 tablet (40 mg total) by mouth daily. 90 tablet 3   tiZANidine (ZANAFLEX) 4 MG capsule TAKE 1 CAPSULE  BY MOUTH 3 TIMES DAILY AS NEEDED FOR MUSCLE SPASMS. 90 capsule 5   Vitamin D, Ergocalciferol, (DRISDOL) 1.25 MG (50000 UNIT) CAPS capsule Take 1 capsule (50,000 Units total) by mouth every 7 (seven) days. 13 capsule 3   Sod Picosulfate-Mag Ox-Cit Acd (CLENPIQ) 10-3.5-12 MG-GM -GM/160ML SOLN Take 1 kit by mouth as directed. (Patient not taking: Reported on 01/22/2021) 320 mL 0   ondansetron (ZOFRAN ODT) 4 MG disintegrating tablet Take 1 tablet (4 mg total) by mouth every 8 (eight) hours as needed for nausea or vomiting. 20 tablet 0   No facility-administered medications prior to visit.    No Known Allergies  Review of Systems As per HPI.     Objective:    Physical Exam Vitals and nursing note reviewed.  Constitutional:      General: He is not in acute distress.    Appearance: He is not ill-appearing, toxic-appearing or diaphoretic.  Pulmonary:     Effort: Pulmonary effort is normal. No respiratory distress.  Musculoskeletal:     Lumbar back: Tenderness (Right gluteal) present. No swelling, edema, deformity, signs of trauma or bony tenderness. Positive right straight leg raise test.     Right lower leg: No edema.     Left lower leg: No edema.  Skin:    General: Skin is warm and dry.  Neurological:     General: No focal deficit present.     Mental Status: He is alert and oriented to person, place, and time.  Psychiatric:        Mood and Affect: Mood normal.        Behavior: Behavior normal.    BP 137/76   Pulse 68   Temp 97.8 F (36.6 C) (Temporal)   Ht _0  (1.702 m)   Wt 199 lb 8 oz (90.5 kg)   BMI 31.25 kg/m  Wt Readings from Last 3 Encounters:  01/22/21 199 lb 8 oz (90.5 kg)  01/19/21 197 lb 6.4 oz (89.5 kg)  12/11/20 205 lb (93 kg)    Health Maintenance Due  Topic Date Due   COLONOSCOPY (Pts 45-54yr Insurance coverage will need to be confirmed)  Never done   COVID-19 Vaccine (3 - Booster for Moderna series) 06/07/2020   INFLUENZA VACCINE  11/13/2020   Zoster  Vaccines- Shingrix (2 of 2) 01/12/2021  There are no preventive care reminders to display for this patient.   Lab Results  Component Value Date   TSH 2.210 11/29/2014   Lab Results  Component Value Date   WBC 12.2 (H) 12/11/2020   HGB 15.7 12/11/2020   HCT 44.7 12/11/2020   MCV 90.9 12/11/2020   PLT 247 12/11/2020   Lab Results  Component Value Date   NA 135 12/11/2020   K 3.8 12/11/2020   CO2 22 12/11/2020   GLUCOSE 133 (H) 12/11/2020   BUN 9 12/11/2020   CREATININE 0.86 12/11/2020   BILITOT 1.0 12/11/2020   ALKPHOS 79 12/11/2020   AST 15 12/11/2020   ALT 19 12/11/2020   PROT 7.6 12/11/2020   ALBUMIN 4.0 12/11/2020   CALCIUM 9.3 12/11/2020   ANIONGAP 13 12/11/2020   EGFR 93 11/17/2020   Lab Results  Component Value Date   CHOL 194 11/17/2020   Lab Results  Component Value Date   HDL 52 11/17/2020   Lab Results  Component Value Date   LDLCALC 118 (H) 11/17/2020   Lab Results  Component Value Date   TRIG 136 11/17/2020   Lab Results  Component Value Date   CHOLHDL 3.7 11/17/2020   Lab Results  Component Value Date   HGBA1C  10/09/2009    5.6 (NOTE)                                                                       According to the ADA Clinical Practice Recommendations for 2011, when HbA1c is used as a screening test:   >=6.5%   Diagnostic of Diabetes Mellitus           (if abnormal result  is confirmed)  5.7-6.4%   Increased risk of developing Diabetes Mellitus  References:Diagnosis and Classification of Diabetes Mellitus,Diabetes WLNL,8921,19(ERDEY 1):S62-S69 and Standards of Medical Care in         Diabetes - 2011,Diabetes Care,2011,34  (Suppl 1):S11-S61.       Assessment & Plan:   Gregg Morgan was seen today for back pain.  Diagnoses and all orders for this visit:  Acute right-sided low back pain with right-sided sciatica Xray today in office, radiology report pending. Try robaxin instead on tizanidine. Steroid IM injection today in office.  Discussed heat, ice, voltaren gel, stretching. Discussed PT if no improvement.  -     DG Lumbar Spine 2-3 Views; Future -     methocarbamol (ROBAXIN) 500 MG tablet; Take 1 tablet (500 mg total) by mouth 4 (four) times daily. -     methylPREDNISolone acetate (DEPO-MEDROL) injection 80 mg    Return to office for new or worsening symptoms, or if symptoms persist.   The patient indicates understanding of these issues and agrees with the plan.  Gwenlyn Perking, FNP

## 2021-01-22 NOTE — Patient Instructions (Signed)
Sciatica Sciatica is pain, numbness, weakness, or tingling along the path of the sciatic nerve. The sciatic nerve starts in the lower back and runs down the back of each leg. The nerve controls the muscles in the lower leg and in the back of the knee. It also provides feeling (sensation) to the back of the thigh, the lower leg, and the sole of the foot. Sciatica is a symptom of another medical condition that pinches or puts pressure on the sciatic nerve. Sciatica most often only affects one side of the body. Sciatica usually goes away on its own or with treatment. In some cases, sciatica may come back (recur). What are the causes? This condition is caused by pressure on the sciatic nerve or pinching of the nerve. This may be the result of: A disk in between the bones of the spine bulging out too far (herniated disk). Age-related changes in the spinal disks. A pain disorder that affects a muscle in the buttock. Extra bone growth near the sciatic nerve. A break (fracture) of the pelvis. Pregnancy. Tumor. This is rare. What increases the risk? The following factors may make you more likely to develop this condition: Playing sports that place pressure or stress on the spine. Having poor strength and flexibility. A history of back injury or surgery. Sitting for long periods of time. Doing activities that involve repetitive bending or lifting. Obesity. What are the signs or symptoms? Symptoms can vary from mild to very severe, and they may include: Any of these problems in the lower back, leg, hip, or buttock: Mild tingling, numbness, or dull aches. Burning sensations. Sharp pains. Numbness in the back of the calf or the sole of the foot. Leg weakness. Severe back pain that makes movement difficult. Symptoms may get worse when you cough, sneeze, or laugh, or when you sit or stand for long periods of time. How is this diagnosed? This condition may be diagnosed based on: Your symptoms and  medical history. A physical exam. Blood tests. Imaging tests, such as: X-rays. MRI. CT scan. How is this treated? In many cases, this condition improves on its own without treatment. However, treatment may include: Reducing or modifying physical activity. Exercising and stretching. Icing and applying heat to the affected area. Medicines that help to: Relieve pain and swelling. Relax your muscles. Injections of medicines that help to relieve pain, irritation, and inflammation around the sciatic nerve (steroids). Surgery. Follow these instructions at home: Medicines Take over-the-counter and prescription medicines only as told by your health care provider. Ask your health care provider if the medicine prescribed to you: Requires you to avoid driving or using heavy machinery. Can cause constipation. You may need to take these actions to prevent or treat constipation: Drink enough fluid to keep your urine pale yellow. Take over-the-counter or prescription medicines. Eat foods that are high in fiber, such as beans, whole grains, and fresh fruits and vegetables. Limit foods that are high in fat and processed sugars, such as fried or sweet foods. Managing pain   If directed, put ice on the affected area. Put ice in a plastic bag. Place a towel between your skin and the bag. Leave the ice on for 20 minutes, 2-3 times a day. If directed, apply heat to the affected area. Use the heat source that your health care provider recommends, such as a moist heat pack or a heating pad. Place a towel between your skin and the heat source. Leave the heat on for 20-30 minutes. Remove   the heat if your skin turns bright red. This is especially important if you are unable to feel pain, heat, or cold. You may have a greater risk of getting burned. Activity  Return to your normal activities as told by your health care provider. Ask your health care provider what activities are safe for you. Avoid  activities that make your symptoms worse. Take brief periods of rest throughout the day. When you rest for longer periods, mix in some mild activity or stretching between periods of rest. This will help to prevent stiffness and pain. Avoid sitting for long periods of time without moving. Get up and move around at least one time each hour. Exercise and stretch regularly, as told by your health care provider. Do not lift anything that is heavier than 10 lb (4.5 kg) while you have symptoms of sciatica. When you do not have symptoms, you should still avoid heavy lifting, especially repetitive heavy lifting. When you lift objects, always use proper lifting technique, which includes: Bending your knees. Keeping the load close to your body. Avoiding twisting. General instructions Maintain a healthy weight. Excess weight puts extra stress on your back. Wear supportive, comfortable shoes. Avoid wearing high heels. Avoid sleeping on a mattress that is too soft or too hard. A mattress that is firm enough to support your back when you sleep may help to reduce your pain. Keep all follow-up visits as told by your health care provider. This is important. Contact a health care provider if: You have pain that: Wakes you up when you are sleeping. Gets worse when you lie down. Is worse than you have experienced in the past. Lasts longer than 4 weeks. You have an unexplained weight loss. Get help right away if: You are not able to control when you urinate or have bowel movements (incontinence). You have: Weakness in your lower back, pelvis, buttocks, or legs that gets worse. Redness or swelling of your back. A burning sensation when you urinate. Summary Sciatica is pain, numbness, weakness, or tingling along the path of the sciatic nerve. This condition is caused by pressure on the sciatic nerve or pinching of the nerve. Sciatica can cause pain, numbness, or tingling in the lower back, legs, hips, and  buttocks. Treatment often includes rest, exercise, medicines, and applying ice or heat. This information is not intended to replace advice given to you by your health care provider. Make sure you discuss any questions you have with your health care provider. Document Revised: 04/20/2018 Document Reviewed: 04/20/2018 Elsevier Patient Education  2022 Elsevier Inc.  

## 2021-01-24 ENCOUNTER — Other Ambulatory Visit: Payer: Self-pay | Admitting: Family Medicine

## 2021-01-24 ENCOUNTER — Telehealth: Payer: Self-pay | Admitting: *Deleted

## 2021-01-24 DIAGNOSIS — M5441 Lumbago with sciatica, right side: Secondary | ICD-10-CM

## 2021-01-24 MED ORDER — PREDNISONE 20 MG PO TABS: 40.0000 mg | ORAL_TABLET | Freq: Every day | ORAL | 0 refills | Status: AC

## 2021-01-24 NOTE — Telephone Encounter (Signed)
Lmtcb regarding xray, will close this telephone encounter since pt has a result note regarding this.

## 2021-01-24 NOTE — Telephone Encounter (Signed)
Pt's wife called to see what the xray showed from Monday afternoon because the pt isn't doing any better and is actually worse. He has missed work and can't even bend over. Advised pt's wife we don't have report back yet but our xray tech is calling radiology to see if they can read the report.

## 2021-01-31 ENCOUNTER — Ambulatory Visit (INDEPENDENT_AMBULATORY_CARE_PROVIDER_SITE_OTHER): Payer: BC Managed Care – PPO

## 2021-01-31 ENCOUNTER — Other Ambulatory Visit: Payer: Self-pay

## 2021-01-31 DIAGNOSIS — R011 Cardiac murmur, unspecified: Secondary | ICD-10-CM

## 2021-01-31 LAB — ECHOCARDIOGRAM COMPLETE
AR max vel: 2.08 cm2
AV Area VTI: 2.23 cm2
AV Area mean vel: 2.14 cm2
AV Mean grad: 6 mmHg
AV Peak grad: 13.4 mmHg
Ao pk vel: 1.83 m/s
Area-P 1/2: 3.15 cm2
S' Lateral: 2.94 cm

## 2021-02-01 ENCOUNTER — Telehealth: Payer: Self-pay | Admitting: Cardiology

## 2021-02-01 DIAGNOSIS — I422 Other hypertrophic cardiomyopathy: Secondary | ICD-10-CM

## 2021-02-01 DIAGNOSIS — Z01812 Encounter for preprocedural laboratory examination: Secondary | ICD-10-CM

## 2021-02-01 NOTE — Telephone Encounter (Signed)
Pt's wife is calling regarding tests results from Echo on 01/31/21

## 2021-02-01 NOTE — Telephone Encounter (Signed)
Results pending MD review - routed to Dr. Benjamin Stain RN

## 2021-02-02 NOTE — Telephone Encounter (Signed)
Advised wife, ok per DPR   Order for MRI placed and message to scheduling to arrange

## 2021-02-02 NOTE — Telephone Encounter (Signed)
-----   Message from Rollene Rotunda, MD sent at 02/01/2021  5:45 PM EDT ----- There might be some mild aortic sclerosis that explains his murmur but its not severe stenosis.  There is a thickened heart muscle that could be causing the murmur and would be managed medically but to further evaluate this he needs a cardiac MRI.  Please call with results and schedule the MRI.  I will see him after this but we will call with the results of the MRI when available.   Send results to Mechele Claude, MD

## 2021-02-28 ENCOUNTER — Other Ambulatory Visit: Payer: BC Managed Care – PPO

## 2021-02-28 ENCOUNTER — Other Ambulatory Visit: Payer: Self-pay

## 2021-02-28 DIAGNOSIS — Z01812 Encounter for preprocedural laboratory examination: Secondary | ICD-10-CM | POA: Diagnosis not present

## 2021-03-01 LAB — CBC WITH DIFFERENTIAL/PLATELET
Basophils Absolute: 0 10*3/uL (ref 0.0–0.2)
Basos: 1 %
EOS (ABSOLUTE): 0.1 10*3/uL (ref 0.0–0.4)
Eos: 1 %
Hematocrit: 45.9 % (ref 37.5–51.0)
Hemoglobin: 15.5 g/dL (ref 13.0–17.7)
Immature Grans (Abs): 0 10*3/uL (ref 0.0–0.1)
Immature Granulocytes: 0 %
Lymphocytes Absolute: 2.2 10*3/uL (ref 0.7–3.1)
Lymphs: 40 %
MCH: 31.2 pg (ref 26.6–33.0)
MCHC: 33.8 g/dL (ref 31.5–35.7)
MCV: 92 fL (ref 79–97)
Monocytes Absolute: 0.5 10*3/uL (ref 0.1–0.9)
Monocytes: 9 %
Neutrophils Absolute: 2.8 10*3/uL (ref 1.4–7.0)
Neutrophils: 49 %
Platelets: 247 10*3/uL (ref 150–450)
RBC: 4.97 x10E6/uL (ref 4.14–5.80)
RDW: 12.6 % (ref 11.6–15.4)
WBC: 5.7 10*3/uL (ref 3.4–10.8)

## 2021-03-01 LAB — BASIC METABOLIC PANEL
BUN/Creatinine Ratio: 13 (ref 10–24)
BUN: 10 mg/dL (ref 8–27)
CO2: 21 mmol/L (ref 20–29)
Calcium: 9.5 mg/dL (ref 8.6–10.2)
Chloride: 104 mmol/L (ref 96–106)
Creatinine, Ser: 0.79 mg/dL (ref 0.76–1.27)
Glucose: 100 mg/dL — ABNORMAL HIGH (ref 70–99)
Potassium: 4.3 mmol/L (ref 3.5–5.2)
Sodium: 142 mmol/L (ref 134–144)
eGFR: 102 mL/min/{1.73_m2} (ref >59)

## 2021-03-01 NOTE — Progress Notes (Signed)
Hello [place],  Your lab result is normal and/or stable.Some minor variations that are not significant are commonly marked abnormal, but do not represent any medical problem for you.  Best regards, Saraiya Kozma, M.D.

## 2021-03-06 ENCOUNTER — Telehealth (HOSPITAL_COMMUNITY): Payer: Self-pay | Admitting: Emergency Medicine

## 2021-03-06 NOTE — Telephone Encounter (Signed)
Reaching out to patient to offer assistance regarding upcoming cardiac imaging study; pt verbalizes understanding of appt date/time, parking situation and where to check in, and verified current allergies; name and call back number provided for further questions should they arise Rockwell Alexandria RN Navigator Cardiac Imaging [facility] [phone] office [phone] cell  Spoke to wife- states no metal, no claustro, no iv issues

## 2021-03-07 ENCOUNTER — Ambulatory Visit (HOSPITAL_COMMUNITY)
Admission: RE | Admit: 2021-03-07 | Discharge: 2021-03-07 | Disposition: A | Discharge status: Home / Self Care | Payer: BC Managed Care – PPO | Source: Ambulatory Visit | Attending: Cardiology | Admitting: Cardiology

## 2021-03-07 ENCOUNTER — Other Ambulatory Visit: Payer: Self-pay

## 2021-03-07 DIAGNOSIS — Z01812 Encounter for preprocedural laboratory examination: Secondary | ICD-10-CM | POA: Insufficient documentation

## 2021-03-07 DIAGNOSIS — I422 Other hypertrophic cardiomyopathy: Secondary | ICD-10-CM

## 2021-03-07 MED ORDER — GADOBUTROL 1 MMOL/ML IV SOLN
10.0000 mL | Freq: Once | INTRAVENOUS | Status: AC | PRN
  Administered 2021-03-07: 10 mL via INTRAVENOUS

## 2021-03-13 ENCOUNTER — Telehealth: Payer: Self-pay | Admitting: Cardiology

## 2021-03-13 NOTE — Telephone Encounter (Signed)
Pt and wife requesting results from recent cardiac MRI done on 03/07/21.

## 2021-03-13 NOTE — Telephone Encounter (Signed)
Wife of the patient called. The patient has not gotten the results from Dr. Antoine Poche or his Nurse yet. Please advise

## 2021-03-14 ENCOUNTER — Encounter: Payer: Self-pay | Admitting: *Deleted

## 2021-03-14 ENCOUNTER — Telehealth: Payer: Self-pay | Admitting: *Deleted

## 2021-03-14 ENCOUNTER — Ambulatory Visit (INDEPENDENT_AMBULATORY_CARE_PROVIDER_SITE_OTHER): Payer: BC Managed Care – PPO

## 2021-03-14 DIAGNOSIS — I422 Other hypertrophic cardiomyopathy: Secondary | ICD-10-CM

## 2021-03-14 NOTE — Telephone Encounter (Signed)
-----   Message from Rollene Rotunda, MD sent at 03/13/2021  7:47 PM EST ----- I gave the results to his wife after checking that she was listed.  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  No high risk features or findings but given this I do need a three day Zio patch to screen for high risk arrhythmias.

## 2021-03-14 NOTE — Progress Notes (Unsigned)
Enrolled patient for a 3 day Zio XT monitor to be mailed to patients home  

## 2021-03-19 DIAGNOSIS — I422 Other hypertrophic cardiomyopathy: Secondary | ICD-10-CM | POA: Diagnosis not present

## 2021-04-17 DIAGNOSIS — I422 Other hypertrophic cardiomyopathy: Secondary | ICD-10-CM | POA: Insufficient documentation

## 2021-04-17 NOTE — Progress Notes (Signed)
Cardiology Office Note   Date:  04/18/2021   ID:  Gregg Morgan, DOB 09-18-1960, MRN [id number]  PCP:  Claretta Fraise, MD  Cardiologist:   None Referring:  Claretta Fraise, MD  Chief Complaint  Patient presents with   Coronary Artery Disease       History of Present Illness: Gregg Morgan is a 61 y.o. male who presents for evaluation of a murmur.  He is referred by Claretta Fraise, MD    he had a history of hepatitis in the past.  He was seen at that time and I do see a cardiac catheterization in 2011.  His LAD had mid 50 to 60% stenosis.  This was after the first diagonal.  The right coronary artery had 30 to 40% stenosis.  He had a heart murmur and was found to have HCM on echo.  MRI demonstrated ASH with 17 mm septum.  There was only patchy LGE.  He had no arrhythmias on a 3 day monitor.    Since I last saw him he has done well.  The patient denies any new symptoms such as chest discomfort, neck or arm discomfort. There has been no new shortness of breath, PND or orthopnea. There have been no reported palpitations, presyncope or syncope.   Past Medical History:  Diagnosis Date   Arthritis    right knee   Coronary artery disease    GERD (gastroesophageal reflux disease)    Heart murmur    Hyperlipidemia    Hypertension    Obstructive sleep apnea    wears cpap   Pericarditis    Sleep apnea     Past Surgical History:  Procedure Laterality Date   CARDIAC CATHETERIZATION     09/2009--DR  BERRY   FINGER SURGERY     ORIF FINGER FRACTURE  08/03/2011   Procedure: OPEN REDUCTION INTERNAL FIXATION (ORIF) METACARPAL (FINGER) FRACTURE;  Surgeon: Linna Hoff, MD;  Location: [facility];  Service: Orthopedics;  Laterality: Right;  RIGHT THUMB ORIF   UPPER GASTROINTESTINAL ENDOSCOPY  2012   [name]     Current Outpatient Medications  Medication Sig Dispense Refill   acetaminophen (TYLENOL) 500 MG tablet Take 500 mg by mouth every 6 (six) hours as needed.      aspirin 81 MG tablet Take 81 mg by mouth at bedtime.     Cholecalciferol (VITAMIN D) 2000 UNITS CAPS Take 1 capsule by mouth at bedtime.     fish oil-omega-3 fatty acids 1000 MG capsule Take 1,000 mg by mouth at bedtime.     lisinopril (ZESTRIL) 20 MG tablet TAKE 1 TABLET BY MOUTH EVERY DAY 90 tablet 1   methocarbamol (ROBAXIN) 500 MG tablet Take 1 tablet (500 mg total) by mouth 4 (four) times daily. 60 tablet 1   nabumetone (RELAFEN) 500 MG tablet TAKE 2 TABLETS (1,000 MG TOTAL) BY MOUTH 2 (TWO) TIMES DAILY. FOR MUSCLE AND JOINT PAIN 360 tablet 1   pantoprazole (PROTONIX) 40 MG tablet TAKE 1 TABLET BY MOUTH EVERY DAY 90 tablet 1   rosuvastatin (CRESTOR) 40 MG tablet Take 1 tablet (40 mg total) by mouth daily. 90 tablet 3   Sod Picosulfate-Mag Ox-Cit Acd (CLENPIQ) 10-3.5-12 MG-GM -GM/160ML SOLN Take 1 kit by mouth as directed. 320 mL 0   Vitamin D, Ergocalciferol, (DRISDOL) 1.25 MG (50000 UNIT) CAPS capsule Take 1 capsule (50,000 Units total) by mouth every 7 (seven) days. 13 capsule 3   No current facility-administered medications for this visit.  Allergies:   Patient has no known allergies.    ROS:  Please see the history of present illness.   Otherwise, review of systems are positive for none .   All other systems are reviewed and negative.    PHYSICAL EXAM: VS:  BP (!) 142/86    Pulse 69    Ht '5\' 7"'  (1.702 m)    Wt 198 lb (89.8 kg)    SpO2 96%    BMI 31.01 kg/m  , BMI Body mass index is 31.01 kg/m. GENERAL:  Well appearing NECK:  No jugular venous distention, waveform within normal limits, carotid upstroke brisk and symmetric, no bruits, no thyromegaly LUNGS:  Clear to auscultation bilaterally CHEST:  Unremarkable HEART:  PMI not displaced or sustained,S1 and S2 within normal limits, no S3, no S4, no clicks, no rubs, 2 out of 6 apical systolic murmur not appreciably changing with the strain phase of Valsalva, no diastolic murmurs ABD:  Flat, positive bowel sounds normal in  frequency in pitch, no bruits, no rebound, no guarding, no midline pulsatile mass, no hepatomegaly, no splenomegaly EXT:  2 plus pulses throughout, no edema, no cyanosis no clubbing   EKG:  EKG is not ordered today   Recent Labs: 12/11/2020: ALT 19 02/28/2021: BUN 10; Creatinine, Ser 0.79; Hemoglobin 15.5; Platelets 247; Potassium 4.3; Sodium 142    Lipid Panel    Component Value Date/Time   CHOL 194 11/17/2020 1025   TRIG 136 11/17/2020 1025   HDL 52 11/17/2020 1025   CHOLHDL 3.7 11/17/2020 1025   CHOLHDL 5.2 10/09/2009 1325   VLDL 16 10/09/2009 1325   LDLCALC 118 (H) 11/17/2020 1025      Wt Readings from Last 3 Encounters:  04/18/21 198 lb (89.8 kg)  01/22/21 199 lb 8 oz (90.5 kg)  01/19/21 197 lb 6.4 oz (89.5 kg)      Other studies Reviewed: Additional studies/ records that were reviewed today include: Echo, monitor and MRI.  Review of the above records demonstrates:  Please see elsewhere in the note.     ASSESSMENT AND PLAN:  HCM:    We had a long discussion about this and physiology.  He had no high risk findings.  He went for genetic testing because of cost and his daughter is only 69 or so we will consider genetic testing in the future and further screening of her when she is a little older.   Dyslipidemia: His LDL is I did increase his Crestor and he will come back for fasting lipid profile.   HTN: His blood pressure is controlled.  No change in therapy.   CAD: He has no ongoing symptoms and we will continue with aggressive risk reduction.    Current medicines are reviewed at length with the patient today.  The patient does not have concerns regarding medicines.  The following changes have been made:  None  Labs/ tests ordered today include:  None  Orders Placed This Encounter  Procedures   Lipid panel      Disposition:   FU with me in 12 months in [place], Minus Breeding, MD  04/18/2021 4:40 PM    [place]

## 2021-04-18 ENCOUNTER — Encounter: Payer: Self-pay | Admitting: Cardiology

## 2021-04-18 ENCOUNTER — Ambulatory Visit (INDEPENDENT_AMBULATORY_CARE_PROVIDER_SITE_OTHER): Payer: BC Managed Care – PPO | Admitting: Cardiology

## 2021-04-18 ENCOUNTER — Other Ambulatory Visit: Payer: Self-pay | Admitting: *Deleted

## 2021-04-18 VITALS — BP 142/86 | HR 69 | Ht 67.0 in | Wt 198.0 lb

## 2021-04-18 DIAGNOSIS — E785 Hyperlipidemia, unspecified: Secondary | ICD-10-CM

## 2021-04-18 DIAGNOSIS — I2510 Atherosclerotic heart disease of native coronary artery without angina pectoris: Secondary | ICD-10-CM | POA: Diagnosis not present

## 2021-04-18 DIAGNOSIS — I422 Other hypertrophic cardiomyopathy: Secondary | ICD-10-CM | POA: Diagnosis not present

## 2021-04-18 DIAGNOSIS — I10 Essential (primary) hypertension: Secondary | ICD-10-CM

## 2021-04-18 NOTE — Patient Instructions (Signed)
Medication Instructions:  The current medical regimen is effective;  continue present plan and medications.  *If you need a refill on your cardiac medications before your next appointment, please call your pharmacy*  Lab Work: Please have fasting lab work (Lipid) at [facility].  If you have labs (blood work) drawn today and your tests are completely normal, you will receive your results only by: MyChart Message (if you have MyChart) OR A paper copy in the mail If you have any lab test that is abnormal or we need to change your treatment, we will call you to review the results.  Follow-Up: At [facility], you and your health needs are our priority.  As part of our continuing mission to provide you with exceptional heart care, we have created designated Provider Care Teams.  These Care Teams include your primary Cardiologist (physician) and Advanced Practice Providers (APPs -  Physician Assistants and Nurse Practitioners) who all work together to provide you with the care you need, when you need it.  We recommend signing up for the patient portal called "MyChart".  Sign up information is provided on this After Visit Summary.  MyChart is used to connect with patients for Virtual Visits (Telemedicine).  Patients are able to view lab/test results, encounter notes, upcoming appointments, etc.  Non-urgent messages can be sent to your provider as well.   To learn more about what you can do with MyChart, go to [email or web address].    Your next appointment:   1 year(s)  The format for your next appointment:   In Person  Provider:   Rollene Rotunda, MD    Thank you for choosing [facility]!!

## 2021-04-23 ENCOUNTER — Encounter: Payer: Self-pay | Admitting: *Deleted

## 2021-05-02 ENCOUNTER — Ambulatory Visit: Payer: BC Managed Care – PPO | Admitting: Cardiovascular Disease

## 2021-05-16 NOTE — Progress Notes (Deleted)
Cardiology Clinic Note   Patient Name: Gregg Morgan [facility] Date of Encounter: 05/16/2021  Primary Care Provider:  Claretta Fraise, MD Primary Cardiologist:  Minus Breeding, MD  Patient Profile    Gregg Morgan 61 year old male presents the clinic today for follow-up evaluation of his hypertrophic cardiomyopathy  Past Medical History    Past Medical History:  Diagnosis Date   Arthritis    right knee   Coronary artery disease    GERD (gastroesophageal reflux disease)    Heart murmur    Hyperlipidemia    Hypertension    Obstructive sleep apnea    wears cpap   Pericarditis    Sleep apnea    Past Surgical History:  Procedure Laterality Date   CARDIAC CATHETERIZATION     09/2009--DR  BERRY   FINGER SURGERY     ORIF FINGER FRACTURE  08/03/2011   Procedure: OPEN REDUCTION INTERNAL FIXATION (ORIF) METACARPAL (FINGER) FRACTURE;  Surgeon: Linna Hoff, MD;  Location: [facility];  Service: Orthopedics;  Laterality: Right;  RIGHT THUMB ORIF   UPPER GASTROINTESTINAL ENDOSCOPY  2012   [name]    Allergies  No Known Allergies  History of Present Illness    [name] has a PMH of hypertrophic cardiomyopathy, HLD, HTN, and coronary artery disease.  He last presented for cardiology evaluation due to cardiac murmur.  He was referred by his PCP.  His PMH also includes hepatitis.  He underwent cardiac catheterization in 2011.  His catheterization showed mid LAD stenosis 50-60%, RCA 30-40%.  He was noted to have a heart murmur and his echo showed HCM.  Cardiac MRI showed ASH with 17 mm septum.  He was noted to have only patchy LGE.  He wore a cardiac event monitor which showed no arrhythmias over 3 days.  He had genetic testing however, his daughter did not due to only being 86, but is considering screening when she is older.  He was seen by Dr. Percival Spanish 04/18/2021.  During that time he continues to do well.  He denied chest discomfort, arm neck or back discomfort.  He denied  shortness of breath PND and orthopnea.  He denied palpitations presyncope and syncope.  He presents to the clinic today for follow-up evaluation states***  *** denies chest pain, shortness of breath, lower extremity edema, fatigue, palpitations, melena, hematuria, hemoptysis, diaphoresis, weakness, presyncope, syncope, orthopnea, and PND.   Home Medications    Prior to Admission medications   Medication Sig Start Date End Date Taking? Authorizing Provider  acetaminophen (TYLENOL) 500 MG tablet Take 500 mg by mouth every 6 (six) hours as needed.    [provider]  aspirin 81 MG tablet Take 81 mg by mouth at bedtime.    [provider]  Cholecalciferol (VITAMIN D) 2000 UNITS CAPS Take 1 capsule by mouth at bedtime.    [provider]  fish oil-omega-3 fatty acids 1000 MG capsule Take 1,000 mg by mouth at bedtime.    [provider]  lisinopril (ZESTRIL) 20 MG tablet TAKE 1 TABLET BY MOUTH EVERY DAY 01/08/21   Claretta Fraise, MD  methocarbamol (ROBAXIN) 500 MG tablet Take 1 tablet (500 mg total) by mouth 4 (four) times daily. 01/22/21   Gwenlyn Perking, FNP  nabumetone (RELAFEN) 500 MG tablet TAKE 2 TABLETS (1,000 MG TOTAL) BY MOUTH 2 (TWO) TIMES DAILY. FOR MUSCLE AND JOINT PAIN 01/08/21   Claretta Fraise, MD  pantoprazole (PROTONIX) 40 MG tablet TAKE 1 TABLET BY MOUTH EVERY  DAY 01/08/21   Claretta Fraise, MD  rosuvastatin (CRESTOR) 40 MG tablet Take 1 tablet (40 mg total) by mouth daily. 01/19/21   Minus Breeding, MD  Sod Picosulfate-Mag Ox-Cit Acd (CLENPIQ) 10-3.5-12 MG-GM -GM/160ML SOLN Take 1 kit by mouth as directed. 11/30/20   Cirigliano, Vito V, DO  Vitamin D, Ergocalciferol, (DRISDOL) 1.25 MG (50000 UNIT) CAPS capsule Take 1 capsule (50,000 Units total) by mouth every 7 (seven) days. 11/21/20   Claretta Fraise, MD    Family History    Family History  Problem Relation Age of Onset   Thyroid disease Mother    Hypertension Mother    Colon cancer Father     Pancreatic cancer Neg Hx    Esophageal cancer Neg Hx    Liver cancer Neg Hx    Stomach cancer Neg Hx    Rectal cancer Neg Hx    Colon polyps Neg Hx    He indicated that his mother is alive. He indicated that his father is deceased. He indicated that the status of his neg hx is unknown.  Social History    Social History   Socioeconomic History   Marital status: Married    Spouse name: Not on file   Number of children: 1   Years of education: 12   Highest education level: Not on file  Occupational History   Occupation: Glass blower/designer    Employer: [employer]   Occupation: Librarian, academic - Theatre stage manager  Tobacco Use   Smoking status: Former    Packs/day: 0.50    Years: 20.00    Pack years: 10.00    Types: Cigarettes    Quit date: 03/12/2016    Years since quitting: 5.1   Smokeless tobacco: Never  Vaping Use   Vaping Use: Not on file  Substance and Sexual Activity   Alcohol use: Yes    Alcohol/week: 2.0 standard drinks    Types: 2 Cans of beer per week    Comment: Occasion on weekends   Drug use: No   Sexual activity: Not on file  Other Topics Concern   Not on file  Social History Narrative   Married, lives with wife and daughter.  One child.  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    Social Determinants of Health   Financial Resource Strain: Not on file  Food Insecurity: Not on file  Transportation Needs: Not on file  Physical Activity: Not on file  Stress: Not on file  Social Connections: Not on file  Intimate Partner Violence: Not on file     Review of Systems    General:  No chills, fever, night sweats or weight changes.  Cardiovascular:  No chest pain, dyspnea on exertion, edema, orthopnea, palpitations, paroxysmal nocturnal dyspnea. Dermatological: No rash, lesions/masses Respiratory: No cough, dyspnea Urologic: No hematuria, dysuria Abdominal:   No nausea, vomiting, diarrhea, bright red blood per rectum, melena, or hematemesis Neurologic:  No visual changes, wkns,  changes in mental status. All other systems reviewed and are otherwise negative except as noted above.  Physical Exam    VS:  There were no vitals taken for this visit. , BMI There is no height or weight on file to calculate BMI. GEN: Well nourished, well developed, in no acute distress. HEENT: normal. Neck: Supple, no JVD, carotid bruits, or masses. Cardiac: RRR, no murmurs, rubs, or gallops. No clubbing, cyanosis, edema.  Radials/DP/PT 2+ and equal bilaterally.  Respiratory:  Respirations regular and unlabored, clear to auscultation bilaterally. GI: Soft, nontender, nondistended, BS +  x 4. MS: no deformity or atrophy. Skin: warm and dry, no rash. Neuro:  Strength and sensation are intact. Psych: Normal affect.  Accessory Clinical Findings    Recent Labs: 12/11/2020: ALT 19 02/28/2021: BUN 10; Creatinine, Ser 0.79; Hemoglobin 15.5; Platelets 247; Potassium 4.3; Sodium 142   Recent Lipid Panel    Component Value Date/Time   CHOL 194 11/17/2020 1025   TRIG 136 11/17/2020 1025   HDL 52 11/17/2020 1025   CHOLHDL 3.7 11/17/2020 1025   CHOLHDL 5.2 10/09/2009 1325   VLDL 16 10/09/2009 1325   LDLCALC 118 (H) 11/17/2020 1025    ECG personally reviewed by me today- *** - No acute changes  Echocardiogram 01/31/2021 IMPRESSIONS     1. There is a dynamic LVOT obstruction with peak gradient at rest 69mHg  that does not change significantly with Valsalva. There is evidence of  chordal SAM. Findings suggest possible HOCM. Consider cMRI for further  evaluation      Left ventricular ejection fraction, by estimation, is 55 to 60%. The  left ventricle has normal function. Left ventricular endocardial border  not optimally defined to evaluate regional wall motion. There is moderate  left ventricular hypertrophy of the   basal-septal segment. Left ventricular diastolic parameters are  consistent with Grade I diastolic dysfunction (impaired relaxation).   2. Right ventricular systolic  function is normal. The right ventricular  size is normal. Tricuspid regurgitation signal is inadequate for assessing  PA pressure.   3. The mitral valve is normal in structure. No evidence of mitral valve  regurgitation. No evidence of mitral stenosis.   4. The aortic valve is tricuspid. Aortic valve regurgitation is not  visualized. Mild aortic valve sclerosis is present, with no evidence of  aortic valve stenosis.   5. Aortic dilatation noted. There is mild dilatation of the aortic root,  measuring 40 mm. There is borderline dilatation of the ascending aorta,  measuring 37 mm.   6. The inferior vena cava is normal in size with <50% respiratory  variability, suggesting right atrial pressure of 8 mmHg.  Assessment & Plan   1.  Hypertrophic cardiomyopathy-no increased DOE or activity intolerance.  Genetic testing of family members reviewed. Continue lisinopril Heart healthy low-sodium diet-salty 6 given Increase physical activity as tolerated  Coronary artery disease-denies recent episodes of arm neck back or chest discomfort Continue lisinopril, aspirin, omega-3 fatty acids, rosuvastatin Heart healthy low-sodium diet-salty 6 given Increase physical activity as tolerated  Essential hypertension-BP today***.  Well-controlled at home. Continue current medication regimen Maintain blood pressure log  Hyperlipidemia-reports compliance with rosuvastatin. rosuvastatin increased during previous visit.  11/17/2020: Cholesterol, Total 194; HDL 52; LDL Chol Calc (NIH) 118; Triglycerides 136 Continue rosuvastatin, omega-3 fatty acids, aspirin Repeat fasting lipids and LFTs  Disposition: Follow-up with Dr. HPercival Spanishor me in 4 to 6 months.  JJossie Ng Navpreet Szczygiel NP-C    05/16/2021, 1:56 PM [facility] [address] Office [phone] [phone] Notice: This dictation was prepared with Dragon dictation along with smaller phrase technology. Any  transcriptional errors that result from this process are unintentional and may not be corrected upon review.  I spent***minutes examining this patient, reviewing medications, and using patient centered shared decision making involving her cardiac care.  Prior to her visit I spent greater than 20 minutes reviewing her past medical history,  medications, and prior cardiac tests.

## 2021-05-17 ENCOUNTER — Ambulatory Visit: Payer: BC Managed Care – PPO | Admitting: General Practice

## 2021-05-21 ENCOUNTER — Encounter: Payer: Self-pay | Admitting: Family Medicine

## 2021-05-21 ENCOUNTER — Ambulatory Visit: Payer: BC Managed Care – PPO | Admitting: Family Medicine

## 2021-05-21 VITALS — BP 130/80 | HR 70 | Temp 97.8°F | Ht 67.0 in | Wt 196.0 lb

## 2021-05-21 DIAGNOSIS — I10 Essential (primary) hypertension: Secondary | ICD-10-CM | POA: Diagnosis not present

## 2021-05-21 DIAGNOSIS — Z23 Encounter for immunization: Secondary | ICD-10-CM | POA: Diagnosis not present

## 2021-05-21 DIAGNOSIS — E782 Mixed hyperlipidemia: Secondary | ICD-10-CM

## 2021-05-21 DIAGNOSIS — K219 Gastro-esophageal reflux disease without esophagitis: Secondary | ICD-10-CM

## 2021-05-21 DIAGNOSIS — M1711 Unilateral primary osteoarthritis, right knee: Secondary | ICD-10-CM | POA: Diagnosis not present

## 2021-05-21 DIAGNOSIS — G8929 Other chronic pain: Secondary | ICD-10-CM | POA: Insufficient documentation

## 2021-05-21 DIAGNOSIS — M545 Low back pain, unspecified: Secondary | ICD-10-CM

## 2021-05-21 LAB — CMP14+EGFR
ALT: 28 IU/L (ref 0–44)
AST: 26 IU/L (ref 0–40)
Albumin/Globulin Ratio: 2 (ref 1.2–2.2)
Albumin: 4.9 g/dL — ABNORMAL HIGH (ref 3.8–4.8)
Alkaline Phosphatase: 108 IU/L (ref 44–121)
BUN/Creatinine Ratio: 10 (ref 10–24)
BUN: 10 mg/dL (ref 8–27)
Bilirubin Total: 0.5 mg/dL (ref 0.0–1.2)
CO2: 25 mmol/L (ref 20–29)
Calcium: 10.1 mg/dL (ref 8.6–10.2)
Chloride: 101 mmol/L (ref 96–106)
Creatinine, Ser: 0.97 mg/dL (ref 0.76–1.27)
Globulin, Total: 2.5 g/dL (ref 1.5–4.5)
Glucose: 115 mg/dL — ABNORMAL HIGH (ref 70–99)
Potassium: 4.5 mmol/L (ref 3.5–5.2)
Sodium: 140 mmol/L (ref 134–144)
Total Protein: 7.4 g/dL (ref 6.0–8.5)
eGFR: 89 mL/min/{1.73_m2} (ref >59)

## 2021-05-21 LAB — CBC WITH DIFFERENTIAL/PLATELET
Basophils Absolute: 0.1 10*3/uL (ref 0.0–0.2)
Basos: 2 %
EOS (ABSOLUTE): 0.1 10*3/uL (ref 0.0–0.4)
Eos: 2 %
Hematocrit: 49.7 % (ref 37.5–51.0)
Hemoglobin: 16.4 g/dL (ref 13.0–17.7)
Immature Grans (Abs): 0 10*3/uL (ref 0.0–0.1)
Immature Granulocytes: 0 %
Lymphocytes Absolute: 1.7 10*3/uL (ref 0.7–3.1)
Lymphs: 29 %
MCH: 29.9 pg (ref 26.6–33.0)
MCHC: 33 g/dL (ref 31.5–35.7)
MCV: 91 fL (ref 79–97)
Monocytes Absolute: 0.5 10*3/uL (ref 0.1–0.9)
Monocytes: 8 %
Neutrophils Absolute: 3.5 10*3/uL (ref 1.4–7.0)
Neutrophils: 59 %
Platelets: 250 10*3/uL (ref 150–450)
RBC: 5.48 x10E6/uL (ref 4.14–5.80)
RDW: 12.8 % (ref 11.6–15.4)
WBC: 5.9 10*3/uL (ref 3.4–10.8)

## 2021-05-21 LAB — LIPID PANEL
Chol/HDL Ratio: 3.2 ratio (ref 0.0–5.0)
Cholesterol, Total: 188 mg/dL (ref 100–199)
HDL: 58 mg/dL (ref >39)
LDL Chol Calc (NIH): 82 mg/dL (ref 0–99)
Triglycerides: 298 mg/dL — ABNORMAL HIGH (ref 0–149)
VLDL Cholesterol Cal: 48 mg/dL — ABNORMAL HIGH (ref 5–40)

## 2021-05-21 MED ORDER — NABUMETONE 500 MG PO TABS: 1000.0000 mg | ORAL_TABLET | Freq: Two times a day (BID) | ORAL | 2 refills | Status: DC

## 2021-05-21 MED ORDER — PANTOPRAZOLE SODIUM 40 MG PO TBEC: 40.0000 mg | DELAYED_RELEASE_TABLET | Freq: Every day | ORAL | 2 refills | Status: DC

## 2021-05-21 MED ORDER — LISINOPRIL 20 MG PO TABS: 20.0000 mg | ORAL_TABLET | Freq: Every day | ORAL | 2 refills | Status: DC

## 2021-05-21 NOTE — Progress Notes (Signed)
Subjective:  Patient ID: Gregg Morgan, male    DOB: 16-Aug-1960  Age: 61 y.o. MRN: [id number]  CC: Medical Management of Chronic Issues   HPI Gregg Morgan presents for follow up on heart. Dr. Percival Spanish increased the Crestor to High dose. Work up did not present any high risk factors.  Patient in for follow-up of GERD. Currently asymptomatic taking  PPI daily. There is no chest pain or heartburn. No hematemesis and no melena. No dysphagia or choking. Onset is remote. Progression is stable. Complicating factors, none.   in for follow-up of elevated cholesterol. Doing well without complaints on current medication. Denies side effects of statin including myalgia and arthralgia and nausea. Currently no chest pain, shortness of breath or other cardiovascular related symptoms noted.   Depression screen [facility] 2/9 05/21/2021 01/22/2021 11/17/2020  Decreased Interest 0 0 0  Down, Depressed, Hopeless 0 0 0  PHQ - 2 Score 0 0 0  Altered sleeping - 0 -  Tired, decreased energy - 0 -  Change in appetite - 0 -  Feeling bad or failure about yourself  - 0 -  Trouble concentrating - 0 -  Moving slowly or fidgety/restless - 0 -  Suicidal thoughts - 0 -  PHQ-9 Score - 0 -  Difficult doing work/chores - Not difficult at all -    History Gregg Morgan has a past medical history of Arthritis, Coronary artery disease, GERD (gastroesophageal reflux disease), Heart murmur, Hyperlipidemia, Hypertension, Obstructive sleep apnea, Pericarditis, and Sleep apnea.   He has a past surgical history that includes Finger surgery; Cardiac catheterization; ORIF finger fracture (08/03/2011); and Upper gastrointestinal endoscopy (2012).   His family history includes Colon cancer in his father; Hypertension in his mother; Thyroid disease in his mother.He reports that he quit smoking about 5 years ago. His smoking use included cigarettes. He has a 10.00 pack-year smoking history. He has never used smokeless tobacco. He  reports current alcohol use of about 2.0 standard drinks per week. He reports that he does not use drugs.    ROS Review of Systems  Constitutional: Negative.  Negative for fever.  HENT: Negative.    Eyes:  Negative for visual disturbance.  Respiratory:  Negative for cough and shortness of breath.   Cardiovascular:  Negative for chest pain and leg swelling.  Gastrointestinal:  Negative for abdominal pain, diarrhea, nausea and vomiting.  Genitourinary:  Negative for difficulty urinating.  Musculoskeletal:  Negative for arthralgias and myalgias.  Skin:  Negative for rash.  Neurological:  Negative for headaches.  Psychiatric/Behavioral:  Negative for sleep disturbance.    Objective:  BP 130/80    Pulse 70    Temp 97.8 F (36.6 C)    Ht _0  (1.702 m)    Wt 196 lb (88.9 kg)    SpO2 97%    BMI 30.70 kg/m   BP Readings from Last 3 Encounters:  05/21/21 130/80  04/18/21 (!) 142/86  01/22/21 137/76    Wt Readings from Last 3 Encounters:  05/21/21 196 lb (88.9 kg)  04/18/21 198 lb (89.8 kg)  01/22/21 199 lb 8 oz (90.5 kg)     Physical Exam Constitutional:      General: He is not in acute distress.    Appearance: He is well-developed.  HENT:     Head: Normocephalic and atraumatic.     Right Ear: External ear normal.     Left Ear: External ear normal.     Nose: Nose normal.  Eyes:  Conjunctiva/sclera: Conjunctivae normal.     Pupils: Pupils are equal, round, and reactive to light.  Cardiovascular:     Rate and Rhythm: Normal rate and regular rhythm.     Heart sounds: Normal heart sounds. No murmur heard. Pulmonary:     Effort: Pulmonary effort is normal. No respiratory distress.     Breath sounds: Normal breath sounds. No wheezing or rales.  Abdominal:     Palpations: Abdomen is soft.     Tenderness: There is no abdominal tenderness.  Musculoskeletal:        General: Normal range of motion.     Cervical back: Normal range of motion and neck supple.  Skin:     General: Skin is warm and dry.  Neurological:     Mental Status: He is alert and oriented to person, place, and time.     Deep Tendon Reflexes: Reflexes are normal and symmetric.  Psychiatric:        Behavior: Behavior normal.        Thought Content: Thought content normal.        Judgment: Judgment normal.      Assessment & Plan:   Gregg Morgan was seen today for medical management of chronic issues.  Diagnoses and all orders for this visit:  Primary hypertension -     CBC with Differential/Platelet -     CMP14+EGFR  Mixed hyperlipidemia -     Lipid panel  Essential hypertension -     lisinopril (ZESTRIL) 20 MG tablet; Take 1 tablet (20 mg total) by mouth daily.  Need for shingles vaccine -     Varicella-zoster vaccine IM (Shingrix)  Primary osteoarthritis of right knee -     Ambulatory referral to Orthopedics  Chronic right-sided low back pain without sciatica -     Ambulatory referral to Orthopedics  Gastroesophageal reflux disease without esophagitis  Other orders -     pantoprazole (PROTONIX) 40 MG tablet; Take 1 tablet (40 mg total) by mouth daily. -     nabumetone (RELAFEN) 500 MG tablet; Take 2 tablets (1,000 mg total) by mouth 2 (two) times daily. For muscle and joint pain       I have changed Gregg Morgan's lisinopril and pantoprazole. I am also having him maintain his aspirin, fish oil-omega-3 fatty acids, Vitamin D, Vitamin D (Ergocalciferol), acetaminophen, Clenpiq, rosuvastatin, methocarbamol, and nabumetone.  Allergies as of 05/21/2021   No Known Allergies      Medication List        Accurate as of May 21, 2021  8:58 AM. If you have any questions, ask your nurse or doctor.          acetaminophen 500 MG tablet Commonly known as: TYLENOL Take 500 mg by mouth every 6 (six) hours as needed.   aspirin 81 MG tablet Take 81 mg by mouth at bedtime.   Clenpiq 10-3.5-12 MG-GM -GM/160ML Soln Generic drug: Sod Picosulfate-Mag Ox-Cit Acd Take  1 kit by mouth as directed.   fish oil-omega-3 fatty acids 1000 MG capsule Take 1,000 mg by mouth at bedtime.   lisinopril 20 MG tablet Commonly known as: ZESTRIL Take 1 tablet (20 mg total) by mouth daily.   methocarbamol 500 MG tablet Commonly known as: Robaxin Take 1 tablet (500 mg total) by mouth 4 (four) times daily.   nabumetone 500 MG tablet Commonly known as: RELAFEN Take 2 tablets (1,000 mg total) by mouth 2 (two) times daily. For muscle and joint pain   pantoprazole 40  MG tablet Commonly known as: PROTONIX Take 1 tablet (40 mg total) by mouth daily.   rosuvastatin 40 MG tablet Commonly known as: CRESTOR Take 1 tablet (40 mg total) by mouth daily.   Vitamin D (Ergocalciferol) 1.25 MG (50000 UNIT) Caps capsule Commonly known as: DRISDOL Take 1 capsule (50,000 Units total) by mouth every 7 (seven) days.   Vitamin D 50 MCG (2000 UT) Caps Take 1 capsule by mouth at bedtime.         Follow-up: Return in about 6 months (around 11/18/2021) for [facility] physical.  Claretta Fraise, M.D.

## 2021-06-01 ENCOUNTER — Telehealth: Payer: Self-pay | Admitting: Cardiology

## 2021-06-01 NOTE — Telephone Encounter (Signed)
New Message:    Wife wants to know if Dr Antoine Poche received patient's lab results from [facility] which he had about 2 weeks ago. Wife said she received a call yesterday, saying patient's Triglyceride was high.

## 2021-06-01 NOTE — Telephone Encounter (Signed)
The patient's wife has been made aware that the results are in epic. Message sent to Dr. Antoine Poche for review.

## 2021-06-04 NOTE — Telephone Encounter (Signed)
The patient's wife has been made aware, per dpr  Rollene Rotunda, MD  You Yesterday (10:55 AM)   Lipids are better.  He can continue on the current therapy.  Call Mr. Rho with the results and send results to Mechele Claude, MD

## 2021-07-13 ENCOUNTER — Encounter: Payer: Self-pay | Admitting: Family Medicine

## 2021-07-13 ENCOUNTER — Ambulatory Visit: Payer: BC Managed Care – PPO | Admitting: Family Medicine

## 2021-07-13 VITALS — BP 134/79 | HR 83 | Temp 99.9°F | Ht 67.0 in | Wt 194.4 lb

## 2021-07-13 DIAGNOSIS — R509 Fever, unspecified: Secondary | ICD-10-CM | POA: Diagnosis not present

## 2021-07-13 DIAGNOSIS — A084 Viral intestinal infection, unspecified: Secondary | ICD-10-CM

## 2021-07-13 LAB — VERITOR FLU A/B WAIVED
Influenza A: NEGATIVE
Influenza B: NEGATIVE

## 2021-07-13 NOTE — Progress Notes (Signed)
? ?Acute Office Visit ? ?Subjective:  ? ? Patient ID: Gregg Morgan, male    DOB: March 25, 1961, 61 y.o.   MRN: [id number] ? ?Chief Complaint  ?Patient presents with  ? Fever  ? ? ?HPI ?Patient is in today for fever, body aches, and diarrhea. These symptoms started suddenly yesterday. He reports that he has had 10+ episodes of diarrhea. He has cramping prior to having to have a BM but denies abdominal pain between BMs. He denies cough, congestion, sore throat, dizziness, or vomiting. He has not tried any remedies. He is staying well hydrated.  ? ?Past Medical History:  ?Diagnosis Date  ? Arthritis   ? right knee  ? Coronary artery disease   ? GERD (gastroesophageal reflux disease)   ? Heart murmur   ? Hyperlipidemia   ? Hypertension   ? Obstructive sleep apnea   ? wears cpap  ? Pericarditis   ? Sleep apnea   ? ? ?Past Surgical History:  ?Procedure Laterality Date  ? CARDIAC CATHETERIZATION    ? 09/2009--DR  BERRY  ? FINGER SURGERY    ? ORIF FINGER FRACTURE  08/03/2011  ? Procedure: OPEN REDUCTION INTERNAL FIXATION (ORIF) METACARPAL (FINGER) FRACTURE;  Surgeon: Linna Hoff, MD;  Location: [facility];  Service: Orthopedics;  Laterality: Right;  RIGHT THUMB ORIF  ? UPPER GASTROINTESTINAL ENDOSCOPY  2012  ? [name]  ? ? ?Family History  ?Problem Relation Age of Onset  ? Thyroid disease Mother   ? Hypertension Mother   ? Colon cancer Father   ? Pancreatic cancer Neg Hx   ? Esophageal cancer Neg Hx   ? Liver cancer Neg Hx   ? Stomach cancer Neg Hx   ? Rectal cancer Neg Hx   ? Colon polyps Neg Hx   ? ? ?Social History  ? ?Socioeconomic History  ? Marital status: Married  ?  Spouse name: Not on file  ? Number of children: 1  ? Years of education: 40  ? Highest education level: Not on file  ?Occupational History  ? Occupation: Glass blower/designer  ?  Employer: [employer]  ? Occupation: Occupational hygienist  ?Tobacco Use  ? Smoking status: Former  ?  Packs/day: 0.50  ?  Years: 20.00  ?  Pack years: 10.00  ?  Types:  Cigarettes  ?  Quit date: 03/12/2016  ?  Years since quitting: 5.3  ? Smokeless tobacco: Never  ?Vaping Use  ? Vaping Use: Not on file  ?Substance and Sexual Activity  ? Alcohol use: Yes  ?  Alcohol/week: 2.0 standard drinks  ?  Types: 2 Cans of beer per week  ?  Comment: Occasion on weekends  ? Drug use: No  ? Sexual activity: Not on file  ?Other Topics Concern  ? Not on file  ?Social History Narrative  ? Married, lives with wife and daughter.  One child.  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   ? ?Social Determinants of Health  ? ?Financial Resource Strain: Not on file  ?Food Insecurity: Not on file  ?Transportation Needs: Not on file  ?Physical Activity: Not on file  ?Stress: Not on file  ?Social Connections: Not on file  ?Intimate Partner Violence: Not on file  ? ? ?Outpatient Medications Prior to Visit  ?Medication Sig Dispense Refill  ? acetaminophen (TYLENOL) 500 MG tablet Take 500 mg by mouth every 6 (six) hours as needed.    ? aspirin 81 MG tablet Take 81 mg by mouth at  bedtime.    ? Cholecalciferol (VITAMIN D) 2000 UNITS CAPS Take 1 capsule by mouth at bedtime.    ? fish oil-omega-3 fatty acids 1000 MG capsule Take 1,000 mg by mouth at bedtime.    ? lisinopril (ZESTRIL) 20 MG tablet Take 1 tablet (20 mg total) by mouth daily. 90 tablet 2  ? methocarbamol (ROBAXIN) 500 MG tablet Take 1 tablet (500 mg total) by mouth 4 (four) times daily. 60 tablet 1  ? nabumetone (RELAFEN) 500 MG tablet Take 2 tablets (1,000 mg total) by mouth 2 (two) times daily. For muscle and joint pain 360 tablet 2  ? pantoprazole (PROTONIX) 40 MG tablet Take 1 tablet (40 mg total) by mouth daily. 90 tablet 2  ? rosuvastatin (CRESTOR) 40 MG tablet Take 1 tablet (40 mg total) by mouth daily. 90 tablet 3  ? Sod Picosulfate-Mag Ox-Cit Acd (CLENPIQ) 10-3.5-12 MG-GM -GM/160ML SOLN Take 1 kit by mouth as directed. (Patient not taking: Reported on 07/13/2021) 320 mL 0  ? Vitamin D, Ergocalciferol, (DRISDOL) 1.25 MG (50000 UNIT) CAPS capsule Take 1  capsule (50,000 Units total) by mouth every 7 (seven) days. (Patient not taking: Reported on 07/13/2021) 13 capsule 3  ? ?No facility-administered medications prior to visit.  ? ? ?No Known Allergies ? ?Review of Systems ?As per HPI.  ?   ?Objective:  ?  ?Physical Exam ?Vitals and nursing note reviewed.  ?Constitutional:   ?   General: He is not in acute distress. ?   Appearance: He is not ill-appearing, toxic-appearing or diaphoretic.  ?Cardiovascular:  ?   Rate and Rhythm: Normal rate and regular rhythm.  ?   Heart sounds: Normal heart sounds. No murmur heard. ?Pulmonary:  ?   Effort: Pulmonary effort is normal. No respiratory distress.  ?   Breath sounds: Normal breath sounds.  ?Abdominal:  ?   General: Bowel sounds are normal. There is no distension.  ?   Palpations: Abdomen is soft.  ?   Tenderness: There is no abdominal tenderness. There is no guarding or rebound.  ?Musculoskeletal:  ?   Right lower leg: No edema.  ?   Left lower leg: No edema.  ?Skin: ?   General: Skin is warm and dry.  ?Neurological:  ?   General: No focal deficit present.  ?   Mental Status: He is alert and oriented to person, place, and time.  ?Psychiatric:     ?   Mood and Affect: Mood normal.     ?   Behavior: Behavior normal.  ? ? ?BP 134/79   Pulse 83   Temp 99.9 ?F (37.7 ?C) (Temporal)   Ht '5\' 7"'  (1.702 m)   Wt 194 lb 6 oz (88.2 kg)   SpO2 96%   BMI 30.44 kg/m?  ?Wt Readings from Last 3 Encounters:  ?07/13/21 194 lb 6 oz (88.2 kg)  ?05/21/21 196 lb (88.9 kg)  ?04/18/21 198 lb (89.8 kg)  ? ? ?Health Maintenance Due  ?Topic Date Due  ? COLONOSCOPY (Pts 45-62yr Insurance coverage will need to be confirmed)  Never done  ? COVID-19 Vaccine (3 - Booster for Moderna series) 03/02/2020  ? ? ?There are no preventive care reminders to display for this patient. ? ? ?Lab Results  ?Component Value Date  ? TSH 2.210 11/29/2014  ? ?Lab Results  ?Component Value Date  ? WBC 5.9 05/21/2021  ? HGB 16.4 05/21/2021  ? HCT 49.7 05/21/2021  ? MCV 91  05/21/2021  ? PLT 250  05/21/2021  ? ?Lab Results  ?Component Value Date  ? NA 140 05/21/2021  ? K 4.5 05/21/2021  ? CO2 25 05/21/2021  ? GLUCOSE 115 (H) 05/21/2021  ? BUN 10 05/21/2021  ? CREATININE 0.97 05/21/2021  ? BILITOT 0.5 05/21/2021  ? ALKPHOS 108 05/21/2021  ? AST 26 05/21/2021  ? ALT 28 05/21/2021  ? PROT 7.4 05/21/2021  ? ALBUMIN 4.9 (H) 05/21/2021  ? CALCIUM 10.1 05/21/2021  ? ANIONGAP 13 12/11/2020  ? EGFR 89 05/21/2021  ? ?Lab Results  ?Component Value Date  ? CHOL 188 05/21/2021  ? ?Lab Results  ?Component Value Date  ? HDL 58 05/21/2021  ? ?Lab Results  ?Component Value Date  ? [place] 82 05/21/2021  ? ?Lab Results  ?Component Value Date  ? TRIG 298 (H) 05/21/2021  ? ?Lab Results  ?Component Value Date  ? CHOLHDL 3.2 05/21/2021  ? ?Lab Results  ?Component Value Date  ? HGBA1C  10/09/2009  ?  5.6 ?(NOTE)                                                                       According to the ADA Clinical Practice Recommendations for 2011, when HbA1c is used as a screening test:   >=6.5%   Diagnostic of Diabetes Mellitus           (if abnormal result ? is confirmed)  5.7-6.4%   Increased risk of developing Diabetes Mellitus  References:Diagnosis and Classification of Diabetes Mellitus,Diabetes SJGG,8366,29(UTMLY 1):S62-S69 and Standards of Medical Care in         Diabetes - 2011,Diabetes [id number],5465,68  ?(Suppl 1):S11-S61.  ? ? ?   ?Assessment & Plan:  ? ?Emeka was seen today for fever. ? ?Diagnoses and all orders for this visit: ? ?Viral gastroenteritis ?Flu negative today. Covid pending. Discussed with patient to check myChart for results tomorrow. If positive, instructed him to call access line this weekend and I will send in a Rx for antiviral. Discussed immodium, hydration, bland diet. Tylenol for pain, fever. Strict return precautions given.  ?-     Veritor Flu A/B Waived ?-     Novel Coronavirus, NAA ([facility]) ? ?The patient indicates understanding of these issues and agrees with the  plan. ? ?Gwenlyn Perking, FNP ? ?

## 2021-07-13 NOTE — Patient Instructions (Signed)
Diarrhea, Adult ?Diarrhea is frequent loose and watery bowel movements. Diarrhea can make you feel weak and cause you to become dehydrated. Dehydration can make you tired and thirsty, cause you to have a dry mouth, and decrease how often you urinate. ?Diarrhea typically lasts 2-3 days. However, it can last longer if it is a sign of something more serious. It is important to treat your diarrhea as told by your health care provider. ?Follow these instructions at home: ?Eating and drinking ?  ?Follow these recommendations as told by your health care provider: ?Take an oral rehydration solution (ORS). This is an over-the-counter medicine that helps return your body to its normal balance of nutrients and water. It is found at pharmacies and retail stores. ?Drink plenty of fluids, such as water, ice chips, diluted fruit juice, and low-calorie sports drinks. You can drink milk also, if desired. ?Avoid drinking fluids that contain a lot of sugar or caffeine, such as energy drinks, sports drinks, and soda. ?Eat bland, easy-to-digest foods in small amounts as you are able. These foods include bananas, applesauce, rice, lean meats, toast, and crackers. ?Avoid alcohol. ?Avoid spicy or fatty foods. ? ?Medicines ?Take over-the-counter and prescription medicines only as told by your health care provider. ?If you were prescribed an antibiotic medicine, take it as told by your health care provider. Do not stop using the antibiotic even if you start to feel better. ?General instructions ? ?Wash your hands often using soap and water. If soap and water are not available, use a hand sanitizer. Others in the household should wash their hands as well. Hands should be washed: ?After using the toilet or changing a diaper. ?Before preparing, cooking, or serving food. ?While caring for a sick person or while visiting someone in a hospital. ?Drink enough fluid to keep your urine pale yellow. ?Rest at home while you recover. ?Watch your  condition for any changes. ?Take a warm bath to relieve any burning or pain from frequent diarrhea episodes. ?Keep all follow-up visits as told by your health care provider. This is important. ?Contact a health care provider if: ?You have a fever. ?Your diarrhea gets worse. ?You have new symptoms. ?You cannot keep fluids down. ?You feel light-headed or dizzy. ?You have a headache. ?You have muscle cramps. ?Get help right away if: ?You have chest pain. ?You feel extremely weak or you faint. ?You have bloody or black stools or stools that look like tar. ?You have severe pain, cramping, or bloating in your abdomen. ?You have trouble breathing or you are breathing very quickly. ?Your heart is beating very quickly. ?Your skin feels cold and clammy. ?You feel confused. ?You have signs of dehydration, such as: ?Dark urine, very little urine, or no urine. ?Cracked lips. ?Dry mouth. ?Sunken eyes. ?Sleepiness. ?Weakness. ?Summary ?Diarrhea is frequent loose and sometimes watery bowel movements. Diarrhea can make you feel weak and cause you to become dehydrated. ?Drink enough fluids to keep your urine pale yellow. ?Make sure that you wash your hands after using the toilet. If soap and water are not available, use hand sanitizer. ?Contact a health care provider if your diarrhea gets worse or you have new symptoms. ?Get help right away if you have signs of dehydration. ?This information is not intended to replace advice given to you by your health care provider. Make sure you discuss any questions you have with your health care provider. ?Document Revised: 10/11/2020 Document Reviewed: 10/11/2020 ?Elsevier Patient Education ? 2022 Elsevier Inc. ? ?

## 2021-07-14 LAB — NOVEL CORONAVIRUS, NAA: SARS-CoV-2, NAA: NOT DETECTED

## 2021-07-17 ENCOUNTER — Telehealth: Payer: Self-pay | Admitting: Gastroenterology

## 2021-07-17 ENCOUNTER — Other Ambulatory Visit: Payer: Self-pay

## 2021-07-17 MED ORDER — METRONIDAZOLE 500 MG PO TABS: 500.0000 mg | ORAL_TABLET | Freq: Three times a day (TID) | ORAL | 0 refills | Status: DC

## 2021-07-17 MED ORDER — CIPROFLOXACIN HCL 500 MG PO TABS: 500.0000 mg | ORAL_TABLET | Freq: Two times a day (BID) | ORAL | 0 refills | Status: DC

## 2021-07-17 NOTE — Telephone Encounter (Signed)
Called and spoke with pt's wife and gave her recommendations. Prescriptions sent to pt's pharmacy. Pt's wife verbalized understanding and had no other concerns at end of call.  ?

## 2021-07-17 NOTE — Telephone Encounter (Signed)
Spoke with pt's wife who stated that pt's diarrhea started Friday. Pt had a doctor's appt on Friday and was tested for the flu which was negative. Pt's wife reports pt has had a fever (99.9) aches and chills, and abd pain on the left side that feels similar to when he had diverticulitis in the past.  ? ?Dr. Leonides Schanz as DOD please advise.  ?

## 2021-07-17 NOTE — Telephone Encounter (Signed)
Patient wife called stating patient has been having severe diarrhea since this past weekend, pain in his left side that hurts to the touch, fever and Is questioning whether or not he may be having a diverticulitis flare-up.  Everything he eats or drinks goes right through him.  He has a scheduled appointment with Dr. Barron Alvine in [facility] on 4/27 at 3:40 and wants to know what he should do between now and then. ?

## 2021-08-09 ENCOUNTER — Ambulatory Visit: Payer: BC Managed Care – PPO | Admitting: Gastroenterology

## 2021-11-20 ENCOUNTER — Ambulatory Visit (INDEPENDENT_AMBULATORY_CARE_PROVIDER_SITE_OTHER): Payer: BC Managed Care – PPO | Admitting: Family Medicine

## 2021-11-20 ENCOUNTER — Encounter: Payer: Self-pay | Admitting: Family Medicine

## 2021-11-20 VITALS — BP 115/73 | HR 71 | Temp 97.6°F | Ht 67.0 in | Wt 189.6 lb

## 2021-11-20 DIAGNOSIS — N500 Atrophy of testis: Secondary | ICD-10-CM

## 2021-11-20 DIAGNOSIS — I10 Essential (primary) hypertension: Secondary | ICD-10-CM | POA: Diagnosis not present

## 2021-11-20 DIAGNOSIS — Z0000 Encounter for general adult medical examination without abnormal findings: Secondary | ICD-10-CM | POA: Diagnosis not present

## 2021-11-20 DIAGNOSIS — Z0001 Encounter for general adult medical examination with abnormal findings: Secondary | ICD-10-CM | POA: Diagnosis not present

## 2021-11-20 DIAGNOSIS — E559 Vitamin D deficiency, unspecified: Secondary | ICD-10-CM | POA: Diagnosis not present

## 2021-11-20 DIAGNOSIS — E782 Mixed hyperlipidemia: Secondary | ICD-10-CM

## 2021-11-20 DIAGNOSIS — Z125 Encounter for screening for malignant neoplasm of prostate: Secondary | ICD-10-CM

## 2021-11-20 LAB — URINALYSIS
Bilirubin, UA: NEGATIVE
Glucose, UA: NEGATIVE
Leukocytes,UA: NEGATIVE
Nitrite, UA: NEGATIVE
Specific Gravity, UA: 1.02 (ref 1.005–1.030)
Urobilinogen, Ur: 1 mg/dL (ref 0.2–1.0)
pH, UA: 7 (ref 5.0–7.5)

## 2021-11-20 NOTE — Progress Notes (Signed)
Subjective:  Patient ID: Gregg Morgan, male    DOB: 1960/12/20  Age: 61 y.o. MRN: [id number]  CC: Annual Exam   HPI Tri Chittick Gesner presents for Annual exam.       11/20/2021    1:59 PM 05/21/2021    8:17 AM 01/22/2021    4:24 PM  Depression screen PHQ 2/9  Decreased Interest 0 0 0  Down, Depressed, Hopeless 0 0 0  PHQ - 2 Score 0 0 0  Altered sleeping   0  Tired, decreased energy   0  Change in appetite   0  Feeling bad or failure about yourself    0  Trouble concentrating   0  Moving slowly or fidgety/restless   0  Suicidal thoughts   0  PHQ-9 Score   0  Difficult doing work/chores   Not difficult at all    History Jadie has a past medical history of Arthritis, Coronary artery disease, GERD (gastroesophageal reflux disease), Heart murmur, Hyperlipidemia, Hypertension, Obstructive sleep apnea, Pericarditis, and Sleep apnea.   He has a past surgical history that includes Finger surgery; Cardiac catheterization; ORIF finger fracture (08/03/2011); and Upper gastrointestinal endoscopy (2012).   His family history includes Colon cancer in his father; Hypertension in his mother; Thyroid disease in his mother.He reports that he quit smoking about 5 years ago. His smoking use included cigarettes. He has a 10.00 pack-year smoking history. He has never used smokeless tobacco. He reports current alcohol use of about 2.0 standard drinks of alcohol per week. He reports that he does not use drugs.    ROS Review of Systems  Constitutional:  Negative for activity change, fatigue, fever and unexpected weight change.  HENT:  Negative for congestion, ear pain, hearing loss, postnasal drip and trouble swallowing.   Eyes:  Negative for pain and visual disturbance.  Respiratory:  Negative for cough, chest tightness and shortness of breath.   Cardiovascular:  Negative for chest pain, palpitations and leg swelling.  Gastrointestinal:  Negative for abdominal distention, abdominal  pain, blood in stool, constipation, diarrhea, nausea and vomiting.  Endocrine: Negative for cold intolerance, heat intolerance and polydipsia.  Genitourinary:  Negative for difficulty urinating, dysuria, flank pain, frequency and urgency.  Musculoskeletal:  Negative for arthralgias and joint swelling.  Skin:  Negative for color change, rash and wound.  Neurological:  Negative for dizziness, syncope, speech difficulty, weakness, light-headedness, numbness and headaches.  Hematological:  Does not bruise/bleed easily.  Psychiatric/Behavioral:  Negative for confusion, decreased concentration, dysphoric mood and sleep disturbance. The patient is not nervous/anxious.     Objective:  BP 115/73   Pulse 71   Temp 97.6 F (36.4 C)   Ht '5\' 7"'  (1.702 m)   Wt 189 lb 9.6 oz (86 kg)   SpO2 97%   BMI 29.70 kg/m   BP Readings from Last 3 Encounters:  11/20/21 115/73  07/13/21 134/79  05/21/21 130/80    Wt Readings from Last 3 Encounters:  11/20/21 189 lb 9.6 oz (86 kg)  07/13/21 194 lb 6 oz (88.2 kg)  05/21/21 196 lb (88.9 kg)     Physical Exam Vitals reviewed.  Constitutional:      Appearance: He is well-developed.  HENT:     Head: Normocephalic and atraumatic.     Right Ear: External ear normal.     Left Ear: External ear normal.     Mouth/Throat:     Pharynx: No oropharyngeal exudate or posterior oropharyngeal erythema.  Eyes:  Pupils: Pupils are equal, round, and reactive to light.  Neck:     Thyroid: No thyromegaly.     Trachea: No tracheal deviation.  Cardiovascular:     Rate and Rhythm: Normal rate and regular rhythm.     Heart sounds: Normal heart sounds. No murmur heard.    No friction rub. No gallop.  Pulmonary:     Effort: No respiratory distress.     Breath sounds: Normal breath sounds. No wheezing or rales.  Abdominal:     General: Bowel sounds are normal. There is no distension.     Palpations: Abdomen is soft. There is no mass.     Tenderness: There is no  abdominal tenderness.     Hernia: There is no hernia in the left inguinal area.  Genitourinary:    Penis: Normal.      Testes: Normal.  Musculoskeletal:        General: Normal range of motion.     Cervical back: Normal range of motion and neck supple.  Lymphadenopathy:     Cervical: No cervical adenopathy.  Skin:    General: Skin is warm and dry.  Neurological:     Mental Status: He is alert and oriented to person, place, and time.       Assessment & Plan:   Emma was seen today for annual exam.  Diagnoses and all orders for this visit:  Well adult exam -     CBC with Differential/Platelet -     CMP14+EGFR -     Lipid panel -     PSA, total and free -     VITAMIN D 25 Hydroxy (Vit-D Deficiency, Fractures) -     Urinalysis  Primary hypertension -     CBC with Differential/Platelet -     CMP14+EGFR  Mixed hyperlipidemia -     Lipid panel  Vitamin D deficiency -     VITAMIN D 25 Hydroxy (Vit-D Deficiency, Fractures)  Screening for prostate cancer -     PSA, total and free  Atrophy of testis Comments: Left        I have discontinued Lanny Hurst L. Blanks's Clenpiq, methocarbamol, nabumetone, ciprofloxacin, and metroNIDAZOLE. I am also having him maintain his aspirin, fish oil-omega-3 fatty acids, Vitamin D, Vitamin D (Ergocalciferol), acetaminophen, rosuvastatin, lisinopril, and pantoprazole.  Allergies as of 11/20/2021   No Known Allergies      Medication List        Accurate as of November 20, 2021  2:41 PM. If you have any questions, ask your nurse or doctor.          STOP taking these medications    ciprofloxacin 500 MG tablet Commonly known as: Cipro Stopped by: Claretta Fraise, MD   Clenpiq 10-3.5-12 MG-GM -GM/160ML Soln Generic drug: Sod Picosulfate-Mag Ox-Cit Acd Stopped by: Claretta Fraise, MD   methocarbamol 500 MG tablet Commonly known as: Robaxin Stopped by: Claretta Fraise, MD   metroNIDAZOLE 500 MG tablet Commonly known as:  FLAGYL Stopped by: Claretta Fraise, MD   nabumetone 500 MG tablet Commonly known as: RELAFEN Stopped by: Claretta Fraise, MD       TAKE these medications    acetaminophen 500 MG tablet Commonly known as: TYLENOL Take 500 mg by mouth every 6 (six) hours as needed.   aspirin 81 MG tablet Take 81 mg by mouth at bedtime.   fish oil-omega-3 fatty acids 1000 MG capsule Take 1,000 mg by mouth at bedtime.   lisinopril 20 MG tablet Commonly  known as: ZESTRIL Take 1 tablet (20 mg total) by mouth daily.   pantoprazole 40 MG tablet Commonly known as: PROTONIX Take 1 tablet (40 mg total) by mouth daily.   rosuvastatin 40 MG tablet Commonly known as: CRESTOR Take 1 tablet (40 mg total) by mouth daily.   Vitamin D (Ergocalciferol) 1.25 MG (50000 UNIT) Caps capsule Commonly known as: DRISDOL Take 1 capsule (50,000 Units total) by mouth every 7 (seven) days.   Vitamin D 50 MCG (2000 UT) Caps Take 1 capsule by mouth at bedtime.         Follow-up: Return in about 6 months (around 05/23/2022) for hypertension, cholesterol.  Claretta Fraise, M.D.

## 2021-11-21 ENCOUNTER — Other Ambulatory Visit: Payer: Self-pay | Admitting: *Deleted

## 2021-11-21 LAB — CMP14+EGFR
ALT: 23 IU/L (ref 0–44)
AST: 21 IU/L (ref 0–40)
Albumin/Globulin Ratio: 2.1 (ref 1.2–2.2)
Albumin: 4.9 g/dL (ref 3.9–4.9)
Alkaline Phosphatase: 96 IU/L (ref 44–121)
BUN/Creatinine Ratio: 10 (ref 10–24)
BUN: 9 mg/dL (ref 8–27)
Bilirubin Total: 0.4 mg/dL (ref 0.0–1.2)
CO2: 21 mmol/L (ref 20–29)
Calcium: 9.9 mg/dL (ref 8.6–10.2)
Chloride: 103 mmol/L (ref 96–106)
Creatinine, Ser: 0.91 mg/dL (ref 0.76–1.27)
Globulin, Total: 2.3 g/dL (ref 1.5–4.5)
Glucose: 92 mg/dL (ref 70–99)
Potassium: 4.8 mmol/L (ref 3.5–5.2)
Sodium: 144 mmol/L (ref 134–144)
Total Protein: 7.2 g/dL (ref 6.0–8.5)
eGFR: 96 mL/min/{1.73_m2} (ref >59)

## 2021-11-21 LAB — CBC WITH DIFFERENTIAL/PLATELET
Basophils Absolute: 0.1 10*3/uL (ref 0.0–0.2)
Basos: 1 %
EOS (ABSOLUTE): 0.1 10*3/uL (ref 0.0–0.4)
Eos: 1 %
Hematocrit: 47.7 % (ref 37.5–51.0)
Hemoglobin: 16.7 g/dL (ref 13.0–17.7)
Immature Grans (Abs): 0 10*3/uL (ref 0.0–0.1)
Immature Granulocytes: 0 %
Lymphocytes Absolute: 1.9 10*3/uL (ref 0.7–3.1)
Lymphs: 28 %
MCH: 32.2 pg (ref 26.6–33.0)
MCHC: 35 g/dL (ref 31.5–35.7)
MCV: 92 fL (ref 79–97)
Monocytes Absolute: 0.5 10*3/uL (ref 0.1–0.9)
Monocytes: 7 %
Neutrophils Absolute: 4.4 10*3/uL (ref 1.4–7.0)
Neutrophils: 63 %
Platelets: 260 10*3/uL (ref 150–450)
RBC: 5.19 x10E6/uL (ref 4.14–5.80)
RDW: 12.7 % (ref 11.6–15.4)
WBC: 6.9 10*3/uL (ref 3.4–10.8)

## 2021-11-21 LAB — PSA, TOTAL AND FREE
PSA, Free Pct: 21.4 %
PSA, Free: 0.3 ng/mL
Prostate Specific Ag, Serum: 1.4 ng/mL (ref 0.0–4.0)

## 2021-11-21 LAB — LIPID PANEL
Chol/HDL Ratio: 3.1 ratio (ref 0.0–5.0)
Cholesterol, Total: 183 mg/dL (ref 100–199)
HDL: 60 mg/dL (ref >39)
LDL Chol Calc (NIH): 96 mg/dL (ref 0–99)
Triglycerides: 154 mg/dL — ABNORMAL HIGH (ref 0–149)
VLDL Cholesterol Cal: 27 mg/dL (ref 5–40)

## 2021-11-21 LAB — VITAMIN D 25 HYDROXY (VIT D DEFICIENCY, FRACTURES): Vit D, 25-Hydroxy: 28.6 ng/mL — ABNORMAL LOW (ref 30.0–100.0)

## 2021-11-21 MED ORDER — VITAMIN D (ERGOCALCIFEROL) 1.25 MG (50000 UNIT) PO CAPS: 50000.0000 [IU] | ORAL_CAPSULE | ORAL | 3 refills | Status: DC

## 2021-11-21 NOTE — Progress Notes (Signed)
Dear Gregg Morgan, Your Vitamin D is  low. You need a prescription strength supplement I will send that in for you. Nurse, if at all possible, could you send in a prescription for the patient for vitamin D 50,000 units, 1 p.o. weekly #13 with 3 refills? Many thanks, WS

## 2021-11-26 ENCOUNTER — Encounter: Payer: Self-pay | Admitting: Family Medicine

## 2021-11-26 ENCOUNTER — Ambulatory Visit: Payer: BC Managed Care – PPO | Admitting: Family Medicine

## 2021-11-26 VITALS — BP 132/76 | HR 70 | Temp 97.8°F | Ht 67.0 in | Wt 188.4 lb

## 2021-11-26 DIAGNOSIS — M5441 Lumbago with sciatica, right side: Secondary | ICD-10-CM | POA: Diagnosis not present

## 2021-11-26 MED ORDER — MELOXICAM 15 MG PO TABS: 15.0000 mg | ORAL_TABLET | Freq: Every day | ORAL | 0 refills | Status: DC

## 2021-11-26 MED ORDER — PREDNISONE 20 MG PO TABS: 40.0000 mg | ORAL_TABLET | Freq: Every day | ORAL | 0 refills | Status: AC

## 2021-11-26 MED ORDER — KETOROLAC TROMETHAMINE 60 MG/2ML IM SOLN
60.0000 mg | Freq: Once | INTRAMUSCULAR | Status: AC
  Administered 2021-11-26: 60 mg via INTRAMUSCULAR

## 2021-11-26 MED ORDER — TIZANIDINE HCL 4 MG PO TABS: 4.0000 mg | ORAL_TABLET | Freq: Four times a day (QID) | ORAL | 0 refills | Status: DC | PRN

## 2021-11-26 MED ORDER — METHYLPREDNISOLONE ACETATE 80 MG/ML IJ SUSP
80.0000 mg | Freq: Once | INTRAMUSCULAR | Status: AC
  Administered 2021-11-26: 80 mg via INTRAMUSCULAR

## 2021-11-26 NOTE — Patient Instructions (Signed)

## 2021-11-26 NOTE — Progress Notes (Unsigned)
Acute Office Visit  Subjective:     Patient ID: Gregg Morgan, male    DOB: 06-09-1960, 61 y.o.   MRN: [id number]  Chief Complaint  Patient presents with   Back Pain    Back Pain This is a new problem. The current episode started in the past 7 days. The pain is present in the gluteal and lumbar spine. The quality of the pain is described as shooting and aching. The pain does not radiate. Pain severity now: severe with certain movements. The pain is The same all the time. The symptoms are aggravated by bending, position, standing and twisting. Pertinent negatives include no abdominal pain, bladder incontinence, bowel incontinence, chest pain, dysuria, fever, leg pain, numbness, paresis, paresthesias, pelvic pain, perianal numbness or tingling. He has tried muscle relaxant, heat, ice, analgesics, NSAIDs and home exercises for the symptoms. The treatment provided no relief.  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 Previous episode similar to this responded to steroids.    Review of Systems  Constitutional:  Negative for fever.  Cardiovascular:  Negative for chest pain.  Gastrointestinal:  Negative for abdominal pain and bowel incontinence.  Genitourinary:  Negative for bladder incontinence, dysuria and pelvic pain.  Musculoskeletal:  Positive for back pain.  Neurological:  Negative for tingling, numbness and paresthesias.        Objective:    BP 132/76   Pulse 70   Temp 97.8 F (36.6 C) (Temporal)   Ht 5\' 7"  (1.702 m)   Wt 188 lb 6 oz (85.4 kg)   SpO2 96%   BMI 29.50 kg/m  {Vitals History (Optional):23777}  Physical Exam Vitals and nursing note reviewed.  Constitutional:      General: He is not in acute distress.    Appearance: He is not toxic-appearing or diaphoretic.  Cardiovascular:     Rate and Rhythm: Normal rate and regular rhythm.     Heart sounds: Normal heart sounds. No murmur heard. Pulmonary:     Effort: Pulmonary effort is normal. No respiratory  distress.     Breath sounds: Normal breath sounds.  Musculoskeletal:     Lumbar back: Tenderness (gluteal) present. No swelling, edema, deformity, signs of trauma or bony tenderness. Decreased range of motion (limited due to pain).     Right lower leg: No edema.     Left lower leg: No edema.  Skin:    General: Skin is warm and dry.  Neurological:     General: No focal deficit present.     Mental Status: He is alert and oriented to person, place, and time.     Motor: No weakness.     Gait: Gait abnormal (antalgic).  Psychiatric:        Mood and Affect: Mood normal.        Behavior: Behavior normal.     No results found for any visits on 11/26/21.     Assessment & Plan:   Gavriel was seen today for back pain.  Diagnoses and all orders for this visit:  Acute right-sided low back pain with right-sided sciatica No red flags today. Toradol and steroid IM injection today in the office. Start oral prednisone burst and meloxicam tomorrow. Do not take other NSAIDs with meloxicam. He is unable to attend PT due to time constraints. He will complete home PT instead. Handout with exercises given.  -     meloxicam (MOBIC) 15 MG tablet; Take 1 tablet (15 mg total) by mouth daily. -  tiZANidine (ZANAFLEX) 4 MG tablet; Take 1 tablet (4 mg total) by mouth every 6 (six) hours as needed for muscle spasms. -     ketorolac (TORADOL) injection 60 mg -     methylPREDNISolone acetate (DEPO-MEDROL) injection 80 mg -     predniSONE (DELTASONE) 20 MG tablet; Take 2 tablets (40 mg total) by mouth daily with breakfast for 5 days.   Return if symptoms worsen or fail to improve.  The patient indicates understanding of these issues and agrees with the plan.  Gabriel Earing, FNP

## 2021-12-24 ENCOUNTER — Other Ambulatory Visit: Payer: Self-pay | Admitting: Family Medicine

## 2021-12-24 DIAGNOSIS — M5441 Lumbago with sciatica, right side: Secondary | ICD-10-CM

## 2022-02-10 ENCOUNTER — Other Ambulatory Visit: Payer: Self-pay | Admitting: Cardiology

## 2022-03-30 ENCOUNTER — Other Ambulatory Visit: Payer: Self-pay | Admitting: Family Medicine

## 2022-04-02 ENCOUNTER — Other Ambulatory Visit: Payer: Self-pay | Admitting: Family Medicine

## 2022-04-02 DIAGNOSIS — I10 Essential (primary) hypertension: Secondary | ICD-10-CM

## 2022-05-23 ENCOUNTER — Ambulatory Visit: Payer: Self-pay | Admitting: Family Medicine

## 2022-06-25 ENCOUNTER — Ambulatory Visit: Payer: Self-pay | Admitting: Family Medicine

## 2022-07-10 ENCOUNTER — Ambulatory Visit: Payer: Commercial Managed Care - PPO | Admitting: Family Medicine

## 2022-07-10 ENCOUNTER — Encounter: Payer: Self-pay | Admitting: Family Medicine

## 2022-07-10 VITALS — BP 139/84 | HR 64 | Temp 99.0°F | Ht 67.0 in | Wt 183.6 lb

## 2022-07-10 DIAGNOSIS — I10 Essential (primary) hypertension: Secondary | ICD-10-CM | POA: Diagnosis not present

## 2022-07-10 DIAGNOSIS — R011 Cardiac murmur, unspecified: Secondary | ICD-10-CM

## 2022-07-10 DIAGNOSIS — M5441 Lumbago with sciatica, right side: Secondary | ICD-10-CM

## 2022-07-10 DIAGNOSIS — E782 Mixed hyperlipidemia: Secondary | ICD-10-CM | POA: Diagnosis not present

## 2022-07-10 MED ORDER — LISINOPRIL 20 MG PO TABS: 20.0000 mg | ORAL_TABLET | Freq: Every day | ORAL | 3 refills | Status: DC

## 2022-07-10 MED ORDER — MELOXICAM 15 MG PO TABS: 15.0000 mg | ORAL_TABLET | Freq: Every day | ORAL | 5 refills | Status: DC

## 2022-07-10 MED ORDER — PANTOPRAZOLE SODIUM 40 MG PO TBEC: 40.0000 mg | DELAYED_RELEASE_TABLET | Freq: Every day | ORAL | 3 refills | Status: DC

## 2022-07-10 NOTE — Progress Notes (Signed)
Subjective:  Patient ID: Gregg Morgan, male    DOB: 1961-04-09  Age: 62 y.o. MRN: [id number]  CC: Medical Management of Chronic Issues   HPI Montario Devol Bardin presents for  presents for  follow-up of hypertension. Patient has no history of headache chest pain or shortness of breath or recent cough. Patient also denies symptoms of TIA such as focal numbness or weakness. Patient denies side effects from medication. States taking it regularly.   in for follow-up of elevated cholesterol. Doing well without complaints on current medication. Denies side effects of statin including myalgia and arthralgia and nausea. Currently no chest pain, shortness of breath or other cardiovascular related symptoms noted.  Patient in for follow-up of GERD. Currently asymptomatic taking  PPI daily. There is no chest pain or heartburn. No hematemesis and no melena. No dysphagia or choking. Onset is remote. Progression is stable. Complicating factors, none.        07/10/2022    1:47 PM 11/20/2021    1:59 PM 05/21/2021    8:17 AM  Depression screen PHQ 2/9  Decreased Interest 0 0 0  Down, Depressed, Hopeless 0 0 0  PHQ - 2 Score 0 0 0    History Cleburn has a past medical history of Arthritis, Coronary artery disease, GERD (gastroesophageal reflux disease), Heart murmur, Hyperlipidemia, Hypertension, Obstructive sleep apnea, Pericarditis, and Sleep apnea.   He has a past surgical history that includes Finger surgery; Cardiac catheterization; ORIF finger fracture (08/03/2011); and Upper gastrointestinal endoscopy (2012).   His family history includes Colon cancer in his father; Hypertension in his mother; Thyroid disease in his mother.He reports that he quit smoking about 6 years ago. His smoking use included cigarettes. He has a 10.00 pack-year smoking history. He has never used smokeless tobacco. He reports current alcohol use of about 2.0 standard drinks of alcohol per week. He reports that he does not  use drugs.    ROS Review of Systems  Constitutional:  Negative for fever.  Respiratory:  Negative for shortness of breath.   Cardiovascular:  Negative for chest pain.  Musculoskeletal:  Positive for arthralgias (right knee flares at times).  Skin:  Negative for rash.    Objective:  BP 139/84   Pulse 64   Temp 99 F (37.2 C)   Ht 5\' 7"  (1.702 m)   Wt 183 lb 9.6 oz (83.3 kg)   SpO2 96%   BMI 28.76 kg/m   BP Readings from Last 3 Encounters:  07/10/22 139/84  11/26/21 132/76  11/20/21 115/73    Wt Readings from Last 3 Encounters:  07/10/22 183 lb 9.6 oz (83.3 kg)  11/26/21 188 lb 6 oz (85.4 kg)  11/20/21 189 lb 9.6 oz (86 kg)     Physical Exam Vitals reviewed.  Constitutional:      Appearance: He is well-developed.  HENT:     Head: Normocephalic and atraumatic.     Right Ear: External ear normal.     Left Ear: External ear normal.     Mouth/Throat:     Pharynx: No oropharyngeal exudate or posterior oropharyngeal erythema.  Eyes:     Pupils: Pupils are equal, round, and reactive to light.  Cardiovascular:     Rate and Rhythm: Normal rate and regular rhythm.     Heart sounds: Murmur heard.     Systolic murmur is present with a grade of 2/6.  Pulmonary:     Effort: No respiratory distress.     Breath sounds: Normal  breath sounds.  Musculoskeletal:     Cervical back: Normal range of motion and neck supple.  Neurological:     Mental Status: He is alert and oriented to person, place, and time.       Assessment & Plan:   Donnelly was seen today for medical management of chronic issues.  Diagnoses and all orders for this visit:  Mixed hyperlipidemia -     Lipid panel  Essential hypertension -     lisinopril (ZESTRIL) 20 MG tablet; Take 1 tablet (20 mg total) by mouth daily. -     CMP14+EGFR -     CBC with Differential/Platelet  Acute right-sided low back pain with right-sided sciatica -     meloxicam (MOBIC) 15 MG tablet; Take 1 tablet (15 mg total) by  mouth daily.  Murmur  Other orders -     pantoprazole (PROTONIX) 40 MG tablet; Take 1 tablet (40 mg total) by mouth daily.       I have changed Lanny Hurst L. Trettin's lisinopril and pantoprazole. I am also having him maintain his aspirin, fish oil-omega-3 fatty acids, Vitamin D, acetaminophen, Vitamin D (Ergocalciferol), tiZANidine, rosuvastatin, and meloxicam.  Allergies as of 07/10/2022   No Known Allergies      Medication List        Accurate as of July 10, 2022  2:50 PM. If you have any questions, ask your nurse or doctor.          acetaminophen 500 MG tablet Commonly known as: TYLENOL Take 500 mg by mouth every 6 (six) hours as needed.   aspirin 81 MG tablet Take 81 mg by mouth at bedtime.   fish oil-omega-3 fatty acids 1000 MG capsule Take 1,000 mg by mouth at bedtime.   lisinopril 20 MG tablet Commonly known as: ZESTRIL Take 1 tablet (20 mg total) by mouth daily.   meloxicam 15 MG tablet Commonly known as: MOBIC Take 1 tablet (15 mg total) by mouth daily.   pantoprazole 40 MG tablet Commonly known as: PROTONIX Take 1 tablet (40 mg total) by mouth daily.   rosuvastatin 40 MG tablet Commonly known as: CRESTOR TAKE 1 TABLET BY MOUTH EVERY DAY   tiZANidine 4 MG tablet Commonly known as: Zanaflex Take 1 tablet (4 mg total) by mouth every 6 (six) hours as needed for muscle spasms.   Vitamin D (Ergocalciferol) 1.25 MG (50000 UNIT) Caps capsule Commonly known as: DRISDOL Take 1 capsule (50,000 Units total) by mouth every 7 (seven) days.   Vitamin D 50 MCG (2000 UT) Caps Take 1 capsule by mouth at bedtime.         Follow-up: Return in about 6 months (around 01/10/2023) for [facility] physical.  Claretta Fraise, M.D.

## 2022-07-11 LAB — CBC WITH DIFFERENTIAL/PLATELET
Basophils Absolute: 0.1 10*3/uL (ref 0.0–0.2)
Basos: 2 %
EOS (ABSOLUTE): 0.1 10*3/uL (ref 0.0–0.4)
Eos: 1 %
Hematocrit: 46.2 % (ref 37.5–51.0)
Hemoglobin: 15.7 g/dL (ref 13.0–17.7)
Immature Grans (Abs): 0 10*3/uL (ref 0.0–0.1)
Immature Granulocytes: 0 %
Lymphocytes Absolute: 1.8 10*3/uL (ref 0.7–3.1)
Lymphs: 25 %
MCH: 31.8 pg (ref 26.6–33.0)
MCHC: 34 g/dL (ref 31.5–35.7)
MCV: 94 fL (ref 79–97)
Monocytes Absolute: 0.6 10*3/uL (ref 0.1–0.9)
Monocytes: 8 %
Neutrophils Absolute: 4.7 10*3/uL (ref 1.4–7.0)
Neutrophils: 64 %
Platelets: 227 10*3/uL (ref 150–450)
RBC: 4.94 x10E6/uL (ref 4.14–5.80)
RDW: 12.8 % (ref 11.6–15.4)
WBC: 7.3 10*3/uL (ref 3.4–10.8)

## 2022-07-11 LAB — LIPID PANEL
Chol/HDL Ratio: 2.6 ratio (ref 0.0–5.0)
Cholesterol, Total: 164 mg/dL (ref 100–199)
HDL: 62 mg/dL (ref >39)
LDL Chol Calc (NIH): 83 mg/dL (ref 0–99)
Triglycerides: 109 mg/dL (ref 0–149)
VLDL Cholesterol Cal: 19 mg/dL (ref 5–40)

## 2022-07-11 LAB — CMP14+EGFR
ALT: 27 IU/L (ref 0–44)
AST: 21 IU/L (ref 0–40)
Albumin/Globulin Ratio: 2.3 — ABNORMAL HIGH (ref 1.2–2.2)
Albumin: 4.8 g/dL (ref 3.9–4.9)
Alkaline Phosphatase: 95 IU/L (ref 44–121)
BUN/Creatinine Ratio: 9 — ABNORMAL LOW (ref 10–24)
BUN: 9 mg/dL (ref 8–27)
Bilirubin Total: 0.6 mg/dL (ref 0.0–1.2)
CO2: 18 mmol/L — ABNORMAL LOW (ref 20–29)
Calcium: 9.7 mg/dL (ref 8.6–10.2)
Chloride: 102 mmol/L (ref 96–106)
Creatinine, Ser: 0.95 mg/dL (ref 0.76–1.27)
Globulin, Total: 2.1 g/dL (ref 1.5–4.5)
Glucose: 87 mg/dL (ref 70–99)
Potassium: 4.7 mmol/L (ref 3.5–5.2)
Sodium: 142 mmol/L (ref 134–144)
Total Protein: 6.9 g/dL (ref 6.0–8.5)
eGFR: 90 mL/min/{1.73_m2} (ref >59)

## 2022-07-11 NOTE — Progress Notes (Signed)
Hello Gregg Morgan,  Your lab result is normal and/or stable.Some minor variations that are not significant are commonly marked abnormal, but do not represent any medical problem for you.  Best regards, Nirali Magouirk, M.D.

## 2022-09-29 NOTE — Progress Notes (Unsigned)
  Cardiology Office Note:   Date:  10/03/2022  ID:  Gregg Morgan, DOB April 25, 1960, MRN [id number] PCP: Mechele Claude, MD  [facility] Providers Cardiologist:  Rollene Rotunda, MD     History of Present Illness:   Gregg Morgan is a 62 y.o. male who presents for evaluation of HCM.  He is referred by Mechele Claude, MD    he had a history of hepatitis in the past.  Cardiac catheterization in 2011 demonstrated LAD mid 50 to 60% stenosis.  This was after the first diagonal.  The right coronary artery had 30 to 40% stenosis.  He had a heart murmur and was found to have HCM on echo.  MRI demonstrated ASH with 17 mm septum.  There was only patchy LGE.  He had no arrhythmias on a 3 day monitor.    Since I last saw him he denies any new cardiovascular symptoms.  He has not been seen in a couple of years.  He had no new shortness of breath, PND or orthopnea.  Does not have any palpitations, presyncope or syncope.  Denies any chest pressure, neck or arm discomfort.  He said no weight gain or edema.  ROS: As stated in the HPI and negative for all other systems.  Studies Reviewed:    EKG: Sinus rhythm, left bundle branch block, rate 63, axis within normal limits.  Risk Assessment/Calculations:         Physical Exam:   VS:  BP 128/84   Pulse 63   Ht 5\' 8"  (1.727 m)   Wt 186 lb (84.4 kg)   BMI 28.28 kg/m    Wt Readings from Last 3 Encounters:  10/02/22 186 lb (84.4 kg)  07/10/22 183 lb 9.6 oz (83.3 kg)  11/26/21 188 lb 6 oz (85.4 kg)     GEN: Well nourished, well developed in no acute distress NECK: No JVD; No carotid bruits CARDIAC: RRR, 3 out of 6 systolic murmur heard at the apex and radiating up the aortic outflow tract with a slight increase with Valsalva strain phase, no diastolic murmurs, rubs, gallops RESPIRATORY:  Clear to auscultation without rales, wheezing or rhonchi  ABDOMEN: Soft, non-tender, non-distended EXTREMITIES:  No edema; No deformity   ASSESSMENT  AND PLAN:   HCM:    We had a long discussion about this.  He has not been able for genetic testing.  Will get his daughter tested is now I think 59 years old.  I will also check health galactose today's to rule out fibroids.  He is otherwise had no high risk findings.  He has no symptoms.  Dyslipidemia: His LDL is 83.  I will add Zetia 10 mg to try to get him to 55 and I will check a lipid profile in 3 months.    HTN: His blood pressure is at target.  No change in therapy.    CAD:  The patient has no new sypmtoms.  No further cardiovascular testing is indicated.  We will continue with aggressive risk reduction and meds as listed.  Sleep apnea: He needs a new CPAP.  Is not been wearing his.  He has known sleep apnea.  He will have a split night in lab sleep study.     Follow up with me in 1 year  Signed, Rollene Rotunda, MD

## 2022-10-02 ENCOUNTER — Ambulatory Visit: Payer: Commercial Managed Care - PPO | Admitting: Cardiology

## 2022-10-02 ENCOUNTER — Other Ambulatory Visit: Payer: Self-pay | Admitting: *Deleted

## 2022-10-02 ENCOUNTER — Encounter: Payer: Self-pay | Admitting: Cardiology

## 2022-10-02 VITALS — BP 128/84 | HR 63 | Ht 68.0 in | Wt 186.0 lb

## 2022-10-02 DIAGNOSIS — G4733 Obstructive sleep apnea (adult) (pediatric): Secondary | ICD-10-CM

## 2022-10-02 DIAGNOSIS — E785 Hyperlipidemia, unspecified: Secondary | ICD-10-CM

## 2022-10-02 DIAGNOSIS — I25119 Atherosclerotic heart disease of native coronary artery with unspecified angina pectoris: Secondary | ICD-10-CM

## 2022-10-02 DIAGNOSIS — I422 Other hypertrophic cardiomyopathy: Secondary | ICD-10-CM | POA: Diagnosis not present

## 2022-10-02 DIAGNOSIS — I10 Essential (primary) hypertension: Secondary | ICD-10-CM | POA: Diagnosis not present

## 2022-10-02 MED ORDER — EZETIMIBE 10 MG PO TABS: 10.0000 mg | ORAL_TABLET | Freq: Every day | ORAL | 3 refills | Status: DC

## 2022-10-02 NOTE — Patient Instructions (Signed)
Medication Instructions:  Please start zetia 10 mg once a day. Continue all other medications as listed.  *If you need a refill on your cardiac medications before your next appointment, please call your pharmacy*  Please have lab work at your closest [place].  Testing/Procedures: Your physician has recommended that you have a sleep study. This test records several body functions during sleep, including: brain activity, eye movement, oxygen and carbon dioxide blood levels, heart rate and rhythm, breathing rate and rhythm, the flow of air through your mouth and nose, snoring, body muscle movements, and chest and belly movement.  Follow-Up: At [facility], you and your health needs are our priority.  As part of our continuing mission to provide you with exceptional heart care, we have created designated Provider Care Teams.  These Care Teams include your primary Cardiologist (physician) and Advanced Practice Providers (APPs -  Physician Assistants and Nurse Practitioners) who all work together to provide you with the care you need, when you need it.  We recommend signing up for the patient portal called "MyChart".  Sign up information is provided on this After Visit Summary.  MyChart is used to connect with patients for Virtual Visits (Telemedicine).  Patients are able to view lab/test results, encounter notes, upcoming appointments, etc.  Non-urgent messages can be sent to your provider as well.   To learn more about what you can do with MyChart, go to [email or web address].    Your next appointment:   1 year(s)  Provider:   Rollene Rotunda, MD

## 2022-10-03 ENCOUNTER — Encounter: Payer: Self-pay | Admitting: Cardiology

## 2022-11-06 ENCOUNTER — Telehealth: Payer: Self-pay | Admitting: Cardiology

## 2022-11-06 DIAGNOSIS — I25119 Atherosclerotic heart disease of native coronary artery with unspecified angina pectoris: Secondary | ICD-10-CM

## 2022-11-06 DIAGNOSIS — K219 Gastro-esophageal reflux disease without esophagitis: Secondary | ICD-10-CM

## 2022-11-06 DIAGNOSIS — I10 Essential (primary) hypertension: Secondary | ICD-10-CM

## 2022-11-06 DIAGNOSIS — G4733 Obstructive sleep apnea (adult) (pediatric): Secondary | ICD-10-CM

## 2022-11-06 NOTE — Telephone Encounter (Signed)
Error

## 2022-11-06 NOTE — Telephone Encounter (Signed)
Wife calling in about sleep study. Please advise

## 2022-11-12 NOTE — Telephone Encounter (Signed)
Split night ordered 

## 2022-12-11 ENCOUNTER — Ambulatory Visit (INDEPENDENT_AMBULATORY_CARE_PROVIDER_SITE_OTHER): Payer: Commercial Managed Care - PPO | Admitting: Family Medicine

## 2022-12-11 ENCOUNTER — Encounter: Payer: Self-pay | Admitting: Family Medicine

## 2022-12-11 ENCOUNTER — Encounter: Payer: Self-pay | Admitting: Gastroenterology

## 2022-12-11 VITALS — BP 137/79 | HR 62 | Temp 98.0°F | Ht 68.0 in | Wt 184.0 lb

## 2022-12-11 DIAGNOSIS — I25119 Atherosclerotic heart disease of native coronary artery with unspecified angina pectoris: Secondary | ICD-10-CM

## 2022-12-11 DIAGNOSIS — I10 Essential (primary) hypertension: Secondary | ICD-10-CM | POA: Diagnosis not present

## 2022-12-11 DIAGNOSIS — Z125 Encounter for screening for malignant neoplasm of prostate: Secondary | ICD-10-CM

## 2022-12-11 DIAGNOSIS — E559 Vitamin D deficiency, unspecified: Secondary | ICD-10-CM | POA: Diagnosis not present

## 2022-12-11 DIAGNOSIS — E782 Mixed hyperlipidemia: Secondary | ICD-10-CM

## 2022-12-11 DIAGNOSIS — Z0001 Encounter for general adult medical examination with abnormal findings: Secondary | ICD-10-CM

## 2022-12-11 DIAGNOSIS — K219 Gastro-esophageal reflux disease without esophagitis: Secondary | ICD-10-CM

## 2022-12-11 DIAGNOSIS — I422 Other hypertrophic cardiomyopathy: Secondary | ICD-10-CM

## 2022-12-11 DIAGNOSIS — Z0000 Encounter for general adult medical examination without abnormal findings: Secondary | ICD-10-CM

## 2022-12-11 LAB — URINALYSIS
Bilirubin, UA: NEGATIVE
Glucose, UA: NEGATIVE
Ketones, UA: NEGATIVE
Leukocytes,UA: NEGATIVE
Nitrite, UA: NEGATIVE
Protein,UA: NEGATIVE
Specific Gravity, UA: 1.025 (ref 1.005–1.030)
Urobilinogen, Ur: 1 mg/dL (ref 0.2–1.0)
pH, UA: 6.5 (ref 5.0–7.5)

## 2022-12-11 MED ORDER — PANTOPRAZOLE SODIUM 40 MG PO TBEC
40.0000 mg | DELAYED_RELEASE_TABLET | Freq: Two times a day (BID) | ORAL | 3 refills | Status: DC
Start: 2022-12-11 — End: 2023-10-23

## 2022-12-11 MED ORDER — ROSUVASTATIN CALCIUM 40 MG PO TABS: 40.0000 mg | ORAL_TABLET | Freq: Every day | ORAL | 3 refills | Status: DC

## 2022-12-11 NOTE — Progress Notes (Signed)
Subjective:  Patient ID: Gregg Morgan, male    DOB: [birth year]  Age: 62 y.o. MRN: [id number]  CC: Annual Exam   HPI Gregg Morgan presents for CPE. Noted to have CAD And hypertrophic cardiomyopathy at recent eval by cardiology.  Due for colooscopy. Having GERD. EGD planned as well. Preiously had to be postponed due to diverticulitis episodes.      12/11/2022    2:40 PM 07/10/2022    1:47 PM 11/20/2021    1:59 PM  Depression screen PHQ 2/9  Decreased Interest 0 0 0  Down, Depressed, Hopeless 0 0 0  PHQ - 2 Score 0 0 0    History Gregg Morgan has a past medical history of Arthritis, Coronary artery disease, GERD (gastroesophageal reflux disease), Heart murmur, Hyperlipidemia, Hypertension, Obstructive sleep apnea, Pericarditis, and Sleep apnea.   He has a past surgical history that includes Finger surgery; Cardiac catheterization; ORIF finger fracture (08/03/2011); and Upper gastrointestinal endoscopy (2012).   His family history includes Colon cancer in his father; Hypertension in his mother; Thyroid disease in his mother.He reports that he quit smoking about 6 years ago. His smoking use included cigarettes. He started smoking about 26 years ago. He has a 10 pack-year smoking history. He has never used smokeless tobacco. He reports current alcohol use of about 2.0 standard drinks of alcohol per week. He reports that he does not use drugs.    ROS Review of Systems  Constitutional:  Negative for activity change, fatigue and unexpected weight change.  HENT:  Negative for congestion, ear pain, hearing loss, postnasal drip and trouble swallowing.   Eyes:  Negative for pain and visual disturbance.  Respiratory:  Positive for cough (at night.Feels burning and coughs). Negative for chest tightness and shortness of breath.   Cardiovascular:  Negative for chest pain, palpitations and leg swelling.  Gastrointestinal:  Positive for abdominal pain (epigastric). Negative for abdominal  distention, blood in stool, constipation, diarrhea, nausea and vomiting.  Endocrine: Negative for cold intolerance, heat intolerance and polydipsia.  Genitourinary:  Negative for difficulty urinating, dysuria, flank pain, frequency and urgency.  Musculoskeletal:  Negative for arthralgias and joint swelling.  Skin:  Negative for color change, rash and wound.  Neurological:  Negative for dizziness, syncope, speech difficulty, weakness, light-headedness, numbness and headaches.  Hematological:  Does not bruise/bleed easily.  Psychiatric/Behavioral:  Negative for confusion, decreased concentration, dysphoric mood and sleep disturbance. The patient is not nervous/anxious.     Objective:  BP 137/79   Pulse 62   Temp 98 F (36.7 C)   Ht 5\' 8"  (1.727 m)   Wt 184 lb (83.5 kg)   SpO2 95%   BMI 27.98 kg/m   BP Readings from Last 3 Encounters:  12/11/22 137/79  10/02/22 128/84  07/10/22 139/84    Wt Readings from Last 3 Encounters:  12/11/22 184 lb (83.5 kg)  10/02/22 186 lb (84.4 kg)  07/10/22 183 lb 9.6 oz (83.3 kg)     Physical Exam Constitutional:      Appearance: He is well-developed.  HENT:     Head: Normocephalic and atraumatic.  Eyes:     Pupils: Pupils are equal, round, and reactive to light.  Neck:     Thyroid: No thyromegaly.     Trachea: No tracheal deviation.  Cardiovascular:     Rate and Rhythm: Normal rate and regular rhythm.     Heart sounds: Normal heart sounds. No murmur heard.    No friction rub. No gallop.  Pulmonary:     Breath sounds: Normal breath sounds. No wheezing or rales.  Abdominal:     General: Bowel sounds are normal. There is no distension.     Palpations: Abdomen is soft. There is no mass.     Tenderness: There is no abdominal tenderness.     Hernia: There is no hernia in the left inguinal area.  Genitourinary:    Penis: Normal.      Testes: Normal.  Musculoskeletal:        General: Normal range of motion.     Cervical back: Normal  range of motion.  Lymphadenopathy:     Cervical: No cervical adenopathy.  Skin:    General: Skin is warm and dry.  Neurological:     Mental Status: He is alert and oriented to person, place, and time.       Assessment & Plan:   Gregg Morgan was seen today for annual exam.  Diagnoses and all orders for this visit:  Well adult exam -     CBC with Differential/Platelet -     CMP14+EGFR -     Lipid panel -     PSA, total and free -     Urinalysis -     VITAMIN D 25 Hydroxy (Vit-D Deficiency, Fractures)  Essential hypertension -     CBC with Differential/Platelet -     CMP14+EGFR  Mixed hyperlipidemia -     Lipid panel  Vitamin D deficiency -     VITAMIN D 25 Hydroxy (Vit-D Deficiency, Fractures)  Screening for prostate cancer -     PSA, total and free  Gastroesophageal reflux disease without esophagitis -     pantoprazole (PROTONIX) 40 MG tablet; Take 1 tablet (40 mg total) by mouth 2 (two) times daily.  Coronary artery disease involving native coronary artery of native heart with angina pectoris (HCC)  Hypertrophic cardiomyopathy (HCC) -     Alpha-Gal Panel -     Alpha galactosidase  Other orders -     rosuvastatin (CRESTOR) 40 MG tablet; Take 1 tablet (40 mg total) by mouth daily.       I have discontinued [name]'s fish oil-omega-3 fatty acids. I have also changed his rosuvastatin and pantoprazole. Additionally, I am having him maintain his aspirin, Vitamin D, acetaminophen, Vitamin D (Ergocalciferol), lisinopril, meloxicam, and ezetimibe.  Allergies as of 12/11/2022   No Known Allergies      Medication List        Accurate as of December 11, 2022  3:26 PM. If you have any questions, ask your nurse or doctor.          STOP taking these medications    fish oil-omega-3 fatty acids 1000 MG capsule Stopped by: Nela Bascom       TAKE these medications    acetaminophen 500 MG tablet Commonly known as: TYLENOL Take 500 mg by mouth every 6  (six) hours as needed.   aspirin 81 MG tablet Take 81 mg by mouth at bedtime.   ezetimibe 10 MG tablet Commonly known as: ZETIA Take 1 tablet (10 mg total) by mouth daily.   lisinopril 20 MG tablet Commonly known as: ZESTRIL Take 1 tablet (20 mg total) by mouth daily.   meloxicam 15 MG tablet Commonly known as: MOBIC Take 1 tablet (15 mg total) by mouth daily.   pantoprazole 40 MG tablet Commonly known as: PROTONIX Take 1 tablet (40 mg total) by mouth 2 (two) times daily. What changed: when  to take this Changed by: Ellin Fitzgibbons   rosuvastatin 40 MG tablet Commonly known as: CRESTOR Take 1 tablet (40 mg total) by mouth daily.   Vitamin D (Ergocalciferol) 1.25 MG (50000 UNIT) Caps capsule Commonly known as: DRISDOL Take 1 capsule (50,000 Units total) by mouth every 7 (seven) days.   Vitamin D 50 MCG (2000 UT) Caps Take 1 capsule by mouth at bedtime.       Pt. Given contact info for [facility]. Had preop 2 years ago. Will let me know if new referral for Colonoscopy is required.   Follow-up: Return in about 6 months (around 06/13/2023).  Mechele Claude, M.D.

## 2022-12-19 LAB — PSA, TOTAL AND FREE
PSA, Free Pct: 33.8 %
PSA, Free: 0.27 ng/mL
Prostate Specific Ag, Serum: 0.8 ng/mL (ref 0.0–4.0)

## 2022-12-19 LAB — VITAMIN D 25 HYDROXY (VIT D DEFICIENCY, FRACTURES): Vit D, 25-Hydroxy: 35.3 ng/mL (ref 30.0–100.0)

## 2022-12-19 LAB — CMP14+EGFR
ALT: 31 IU/L (ref 0–44)
AST: 27 IU/L (ref 0–40)
Albumin: 4.8 g/dL (ref 3.9–4.9)
Alkaline Phosphatase: 83 IU/L (ref 44–121)
BUN/Creatinine Ratio: 11 (ref 10–24)
BUN: 10 mg/dL (ref 8–27)
Bilirubin Total: 0.7 mg/dL (ref 0.0–1.2)
CO2: 22 mmol/L (ref 20–29)
Calcium: 10.1 mg/dL (ref 8.6–10.2)
Chloride: 102 mmol/L (ref 96–106)
Creatinine, Ser: 0.91 mg/dL (ref 0.76–1.27)
Globulin, Total: 2.2 g/dL (ref 1.5–4.5)
Glucose: 90 mg/dL (ref 70–99)
Potassium: 4.4 mmol/L (ref 3.5–5.2)
Sodium: 139 mmol/L (ref 134–144)
Total Protein: 7 g/dL (ref 6.0–8.5)
eGFR: 95 mL/min/{1.73_m2} (ref >59)

## 2022-12-19 LAB — CBC WITH DIFFERENTIAL/PLATELET
Basophils Absolute: 0.1 10*3/uL (ref 0.0–0.2)
Basos: 2 %
EOS (ABSOLUTE): 0.1 10*3/uL (ref 0.0–0.4)
Eos: 1 %
Hematocrit: 45.7 % (ref 37.5–51.0)
Hemoglobin: 15.7 g/dL (ref 13.0–17.7)
Immature Grans (Abs): 0 10*3/uL (ref 0.0–0.1)
Immature Granulocytes: 0 %
Lymphocytes Absolute: 1.7 10*3/uL (ref 0.7–3.1)
Lymphs: 31 %
MCH: 31.5 pg (ref 26.6–33.0)
MCHC: 34.4 g/dL (ref 31.5–35.7)
MCV: 92 fL (ref 79–97)
Monocytes Absolute: 0.5 10*3/uL (ref 0.1–0.9)
Monocytes: 9 %
Neutrophils Absolute: 3.3 10*3/uL (ref 1.4–7.0)
Neutrophils: 57 %
Platelets: 231 10*3/uL (ref 150–450)
RBC: 4.99 x10E6/uL (ref 4.14–5.80)
RDW: 12.9 % (ref 11.6–15.4)
WBC: 5.7 10*3/uL (ref 3.4–10.8)

## 2022-12-19 LAB — LIPID PANEL
Chol/HDL Ratio: 2.5 ratio (ref 0.0–5.0)
Cholesterol, Total: 165 mg/dL (ref 100–199)
HDL: 67 mg/dL (ref >39)
LDL Chol Calc (NIH): 73 mg/dL (ref 0–99)
Triglycerides: 145 mg/dL (ref 0–149)
VLDL Cholesterol Cal: 25 mg/dL (ref 5–40)

## 2022-12-19 LAB — ALPHA GALACTOSIDASE: Alpha-Galactosidase activity: 79.9 nmol/h/mg{prot} (ref >35.5)

## 2022-12-19 LAB — ALPHA-GAL PANEL
Allergen Lamb IgE: <0.1 kU/L
Beef IgE: <0.1 kU/L
IgE (Immunoglobulin E), Serum: 113 [IU]/mL (ref 6–495)
O215-IgE Alpha-Gal: 0.38 kU/L — AB
Pork IgE: <0.1 kU/L

## 2023-01-05 ENCOUNTER — Ambulatory Visit (HOSPITAL_BASED_OUTPATIENT_CLINIC_OR_DEPARTMENT_OTHER): Payer: Commercial Managed Care - PPO | Attending: Cardiology | Admitting: Cardiovascular Disease

## 2023-01-05 VITALS — Ht 66.0 in | Wt 186.0 lb

## 2023-01-05 DIAGNOSIS — G4733 Obstructive sleep apnea (adult) (pediatric): Secondary | ICD-10-CM | POA: Diagnosis present

## 2023-01-05 DIAGNOSIS — I10 Essential (primary) hypertension: Secondary | ICD-10-CM | POA: Insufficient documentation

## 2023-01-05 DIAGNOSIS — K219 Gastro-esophageal reflux disease without esophagitis: Secondary | ICD-10-CM | POA: Diagnosis not present

## 2023-01-05 DIAGNOSIS — I25119 Atherosclerotic heart disease of native coronary artery with unspecified angina pectoris: Secondary | ICD-10-CM | POA: Insufficient documentation

## 2023-01-05 DIAGNOSIS — R0683 Snoring: Secondary | ICD-10-CM | POA: Diagnosis present

## 2023-01-13 ENCOUNTER — Encounter: Payer: Commercial Managed Care - PPO | Admitting: Family Medicine

## 2023-01-15 ENCOUNTER — Encounter: Payer: Self-pay | Admitting: Cardiology

## 2023-01-17 ENCOUNTER — Other Ambulatory Visit: Payer: Self-pay | Admitting: Family Medicine

## 2023-01-17 ENCOUNTER — Encounter (HOSPITAL_BASED_OUTPATIENT_CLINIC_OR_DEPARTMENT_OTHER): Payer: Self-pay | Admitting: Cardiovascular Disease

## 2023-01-17 ENCOUNTER — Encounter: Payer: Self-pay | Admitting: Cardiology

## 2023-01-17 DIAGNOSIS — Z1211 Encounter for screening for malignant neoplasm of colon: Secondary | ICD-10-CM

## 2023-01-17 DIAGNOSIS — Z1212 Encounter for screening for malignant neoplasm of rectum: Secondary | ICD-10-CM

## 2023-01-17 NOTE — Procedures (Signed)
Patient Name: Gregg Morgan, Gregg Morgan Date: 01/05/2023 Gender: Male D.O.B: [birth year] Age (years): 62 Referring Provider: Rollene Rotunda Height (inches): 66 Interpreting Physician: Nicki Guadalajara MD, ABSM Weight (lbs): 186 RPSGT: Cherylann Parr BMI: 30 MRN: [id number] Neck Size: 16.00  CLINICAL INFORMATION Sleep Study Type: Split Night CPAP  Indication for sleep study: Hypertension, OSA, Snoring, Uncontrolled Hypertension, Witnessed Apneas  Epworth Sleepiness Score: 1  SLEEP STUDY TECHNIQUE As per the AASM Manual for the Scoring of Sleep and Associated Events v2.3 (April 2016) with a hypopnea requiring 4% desaturations.  The channels recorded and monitored were frontal, central and occipital EEG, electrooculogram (EOG), submentalis EMG (chin), nasal and oral airflow, thoracic and abdominal wall motion, anterior tibialis EMG, snore microphone, electrocardiogram, and pulse oximetry. Continuous positive airway pressure (CPAP) was initiated when the patient met split night criteria and was titrated according to treat sleep-disordered breathing.  MEDICATIONS acetaminophen (TYLENOL) 500 MG tablet aspirin 81 MG tablet Cholecalciferol (VITAMIN D) 2000 UNITS CAPS ezetimibe (ZETIA) 10 MG tablet lisinopril (ZESTRIL) 20 MG tablet meloxicam (MOBIC) 15 MG tablet pantoprazole (PROTONIX) 40 MG tablet rosuvastatin (CRESTOR) 40 MG tablet Vitamin D, Ergocalciferol, (DRISDOL) 1.25 MG (50000 UNIT) CAPS capsule Medications self-administered by patient taken the night of the study : N/A  RESPIRATORY PARAMETERS Diagnostic Total AHI (/hr): 36.0 RDI (/hr): 41.8 OA Index (/hr): 21.6 CA Index (/hr): 0.0 REM AHI (/hr): N/A NREM AHI (/hr): 36.0 Supine AHI (/hr): 76.4 Non-supine AHI (/hr): 27.4 Min O2 Sat (%): 80.0 Mean O2 (%): 91.5 Time below 88% (min): 7   Titration Optimal Pressure (cm): 11 AHI at Optimal Pressure (/hr): 0 Min O2 at Optimal Pressure (%): 93.0 Supine % at Optimal (%): 60 Sleep %  at Optimal (%): 99   SLEEP ARCHITECTURE The recording time for the entire night was 366.7 minutes.  During a baseline period of 163.6 minutes, the patient slept for 124.9 minutes in REM and nonREM, yielding a sleep efficiency of 76.3%. Sleep onset after lights out was 22.7 minutes with a REM latency of N/A minutes. The patient spent 5.6% of the night in stage N1 sleep, 94.4% in stage N2 sleep, 0.0% in stage N3 and 0% in REM.  During the titration period of 197.9 minutes, the patient slept for 152.9 minutes in REM and nonREM, yielding a sleep efficiency of 77.3%. Sleep onset after CPAP initiation was 37.5 minutes with a REM latency of 36.0 minutes. The patient spent 2.6% of the night in stage N1 sleep, 61.1% in stage N2 sleep, 0.0% in stage N3 and 36.3% in REM.  CARDIAC DATA The 2 lead EKG demonstrated sinus rhythm. The mean heart rate was 100.0 beats per minute. Other EKG findings include: None.  LEG MOVEMENT DATA The total Periodic Limb Movements of Sleep (PLMS) were 0. The PLMS index was 0.0 .  IMPRESSIONS - Severe obstructive sleep apnea occurred during the diagnostic portion of the study (AHI 36.0/h; RDI 41.8/h; supine AHI 76.4/h).  REM sleep was absent during the diagnostic evaluation.  CPAP was initiated at 5 cm and was titrated to 9 cm (AHI 0.8) and 11 cm of water (AHI 0).  - The patient had moderate oxygen desaturation during the diagnostic portion of the study to a nadir of 80%. - No snoring was audible during the diagnostic portion of the study. - No cardiac abnormalities were noted during this study. - Clinically significant periodic limb movements did not occur during sleep.  DIAGNOSIS - Obstructive Sleep Apnea (G47.33)  RECOMMENDATIONS - Recommend an initial trial  of CPAP Auto therapy with EPR of 3 and 9 - 18 cm of water with heated humidification. A Small size Resmed Nasal Pillow AirFit P30 mask was used for the titration. - Effort should be made to optimize nasal and  oropharyngeal patency. - The patient should avoid supine sleep.  - Avoid alcohol, sedatives and other CNS depressants that may worsen sleep apnea and disrupt normal sleep architecture. - Sleep hygiene should be reviewed to assess factors that may improve sleep quality. - Weight management (BMI 30) and regular exercise should be initiated or continued. - Recommend a download and sleep clinic evaluation after 4 - 6 weeks of therapy.  [Electronically signed] 01/17/2023 12:05 PM  Nicki Guadalajara MD, [facility], ABSM Diplomate, American Board of Sleep Medicine NPI: [id number]  [facility] PH: [phone]   FX: [phone] ACCREDITED BY THE AMERICAN ACADEMY OF SLEEP MEDICINE

## 2023-01-28 ENCOUNTER — Encounter: Payer: Self-pay | Admitting: Cardiology

## 2023-02-04 ENCOUNTER — Telehealth: Payer: Self-pay | Admitting: Cardiology

## 2023-02-04 NOTE — Telephone Encounter (Signed)
  Wife is calling to follow up on sleep study results. She states patient needs a machine and they would like to know where that stands.

## 2023-02-04 NOTE — Telephone Encounter (Signed)
Spoke to with of patient and advised message would be sent to sleep coordinator and provider.

## 2023-02-05 NOTE — Telephone Encounter (Signed)
Rollene Rotunda, MD  Freddi Starr, RN16 hours ago (5:17 PM)   Hi,  Can you check with sleep med folks.  This is the second time he is calling and that I have asked where he is with this.  Can they make this a priority.  Thanks.    Routed to sleep studies team

## 2023-02-07 NOTE — Addendum Note (Signed)
Addended by: Brunetta Genera on: 02/07/2023 01:33 PM   Modules accepted: Orders

## 2023-02-13 NOTE — Telephone Encounter (Signed)
PER BRANDIE, Gregg Morgan, your order has been sent to [facility] and they will reach out to you within a few days. I apologize for the delay.

## 2023-02-28 ENCOUNTER — Ambulatory Visit: Payer: Commercial Managed Care - PPO | Admitting: Gastroenterology

## 2023-04-01 IMAGING — CT CT ABD-PELV W/ CM
2 of 5 series · 15 of 46 positions shown, 17 images · IV contrast (Omnipaque)
Comparison: None.

CLINICAL DATA: Left lower quadrant abdominal pain x1 day.

EXAM:
CT ABDOMEN AND PELVIS WITH CONTRAST
TECHNIQUE: Multidetector CT imaging of the abdomen and pelvis was performed
using the standard protocol following bolus administration of
intravenous contrast.
CONTRAST:  85mL OMNIPAQUE IOHEXOL 350 MG/ML SOLN

[Series 2: axial st · axial · 0.97mm/px · z∈[-600,-80]mm · 12 of 118 slices shown, 14 images]
[im 7/118  soft-tissue]
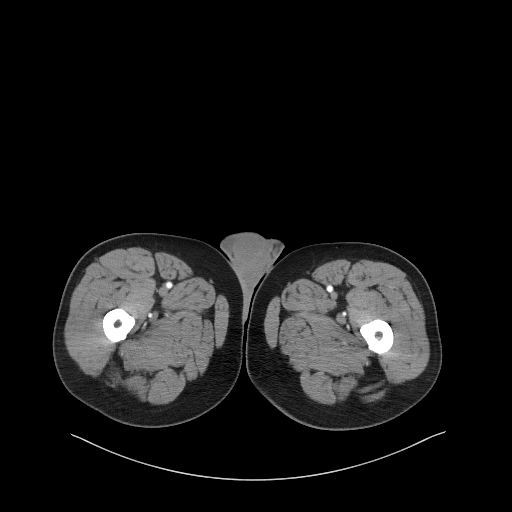
[im 7/118  bone]
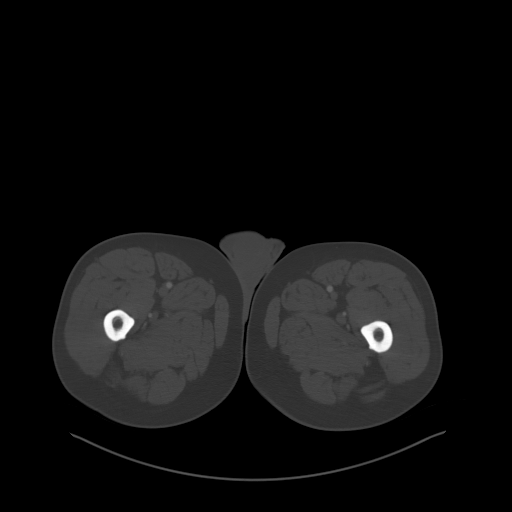
[im 19/118  soft-tissue]
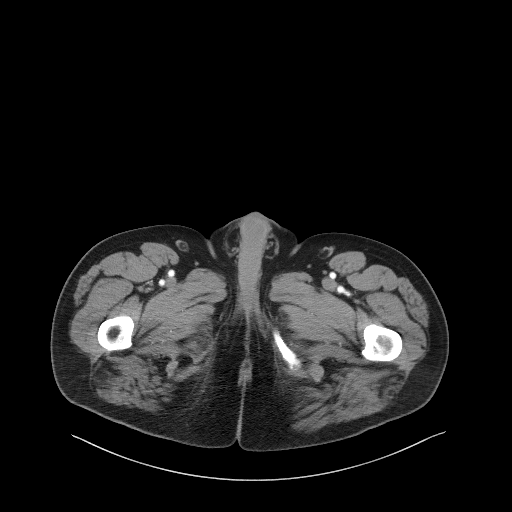
[im 25/118  soft-tissue]
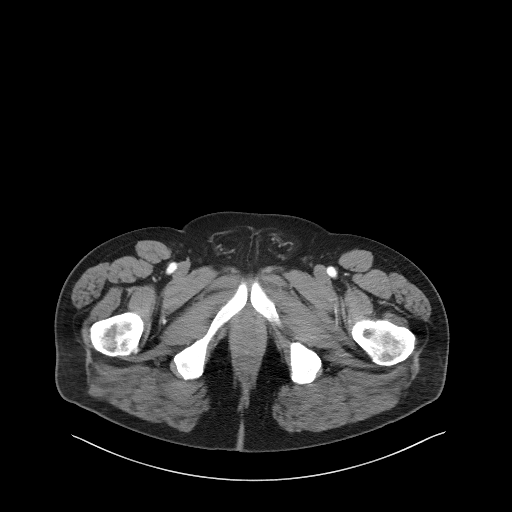
[im 37/118  soft-tissue]
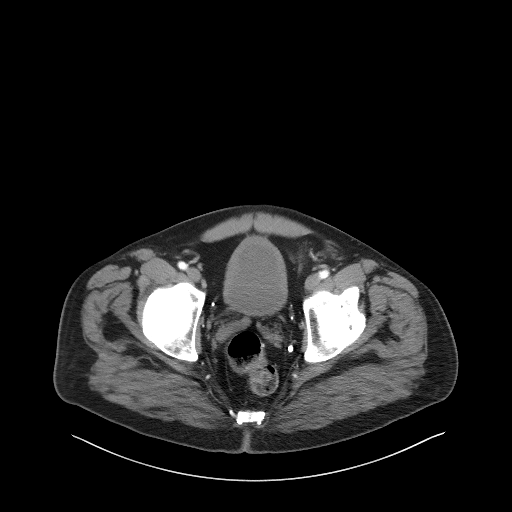
[im 44/118  soft-tissue]
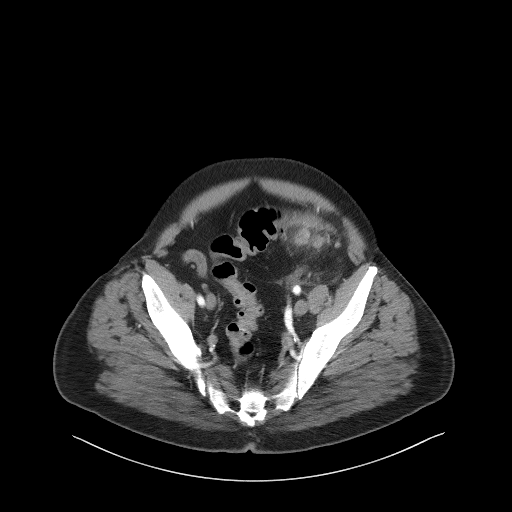
[im 56/118  soft-tissue]
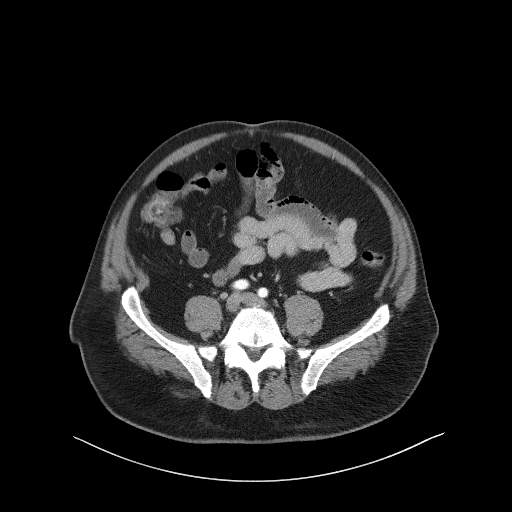
[im 62/118  soft-tissue]
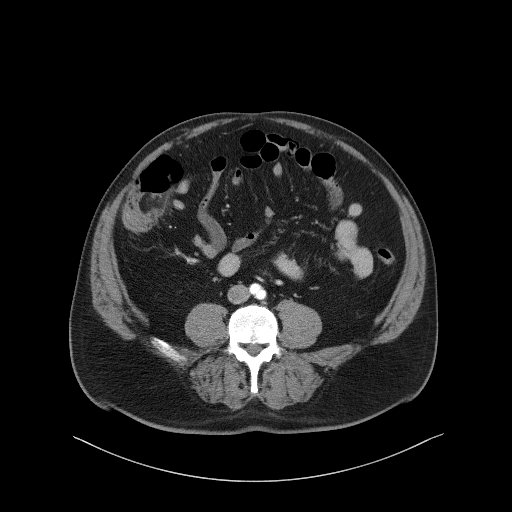
[im 74/118  soft-tissue]
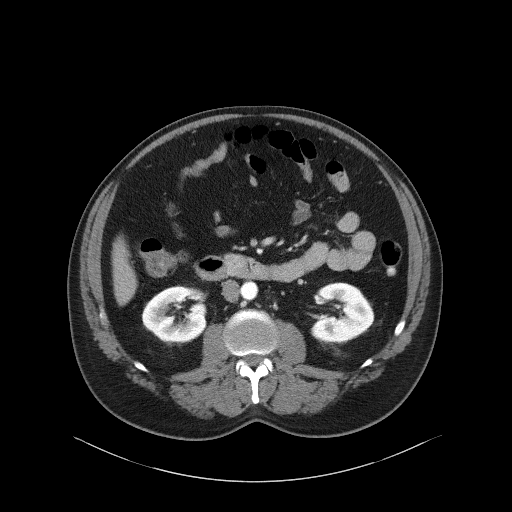
[im 81/118  soft-tissue]
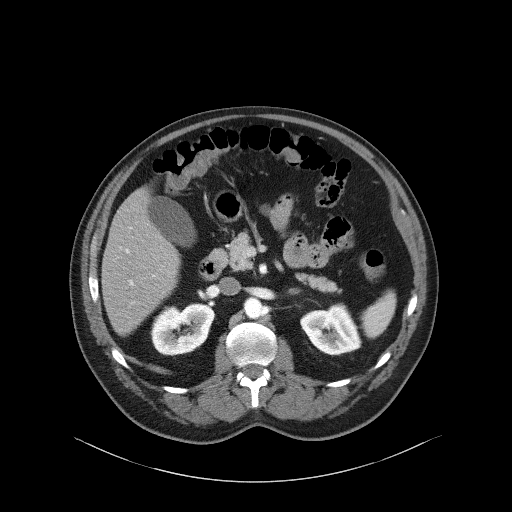
[im 81/118  bone]
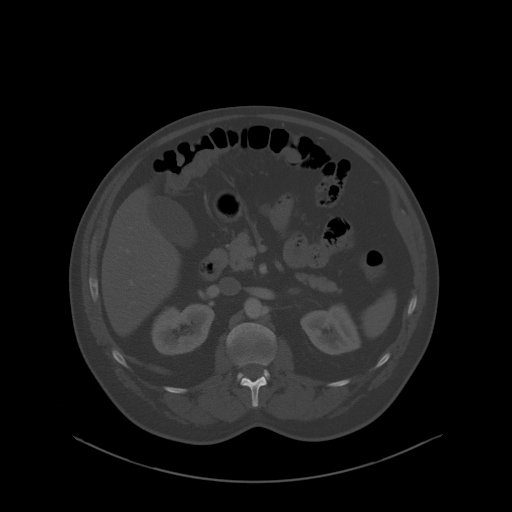
[im 93/118  soft-tissue]
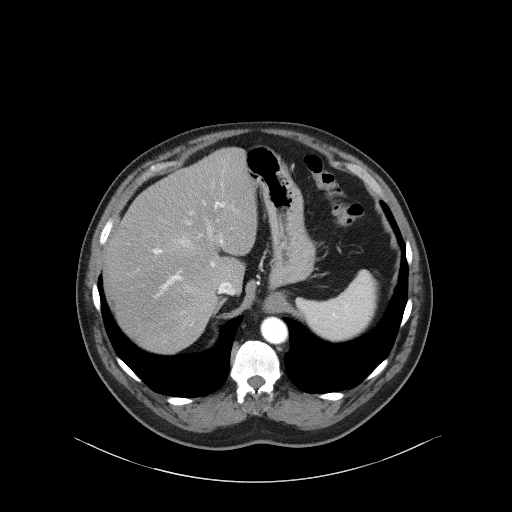
[im 99/118  soft-tissue]
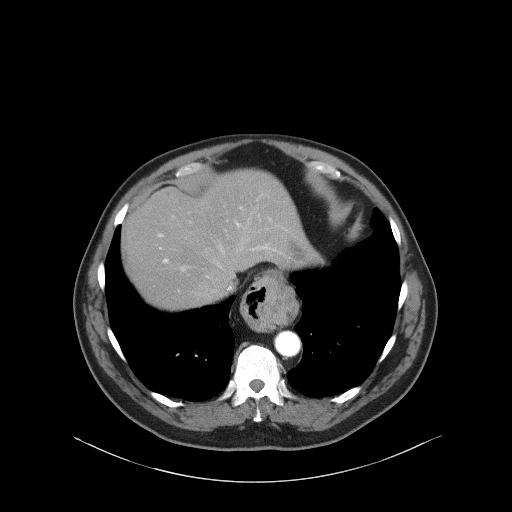
[im 111/118  soft-tissue]
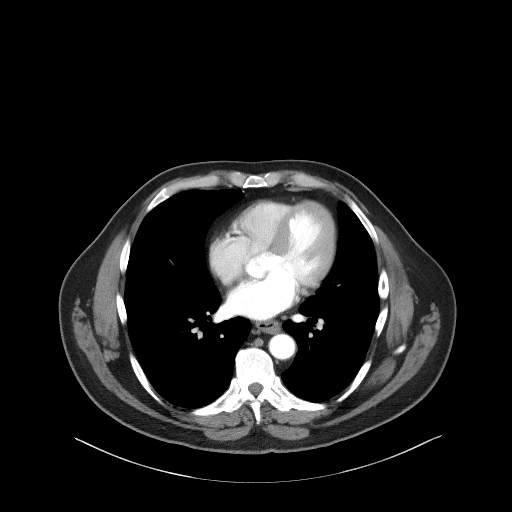

[Series 5: coronal st · coronal · 0.80mm/px · 3 of 116 slices shown]
[im 39/116  soft-tissue]
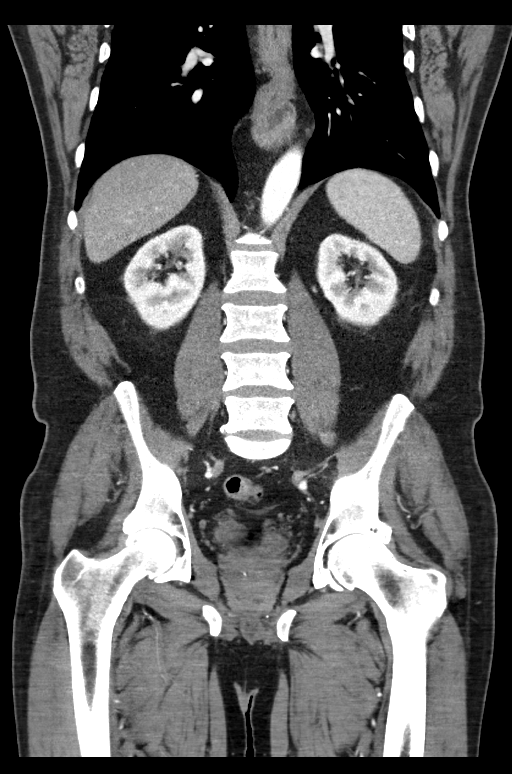
[im 52/116  soft-tissue]
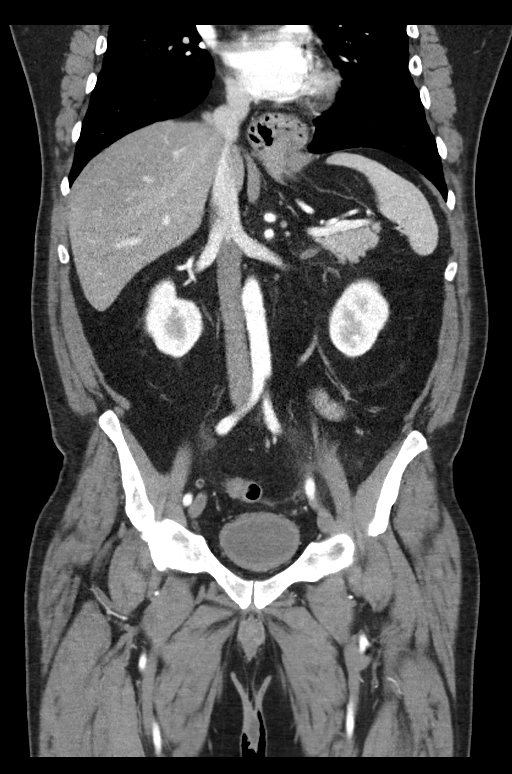
[im 64/116  soft-tissue]
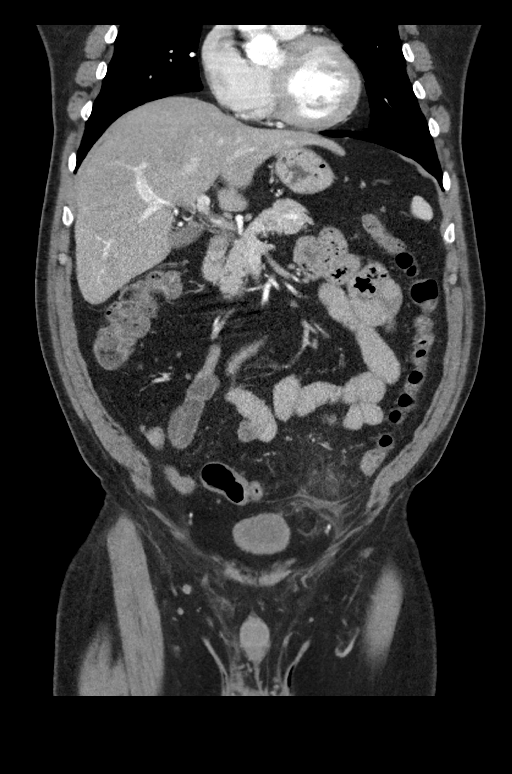

[15 of 46 positions shown; findings below may reference images not displayed]

FINDINGS: Lower chest: No acute abnormality.  Moderate hiatal hernia.

Hepatobiliary: Diffuse hepatic steatosis. No suspicious hepatic
lesion. Gallbladder is unremarkable. No biliary ductal dilation.

Pancreas: Within normal limits.

Spleen: Within normal limits.

Adrenals/Urinary Tract: Adrenal glands are unremarkable. Kidneys are
normal, without renal calculi, solid enhancing lesion, or
hydronephrosis. Mild symmetric wall thickening of an incompletely
distended urinary bladder.

Stomach/Bowel: Moderate hiatal hernia. No pathologic dilation of
small or large bowel. Left-sided colonic diverticulosis with 2
inflamed sigmoid colonic diverticula with adjacent wall thickening
and inflammatory stranding. No pneumoperitoneum. No walled off fluid
collections.

Vascular/Lymphatic: No abdominal aortic aneurysm. No pathologically
enlarged abdominal or pelvic lymph nodes.

Reproductive: Mild prostatic enlargement.

Other: Trace free fluid in the left lower quadrant/hemipelvis. No
walled off fluid collections. No pneumoperitoneum.

Musculoskeletal: Multilevel degenerative changes spine. Degenerative
changes bilateral hips. No acute osseous abnormality.
IMPRESSION: 1. Acute uncomplicated sigmoid diverticulitis. No evidence of
perforation or abscess formation.
2. Moderate hiatal hernia.
3. Diffuse hepatic steatosis.

## 2023-04-03 ENCOUNTER — Ambulatory Visit: Payer: Commercial Managed Care - PPO | Admitting: Gastroenterology

## 2023-04-03 ENCOUNTER — Encounter: Payer: Self-pay | Admitting: Gastroenterology

## 2023-04-03 VITALS — BP 124/70 | HR 62 | Ht 67.0 in | Wt 194.0 lb

## 2023-04-03 DIAGNOSIS — Z800 Family history of malignant neoplasm of digestive organs: Secondary | ICD-10-CM | POA: Diagnosis not present

## 2023-04-03 DIAGNOSIS — Z8719 Personal history of other diseases of the digestive system: Secondary | ICD-10-CM | POA: Diagnosis not present

## 2023-04-03 DIAGNOSIS — K219 Gastro-esophageal reflux disease without esophagitis: Secondary | ICD-10-CM

## 2023-04-03 MED ORDER — NA SULFATE-K SULFATE-MG SULF 17.5-3.13-1.6 GM/177ML PO SOLN: 1.0000 | Freq: Once | ORAL | 0 refills | Status: AC

## 2023-04-03 NOTE — Progress Notes (Signed)
04/03/2023 Gregg Morgan [id number] 1961-02-21   HISTORY OF PRESENT ILLNESS: This is a 62 year old male who was previously a patient of Dr. Marzetta Board with last procedures in 2012.  He was actually scheduled for colonoscopy with Dr. Barron Alvine in 2022, but had an episode of diverticulitis so had to cancel.  Anyway, he has not had any procedures since 2012.  He has family history of colon cancer in his father who was diagnosed at less than age 32.  Patient has had issues with recurrent diverticulitis over the years.  Fortunately has not had any issues in a year or so now.  He also has longstanding history of GERD.  He is on pantoprazole 40 mg twice daily.  Has history of ulcer remotely.  Does not use NSAIDs.  Overall he feels like his reflux is well-controlled with only occasional symptoms if he eats certain foods too late at night, etc.  No dysphagia.   Past Medical History:  Diagnosis Date   Arthritis    right knee   Coronary artery disease    GERD (gastroesophageal reflux disease)    Heart murmur    Hyperlipidemia    Hypertension    Pericarditis    Sleep apnea    uses a C-pap   Past Surgical History:  Procedure Laterality Date   CARDIAC CATHETERIZATION     09/2009--DR  BERRY   COLONOSCOPY     FINGER SURGERY     ORIF FINGER FRACTURE  08/03/2011   Procedure: OPEN REDUCTION INTERNAL FIXATION (ORIF) METACARPAL (FINGER) FRACTURE;  Surgeon: Gregg Covert, MD;  Location: [facility] OR;  Service: Orthopedics;  Laterality: Right;  RIGHT THUMB ORIF   UPPER GASTROINTESTINAL ENDOSCOPY  2012   Gregg Morgan    reports that he quit smoking about 7 years ago. His smoking use included cigarettes. He started smoking about 27 years ago. He has a 10 pack-year smoking history. He has never used smokeless tobacco. He reports current alcohol use of about 2.0 standard drinks of alcohol per week. He reports that he does not use drugs. family history includes Colon cancer in his father; Hypertension in  his mother; Other in his mother; Thyroid disease in his mother. No Known Allergies    Outpatient Encounter Medications as of 04/03/2023  Medication Sig   acetaminophen (TYLENOL) 500 MG tablet Take 500 mg by mouth every 6 (six) hours as needed.   aspirin 81 MG tablet Take 81 mg by mouth at bedtime.   ezetimibe (ZETIA) 10 MG tablet Take 1 tablet (10 mg total) by mouth daily.   lisinopril (ZESTRIL) 20 MG tablet Take 1 tablet (20 mg total) by mouth daily.   meloxicam (MOBIC) 15 MG tablet Take 1 tablet (15 mg total) by mouth daily. (Patient taking differently: Take 15 mg by mouth as needed.)   pantoprazole (PROTONIX) 40 MG tablet Take 1 tablet (40 mg total) by mouth 2 (two) times daily.   rosuvastatin (CRESTOR) 40 MG tablet Take 1 tablet (40 mg total) by mouth daily.   Cholecalciferol (VITAMIN D) 2000 UNITS CAPS Take 1 capsule by mouth at bedtime. (Patient not taking: Reported on 04/03/2023)   [DISCONTINUED] Vitamin D, Ergocalciferol, (DRISDOL) 1.25 MG (50000 UNIT) CAPS capsule Take 1 capsule (50,000 Units total) by mouth every 7 (seven) days. (Patient not taking: Reported on 10/02/2022)   No facility-administered encounter medications on file as of 04/03/2023.    REVIEW OF SYSTEMS  : All other systems reviewed and negative except where noted in the  History of Present Illness.   PHYSICAL EXAM: BP 124/70   Pulse 62   Ht 5\' 7"  (1.702 m)   Wt 194 lb (88 kg)   BMI 30.38 kg/m  General: Well developed white male in no acute distress Head: Normocephalic and atraumatic Eyes:  Sclerae anicteric, conjunctiva pink. Ears: Normal auditory acuity Lungs: Clear throughout to auscultation; no W/R/R. Heart: Regular rate and rhythm; no M/R/G. Abdomen: Soft, non-distended.  BS present.  Non-tender. Rectal:  Will be done at the time of colonoscopy. Musculoskeletal: Symmetrical with no gross deformities  Skin: No lesions on visible extremities Neurological: Alert oriented x 4, grossly  non-focal Psychological:  Alert and cooperative. Normal mood and affect  ASSESSMENT AND PLAN: *Family history of colon cancer in his father: His last colonoscopy was in 2012.  His father was diagnosed before age 70.  Should be getting colonoscopy every 5 years.  Will schedule colonoscopy with Dr. Barron Alvine. *Recurrent diverticulitis: Fortunately no issues in at least the past year or so. *Longstanding GERD: Says that he has had an EGD in the past and has a history of ulcer.  He is on pantoprazole 40 mg twice daily and overall feels like that controls his symptoms well with only occasional issues, but it has been several years since he had an EGD performed.  Will schedule that with Dr. Barron Alvine as well to evaluate for good control, rule out Barrett's, etc.  **The risks, benefits, and alternatives to EGD and colonoscopy were discussed with the patient and he consents to proceed.  CC:  Mechele Claude, MD

## 2023-04-03 NOTE — Patient Instructions (Signed)
You have been scheduled for an endoscopy and colonoscopy. Please follow the written instructions given to you at your visit today.  Please pick up your prep supplies at the pharmacy within the next 1-3 days.  If you use inhalers (even only as needed), please bring them with you on the day of your procedure.  DO NOT TAKE 7 DAYS PRIOR TO TEST- Trulicity (dulaglutide) Ozempic, Wegovy (semaglutide) Mounjaro (tirzepatide) Bydureon Bcise (exanatide extended release)  DO NOT TAKE 1 DAY PRIOR TO YOUR TEST Rybelsus (semaglutide) Adlyxin (lixisenatide) Victoza (liraglutide) Byetta (exanatide) ______________________________________________________________________  _______________________________________________________  If your blood pressure at your visit was 140/90 or greater, please contact your primary care physician to follow up on this.  _______________________________________________________  If you are age 21 or older, your body mass index should be between 23-30. Your Body mass index is 30.38 kg/m. If this is out of the aforementioned range listed, please consider follow up with your Primary Care Provider.  If you are age 81 or younger, your body mass index should be between 19-25. Your Body mass index is 30.38 kg/m. If this is out of the aformentioned range listed, please consider follow up with your Primary Care Provider.   ________________________________________________________  The [facility] providers would like to encourage you to use [facility] to communicate with providers for non-urgent requests or questions.  Due to long hold times on the telephone, sending your provider a message by [facility] may be a faster and more efficient way to get a response.  Please allow 48 business hours for a response.  Please remember that this is for non-urgent requests.  _______________________________________________________

## 2023-05-05 ENCOUNTER — Telehealth: Payer: Self-pay

## 2023-05-05 NOTE — Telephone Encounter (Signed)
Spoke with patient's wife, message sent to [facility] for compliance appointment.

## 2023-05-28 ENCOUNTER — Encounter: Payer: Self-pay | Admitting: Cardiovascular Disease

## 2023-05-28 ENCOUNTER — Ambulatory Visit: Payer: Commercial Managed Care - PPO | Attending: Cardiovascular Disease | Admitting: Cardiovascular Disease

## 2023-05-28 DIAGNOSIS — I422 Other hypertrophic cardiomyopathy: Secondary | ICD-10-CM | POA: Diagnosis not present

## 2023-05-28 DIAGNOSIS — G4733 Obstructive sleep apnea (adult) (pediatric): Secondary | ICD-10-CM | POA: Diagnosis not present

## 2023-05-28 DIAGNOSIS — E785 Hyperlipidemia, unspecified: Secondary | ICD-10-CM

## 2023-05-28 DIAGNOSIS — I25119 Atherosclerotic heart disease of native coronary artery with unspecified angina pectoris: Secondary | ICD-10-CM | POA: Diagnosis not present

## 2023-05-28 DIAGNOSIS — I10 Essential (primary) hypertension: Secondary | ICD-10-CM

## 2023-05-28 NOTE — Progress Notes (Unsigned)
Cardiology Office Note    Date:  05/28/2023   ID:  Gregg Morgan, DOB 06-Sep-1960, MRN [id number]  PCP:  Gregg Claude, MD  Cardiologist:  Gregg Guadalajara, MD   No chief complaint on file.   History of Present Illness:  Gregg Morgan is a 63 y.o. male ***    Past Medical History:  Diagnosis Date   Arthritis    right knee   Coronary artery disease    GERD (gastroesophageal reflux disease)    Heart murmur    Hyperlipidemia    Hypertension    Pericarditis    Sleep apnea    uses a C-pap    Past Surgical History:  Procedure Laterality Date   CARDIAC CATHETERIZATION     09/2009--DR  BERRY   COLONOSCOPY     FINGER SURGERY     ORIF FINGER FRACTURE  08/03/2011   Procedure: OPEN REDUCTION INTERNAL FIXATION (ORIF) METACARPAL (FINGER) FRACTURE;  Surgeon: Sharma Covert, MD;  Location: [facility] OR;  Service: Orthopedics;  Laterality: Right;  RIGHT THUMB ORIF   UPPER GASTROINTESTINAL ENDOSCOPY  2012   [name]    Current Medications: Outpatient Medications Prior to Visit  Medication Sig Dispense Refill   acetaminophen (TYLENOL) 500 MG tablet Take 500 mg by mouth every 6 (six) hours as needed.     aspirin 81 MG tablet Take 81 mg by mouth at bedtime.     Cholecalciferol (VITAMIN D) 2000 UNITS CAPS Take 1 capsule by mouth at bedtime.     ezetimibe (ZETIA) 10 MG tablet Take 1 tablet (10 mg total) by mouth daily. 90 tablet 3   lisinopril (ZESTRIL) 20 MG tablet Take 1 tablet (20 mg total) by mouth daily. 90 tablet 3   meloxicam (MOBIC) 15 MG tablet Take 1 tablet (15 mg total) by mouth daily. (Patient taking differently: Take 15 mg by mouth as needed.) 30 tablet 5   pantoprazole (PROTONIX) 40 MG tablet Take 1 tablet (40 mg total) by mouth 2 (two) times daily. 180 tablet 3   rosuvastatin (CRESTOR) 40 MG tablet Take 1 tablet (40 mg total) by mouth daily. 90 tablet 3   No facility-administered medications prior to visit.     Allergies:   Patient has no known allergies.    Social History   Socioeconomic History   Marital status: Married    Spouse name: Marcelino Duster   Number of children: 1   Years of education: 12   Highest education level: Not on file  Occupational History   Occupation: Location manager    Employer: [employer]   Occupation: Merchandiser, retail - Technical sales engineer  Tobacco Use   Smoking status: Former    Current packs/day: 0.00    Average packs/day: 0.5 packs/day for 20.0 years (10.0 ttl pk-yrs)    Types: Cigarettes    Start date: 03/12/1996    Quit date: 03/12/2016    Years since quitting: 7.2   Smokeless tobacco: Never  Vaping Use   Vaping status: Some Days  Substance and Sexual Activity   Alcohol use: Yes    Alcohol/week: 2.0 standard drinks of alcohol    Types: 2 Cans of beer per week    Comment: Occasion on weekends   Drug use: No   Sexual activity: Not on file  Other Topics Concern   Not on file  Social History Narrative   Married, lives with wife and daughter.  One child.  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    Social Drivers of Health  Financial Resource Strain: Not on file  Food Insecurity: Not on file  Transportation Needs: Not on file  Physical Activity: Not on file  Stress: Not on file  Social Connections: Not on file     Family History:  The patient's ***family history includes Colon cancer in his father; Hypertension in his mother; Other in his mother; Thyroid disease in his mother.   ROS General: Negative; No fevers, chills, or night sweats;  HEENT: Negative; No changes in vision or hearing, sinus congestion, difficulty swallowing Pulmonary: Negative; No cough, wheezing, shortness of breath, hemoptysis Cardiovascular: Negative; No chest pain, presyncope, syncope, palpitations GI: Negative; No nausea, vomiting, diarrhea, or abdominal pain GU: Negative; No dysuria, hematuria, or difficulty voiding Musculoskeletal: Negative; no myalgias, joint pain, or weakness Hematologic/Oncology: Negative; no easy bruising,  bleeding Endocrine: Negative; no heat/cold intolerance; no diabetes Neuro: Negative; no changes in balance, headaches Skin: Negative; No rashes or skin lesions Psychiatric: Negative; No behavioral problems, depression Sleep: Negative; No snoring, daytime sleepiness, hypersomnolence, bruxism, restless legs, hypnogognic hallucinations, no cataplexy Other comprehensive 14 point system review is negative.   PHYSICAL EXAM:   VS:  BP 132/68 (BP Location: Right Arm, Patient Position: Sitting, Cuff Size: Normal)   Pulse 62   Ht 5\' 6"  (1.676 m)   Wt 197 lb 9.6 oz (89.6 kg)   SpO2 98%   BMI 31.89 kg/m    Wt Readings from Last 3 Encounters:  05/28/23 197 lb 9.6 oz (89.6 kg)  04/03/23 194 lb (88 kg)  01/05/23 186 lb (84.4 kg)    General: Alert, oriented, no distress.  Skin: normal turgor, no rashes, warm and dry HEENT: Normocephalic, atraumatic. Pupils equal round and reactive to light; sclera anicteric; extraocular muscles intact; Fundi ** Nose without nasal septal hypertrophy Mouth/Parynx benign; Mallinpatti scale Neck: No JVD, no carotid bruits; normal carotid upstroke Lungs: clear to ausculatation and percussion; no wheezing or rales Chest wall: without tenderness to palpitation Heart: PMI not displaced, RRR, s1 s2 normal, 1/6 systolic murmur, no diastolic murmur, no rubs, gallops, thrills, or heaves Abdomen: soft, nontender; no hepatosplenomehaly, BS+; abdominal aorta nontender and not dilated by palpation. Back: no CVA tenderness Pulses 2+ Musculoskeletal: full range of motion, normal strength, no joint deformities Extremities: no clubbing cyanosis or edema, Homan's sign negative  Neurologic: grossly nonfocal; Cranial nerves grossly wnl Psychologic: Normal mood and affect   Studies/Labs Reviewed:   EKG:  EKG is*** ordered today.  The ekg ordered today demonstrates ***  Recent Labs:    Latest Ref Rng & Units 12/11/2022    3:24 PM 07/10/2022    2:51 PM 11/20/2021    2:45 PM  BMP   Glucose 70 - 99 mg/dL 90  87  92   BUN 8 - 27 mg/dL 10  9  9    Creatinine 0.76 - 1.27 mg/dL 4.01  0.27  2.53   BUN/Creat Ratio 10 - 24 11  9  10    Sodium 134 - 144 mmol/L 139  142  144   Potassium 3.5 - 5.2 mmol/L 4.4  4.7  4.8   Chloride 96 - 106 mmol/L 102  102  103   CO2 20 - 29 mmol/L 22  18  21    Calcium 8.6 - 10.2 mg/dL 66.4  9.7  9.9         Latest Ref Rng & Units 12/11/2022    3:24 PM 07/10/2022    2:51 PM 11/20/2021    2:45 PM  Hepatic Function  Total Protein  6.0 - 8.5 g/dL 7.0  6.9  7.2   Albumin 3.9 - 4.9 g/dL 4.8  4.8  4.9   AST 0 - 40 IU/L 27  21  21    ALT 0 - 44 IU/L 31  27  23    Alk Phosphatase 44 - 121 IU/L 83  95  96   Total Bilirubin 0.0 - 1.2 mg/dL 0.7  0.6  0.4        Latest Ref Rng & Units 12/11/2022    3:24 PM 07/10/2022    2:51 PM 11/20/2021    2:45 PM  CBC  WBC 3.4 - 10.8 x10E3/uL 5.7  7.3  6.9   Hemoglobin 13.0 - 17.7 g/dL 16.1  09.6  04.5   Hematocrit 37.5 - 51.0 % 45.7  46.2  47.7   Platelets 150 - 450 x10E3/uL 231  227  260    Lab Results  Component Value Date   MCV 92 12/11/2022   MCV 94 07/10/2022   MCV 92 11/20/2021   Lab Results  Component Value Date   TSH 2.210 11/29/2014   Lab Results  Component Value Date   HGBA1C  10/09/2009    5.6 (NOTE)                                                                       According to the ADA Clinical Practice Recommendations for 2011, when HbA1c is used as a screening test:   >=6.5%   Diagnostic of Diabetes Mellitus           (if abnormal result  is confirmed)  5.7-6.4%   Increased risk of developing Diabetes Mellitus  References:Diagnosis and Classification of Diabetes Mellitus,Diabetes Care,2011,34(Suppl 1):S62-S69 and Standards of Medical Care in         Diabetes - 2011,Diabetes Care,2011,34  (Suppl 1):S11-S61.     BNP No results found for: "BNP"  ProBNP No results found for: "PROBNP"   Lipid Panel     Component Value Date/Time   CHOL 165 12/11/2022 1524   TRIG 145 12/11/2022 1524    HDL 67 12/11/2022 1524   CHOLHDL 2.5 12/11/2022 1524   CHOLHDL 5.2 10/09/2009 1325   VLDL 16 10/09/2009 1325   LDLCALC 73 12/11/2022 1524   LABVLDL 25 12/11/2022 1524     RADIOLOGY: No results found.   Additional studies/ records that were reviewed today include:  ***    ASSESSMENT:    1. Primary hypertension   2. OSA (obstructive sleep apnea)      PLAN:  ***   Medication Adjustments/Labs and Tests Ordered: Current medicines are reviewed at length with the patient today.  Concerns regarding medicines are outlined above.  Medication changes, Labs and Tests ordered today are listed in the Patient Instructions below. There are no Patient Instructions on file for this visit.   Signed, Gregg Guadalajara, MD,FACC, ABSM Diplomate, American Board of Sleep Medicine  05/28/2023 12:33 PM    [facility] [address], [place], [place]  [address] Phone: [phone]

## 2023-05-28 NOTE — Patient Instructions (Addendum)
Medication Instructions:  No medication changes were made during today's visit  *If you need a refill on your cardiac medications before your next appointment, please call your pharmacy*   Lab Work: No labs were ordered during today's visit.  If you have labs (blood work) drawn today and your tests are completely normal, you will receive your results only by: MyChart Message (if you have MyChart) OR A paper copy in the mail If you have any lab test that is abnormal or we need to change your treatment, we will call you to review the results.   Testing/Procedures: No procedures ordered today.    Follow-Up: At [facility], you and your health needs are our priority.  As part of our continuing mission to provide you with exceptional heart care, we have created designated Provider Care Teams.  These Care Teams include your primary Cardiologist (physician) and Advanced Practice Providers (APPs -  Physician Assistants and Nurse Practitioners) who all work together to provide you with the care you need, when you need it.  We recommend signing up for the patient portal called "MyChart".  Sign up information is provided on this After Visit Summary.  MyChart is used to connect with patients for Virtual Visits (Telemedicine).  Patients are able to view lab/test results, encounter notes, upcoming appointments, etc.  Non-urgent messages can be sent to your provider as well.   To learn more about what you can do with MyChart, go to [email or web address].    Your next appointment:    As needed for sleep concerns  Provider:   Dr. Armanda Magic     Other Instructions If you have any questions or concerns regarding your c-pap, bi-pap or sleep accessories, please contact Brandie Rorie at [phone].

## 2023-05-30 ENCOUNTER — Encounter: Payer: Self-pay | Admitting: Gastroenterology

## 2023-05-30 ENCOUNTER — Encounter: Payer: Self-pay | Admitting: Cardiovascular Disease

## 2023-06-09 ENCOUNTER — Ambulatory Visit (AMBULATORY_SURGERY_CENTER): Payer: Commercial Managed Care - PPO | Admitting: Gastroenterology

## 2023-06-09 ENCOUNTER — Encounter: Payer: Self-pay | Admitting: Gastroenterology

## 2023-06-09 VITALS — BP 128/76 | HR 55 | Temp 98.1°F | Resp 18 | Ht 67.0 in | Wt 194.0 lb

## 2023-06-09 DIAGNOSIS — K635 Polyp of colon: Secondary | ICD-10-CM | POA: Diagnosis not present

## 2023-06-09 DIAGNOSIS — K219 Gastro-esophageal reflux disease without esophagitis: Secondary | ICD-10-CM | POA: Diagnosis not present

## 2023-06-09 DIAGNOSIS — K2289 Other specified disease of esophagus: Secondary | ICD-10-CM

## 2023-06-09 DIAGNOSIS — D175 Benign lipomatous neoplasm of intra-abdominal organs: Secondary | ICD-10-CM

## 2023-06-09 DIAGNOSIS — K449 Diaphragmatic hernia without obstruction or gangrene: Secondary | ICD-10-CM

## 2023-06-09 DIAGNOSIS — K222 Esophageal obstruction: Secondary | ICD-10-CM

## 2023-06-09 DIAGNOSIS — K5730 Diverticulosis of large intestine without perforation or abscess without bleeding: Secondary | ICD-10-CM | POA: Diagnosis not present

## 2023-06-09 DIAGNOSIS — Z800 Family history of malignant neoplasm of digestive organs: Secondary | ICD-10-CM | POA: Diagnosis not present

## 2023-06-09 DIAGNOSIS — Z1211 Encounter for screening for malignant neoplasm of colon: Secondary | ICD-10-CM

## 2023-06-09 DIAGNOSIS — K641 Second degree hemorrhoids: Secondary | ICD-10-CM

## 2023-06-09 DIAGNOSIS — D122 Benign neoplasm of ascending colon: Secondary | ICD-10-CM | POA: Diagnosis not present

## 2023-06-09 DIAGNOSIS — D125 Benign neoplasm of sigmoid colon: Secondary | ICD-10-CM

## 2023-06-09 MED ORDER — SODIUM CHLORIDE 0.9 % IV SOLN: 500.0000 mL | Freq: Once | INTRAVENOUS | Status: DC

## 2023-06-09 NOTE — Op Note (Signed)
 [facility] Patient Name: Gregg Morgan Procedure Date: 06/09/2023 9:28 AM MRN: [id number] Endoscopist: Doristine Locks , MD, [id number] Age: 63 Referring MD:  Date of Birth: Feb 21, 1961 Gender: Male Account #: [id number] Procedure:                Upper GI endoscopy Indications:              Dysphagia, Esophageal reflux, Screening for                            Barrett's esophagus Medicines:                Monitored Anesthesia Care Procedure:                Pre-Anesthesia Assessment:                           - Prior to the procedure, a History and Physical                            was performed, and patient medications and                            allergies were reviewed. The patient's tolerance of                            previous anesthesia was also reviewed. The risks                            and benefits of the procedure and the sedation                            options and risks were discussed with the patient.                            All questions were answered, and informed consent                            was obtained. Prior Anticoagulants: The patient has                            taken no anticoagulant or antiplatelet agents. ASA                            Grade Assessment: II - A patient with mild systemic                            disease. After reviewing the risks and benefits,                            the patient was deemed in satisfactory condition to                            undergo the procedure.  After obtaining informed consent, the endoscope was                            passed under direct vision. Throughout the                            procedure, the patient's blood pressure, pulse, and                            oxygen saturations were monitored continuously. The                            Olympus Scope [id number] was introduced through the                            mouth, and advanced to the second  part of duodenum.                            The upper GI endoscopy was accomplished without                            difficulty. The patient tolerated the procedure                            well. 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                           One benign-appearing, intrinsic mild stenosis was                            found at the gastroesophageal junction. This                            stenosis measured less than one cm (in length). The                            stenosis was traversed. A TTS dilator was passed                            through the scope. Dilation with an 18-19-20 mm                            balloon dilator was performed to 19 mm. The                            dilation site was examined and showed moderate  improvement in luminal narrowing.                           A 6 cm hiatal hernia was present.                           The entire examined stomach was normal.                           The examined duodenum was normal. Complications:            No immediate complications. Estimated Blood Loss:     Estimated blood loss was minimal. Impression:               - Ectopic gastric mucosa in the upper third of the                            esophagus.                           - Benign-appearing esophageal stenosis. Dilated                            with 19 mm TTS balloon.                           - 6 cm hiatal hernia.                           - Normal stomach.                           - Normal examined duodenum.                           - No specimens collected. Recommendation:           - Patient has a contact number available for                            emergencies. The signs and symptoms of potential                            delayed complications were discussed with the                             patient. Return to normal activities tomorrow.                            Written discharge instructions were provided to the                            patient.                           - Resume previous diet.                           -  Continue present medications.                           - Repeat upper endoscopy PRN for retreatment.                           - Return to GI office PRN. Doristine Locks, MD 06/09/2023 10:24:53 AM

## 2023-06-09 NOTE — Progress Notes (Signed)
 GASTROENTEROLOGY PROCEDURE H&P NOTE   Primary Care Physician: Mechele Claude, MD    Reason for Procedure:  Family history of colon cancer, diverticulosis with history of diverticulitis, GERD, Barrett's Esophagus screening  Plan:    EGD, colonoscopy  Patient is appropriate for endoscopic procedure(s) in the ambulatory ([facility]) setting.  The nature of the procedure, as well as the risks, benefits, and alternatives were carefully and thoroughly reviewed with the patient. Ample time for discussion and questions allowed. The patient understood, was satisfied, and agreed to proceed.     HPI: Gregg Morgan is a 63 y.o. male who presents for colonoscopy for ongoing colon cancer screening.  Last colonoscopy was 2012.  Family history notable for father with colon cancer diagnosed prior to age 69.  Patient does have a history of diverticulosis with history of diverticulitis in the past.  Separately, longstanding history of GERD.  Reflux symptoms generally well-controlled with pantoprazole 40 mg twice daily.  Presents today for Barrett's Esophagus screening and evaluation for erosive esophagitis, LES laxity, hiatal hernia.  Did have an EGD in 2012 which showed stricture at the GE junction dilated with 18 mm Maloney along with 4 cm sliding hiatal hernia.  Past Medical History:  Diagnosis Date   Arthritis    right knee   Coronary artery disease    GERD (gastroesophageal reflux disease)    Heart murmur    Hyperlipidemia    Hypertension    Pericarditis    Sleep apnea    uses a C-pap    Past Surgical History:  Procedure Laterality Date   CARDIAC CATHETERIZATION     09/2009--DR  BERRY   COLONOSCOPY     FINGER SURGERY     ORIF FINGER FRACTURE  08/03/2011   Procedure: OPEN REDUCTION INTERNAL FIXATION (ORIF) METACARPAL (FINGER) FRACTURE;  Surgeon: Sharma Covert, MD;  Location: [facility] OR;  Service: Orthopedics;  Laterality: Right;  RIGHT THUMB ORIF   UPPER GASTROINTESTINAL ENDOSCOPY  2012    [name]    Prior to Admission medications   Medication Sig Start Date End Date Taking? Authorizing Provider  acetaminophen (TYLENOL) 500 MG tablet Take 500 mg by mouth every 6 (six) hours as needed.    [provider]  aspirin 81 MG tablet Take 81 mg by mouth at bedtime.    [provider]  Cholecalciferol (VITAMIN D) 2000 UNITS CAPS Take 1 capsule by mouth at bedtime.    [provider]  ezetimibe (ZETIA) 10 MG tablet Take 1 tablet (10 mg total) by mouth daily. 10/02/22   Rollene Rotunda, MD  lisinopril (ZESTRIL) 20 MG tablet Take 1 tablet (20 mg total) by mouth daily. 07/10/22   Mechele Claude, MD  meloxicam (MOBIC) 15 MG tablet Take 1 tablet (15 mg total) by mouth daily. Patient taking differently: Take 15 mg by mouth as needed. 07/10/22   Mechele Claude, MD  pantoprazole (PROTONIX) 40 MG tablet Take 1 tablet (40 mg total) by mouth 2 (two) times daily. 12/11/22   Mechele Claude, MD  rosuvastatin (CRESTOR) 40 MG tablet Take 1 tablet (40 mg total) by mouth daily. 12/11/22   Mechele Claude, MD    Current Outpatient Medications  Medication Sig Dispense Refill   acetaminophen (TYLENOL) 500 MG tablet Take 500 mg by mouth every 6 (six) hours as needed.     aspirin 81 MG tablet Take 81 mg by mouth at bedtime.     Cholecalciferol (VITAMIN D) 2000 UNITS CAPS Take 1 capsule by mouth at  bedtime.     ezetimibe (ZETIA) 10 MG tablet Take 1 tablet (10 mg total) by mouth daily. 90 tablet 3   lisinopril (ZESTRIL) 20 MG tablet Take 1 tablet (20 mg total) by mouth daily. 90 tablet 3   meloxicam (MOBIC) 15 MG tablet Take 1 tablet (15 mg total) by mouth daily. (Patient taking differently: Take 15 mg by mouth as needed.) 30 tablet 5   pantoprazole (PROTONIX) 40 MG tablet Take 1 tablet (40 mg total) by mouth 2 (two) times daily. 180 tablet 3   rosuvastatin (CRESTOR) 40 MG tablet Take 1 tablet (40 mg total) by mouth daily. 90 tablet 3   No current facility-administered medications  for this visit.    Allergies as of 06/09/2023   (No Known Allergies)    Family History  Problem Relation Age of Onset   Thyroid disease Mother    Hypertension Mother    Other Mother        pre-diabetic   Colon cancer Father        dx in his 49 or 78's   Pancreatic cancer Neg Hx    Esophageal cancer Neg Hx    Liver cancer Neg Hx    Stomach cancer Neg Hx    Rectal cancer Neg Hx    Colon polyps Neg Hx     Social History   Socioeconomic History   Marital status: Married    Spouse name: Marcelino Duster   Number of children: 1   Years of education: 12   Highest education level: Not on file  Occupational History   Occupation: Location manager    Employer: [employer]   Occupation: Merchandiser, retail - Technical sales engineer  Tobacco Use   Smoking status: Former    Current packs/day: 0.00    Average packs/day: 0.5 packs/day for 20.0 years (10.0 ttl pk-yrs)    Types: Cigarettes    Start date: 03/12/1996    Quit date: 03/12/2016    Years since quitting: 7.2   Smokeless tobacco: Never  Vaping Use   Vaping status: Some Days  Substance and Sexual Activity   Alcohol use: Yes    Alcohol/week: 2.0 standard drinks of alcohol    Types: 2 Cans of beer per week    Comment: Occasion on weekends   Drug use: No   Sexual activity: Not on file  Other Topics Concern   Not on file  Social History Narrative   Married, lives with wife and daughter.  One child.  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    Social Drivers of Corporate investment banker Strain: Not on file  Food Insecurity: Not on file  Transportation Needs: Not on file  Physical Activity: Not on file  Stress: Not on file  Social Connections: Not on file  Intimate Partner Violence: Not on file    Physical Exam: Vital signs in last 24 hours: @There  were no vitals taken for this visit. GEN: NAD EYE: Sclerae anicteric ENT: MMM CV: Non-tachycardic Pulm: CTA b/l GI: Soft, NT/ND NEURO:  Alert & Oriented x 3   Doristine Locks, DO [facility]   06/09/2023 9:30 AM

## 2023-06-09 NOTE — Progress Notes (Signed)
0947 Robinul 0.1 mg IV given due large amount of secretions upon assessment.  MD made aware, vss  °

## 2023-06-09 NOTE — Op Note (Signed)
 [facility] Patient Name: Gregg Morgan Procedure Date: 06/09/2023 9:18 AM MRN: [id number] Endoscopist: Doristine Locks , MD, [id number] Age: 63 Referring MD:  Date of Birth: 1961-04-08 Gender: Male Account #: [id number] Procedure:                Colonoscopy Indications:              Screening in patient at increased risk: Colorectal                            cancer in father before age 39                           Last colonoscopy was >10 years ago. Medicines:                Monitored Anesthesia Care Procedure:                Pre-Anesthesia Assessment:                           - Prior to the procedure, a History and Physical                            was performed, and patient medications and                            allergies were reviewed. The patient's tolerance of                            previous anesthesia was also reviewed. The risks                            and benefits of the procedure and the sedation                            options and risks were discussed with the patient.                            All questions were answered, and informed consent                            was obtained. Prior Anticoagulants: The patient has                            taken no anticoagulant or antiplatelet agents. ASA                            Grade Assessment: II - A patient with mild systemic                            disease. After reviewing the risks and benefits,                            the patient was deemed in satisfactory condition to  undergo the procedure.                           After obtaining informed consent, the colonoscope                            was passed under direct vision. Throughout the                            procedure, the patient's blood pressure, pulse, and                            oxygen saturations were monitored continuously. The                            CF [id number] #[id number] was introduced through  the anus                            and advanced to the the cecum, identified by                            appendiceal orifice and ileocecal valve. The                            colonoscopy was performed without difficulty. The                            patient tolerated the procedure well. The quality                            of the bowel preparation was good. The ileocecal                            valve, appendiceal orifice, and rectum were                            photographed. Scope In: 10:01:52 AM Scope Out: 10:15:29 AM Scope Withdrawal Time: 0 hours 11 minutes 44 seconds  Total Procedure Duration: 0 hours 13 minutes 37 seconds  Findings:                 The perianal and digital rectal examinations were                            normal.                           A 4 mm polyp was found in the ascending colon. The                            polyp was sessile. The polyp was removed with a                            cold snare. Resection and retrieval were complete.  Estimated blood loss was minimal.                           A 3 mm polyp was found in the sigmoid colon. The                            polyp was sessile. The polyp was removed with a                            cold snare. Resection and retrieval were complete.                            Estimated blood loss was minimal.                           There was a medium-sized lipoma, in the transverse                            colon.                           Multiple medium-mouthed and small-mouthed                            diverticula were found in the sigmoid colon.                           Non-bleeding internal hemorrhoids were found during                            retroflexion. The hemorrhoids were small and Grade                            II (internal hemorrhoids that prolapse but reduce                            spontaneously). Complications:            No immediate  complications. Estimated Blood Loss:     Estimated blood loss was minimal. Impression:               - One 4 mm polyp in the ascending colon, removed                            with a cold snare. Resected and retrieved.                           - One 3 mm polyp in the sigmoid colon, removed with                            a cold snare. Resected and retrieved.                           - Medium-sized lipoma in the transverse colon.                           -  Diverticulosis in the sigmoid colon.                           - Non-bleeding internal hemorrhoids. Recommendation:           - Patient has a contact number available for                            emergencies. The signs and symptoms of potential                            delayed complications were discussed with the                            patient. Return to normal activities tomorrow.                            Written discharge instructions were provided to the                            patient.                           - Resume previous diet.                           - Continue present medications.                           - Await pathology results.                           - Repeat colonoscopy in 5 years for surveillance.                           - Return to GI clinic PRN. Doristine Locks, MD 06/09/2023 10:28:56 AM

## 2023-06-09 NOTE — Patient Instructions (Signed)
 Please read handouts provided. Continue present medications. Await pathology results. Resume previous diet. Repeat colonoscopy in 5 years for screening. Return to GI clinic as needed. Repeat upper endoscopy as needed for retreatment.   YOU HAD AN ENDOSCOPIC PROCEDURE TODAY AT THE [facility]:   Refer to the procedure report that was given to you for any specific questions about what was found during the examination.  If the procedure report does not answer your questions, please call your gastroenterologist to clarify.  If you requested that your care partner not be given the details of your procedure findings, then the procedure report has been included in a sealed envelope for you to review at your convenience later.  YOU SHOULD EXPECT: Some feelings of bloating in the abdomen. Passage of more gas than usual.  Walking can help get rid of the air that was put into your GI tract during the procedure and reduce the bloating. If you had a lower endoscopy (such as a colonoscopy or flexible sigmoidoscopy) you may notice spotting of blood in your stool or on the toilet paper. If you underwent a bowel prep for your procedure, you may not have a normal bowel movement for a few days.  Please Note:  You might notice some irritation and congestion in your nose or some drainage.  This is from the oxygen used during your procedure.  There is no need for concern and it should clear up in a day or so.  SYMPTOMS TO REPORT IMMEDIATELY:  Following lower endoscopy (colonoscopy or flexible sigmoidoscopy):  Excessive amounts of blood in the stool  Significant tenderness or worsening of abdominal pains  Swelling of the abdomen that is new, acute  Fever of 100F or higher  Following upper endoscopy (EGD)  Vomiting of blood or coffee ground material  New chest pain or pain under the shoulder blades  Painful or persistently difficult swallowing  New shortness of breath  Fever of 100F or  higher  Black, tarry-looking stools  For urgent or emergent issues, a gastroenterologist can be reached at any hour by calling [phone]. Do not use MyChart messaging for urgent concerns.    DIET:  We do recommend a small meal at first, but then you may proceed to your regular diet.  Drink plenty of fluids but you should avoid alcoholic beverages for 24 hours.  ACTIVITY:  You should plan to take it easy for the rest of today and you should NOT DRIVE or use heavy machinery until tomorrow (because of the sedation medicines used during the test).    FOLLOW UP: Our staff will call the number listed on your records the next business day following your procedure.  We will call around 7:15- 8:00 am to check on you and address any questions or concerns that you may have regarding the information given to you following your procedure. If we do not reach you, we will leave a message.     If any biopsies were taken you will be contacted by phone or by letter within the next 1-3 weeks.  Please call us at [phone] if you have not heard about the biopsies in 3 weeks.    SIGNATURES/CONFIDENTIALITY: You and/or your care partner have signed paperwork which will be entered into your electronic medical record.  These signatures attest to the fact that that the information above on your After Visit Summary has been reviewed and is understood.  Full responsibility of the confidentiality of this discharge information lies with  you and/or your care-partner.

## 2023-06-09 NOTE — Progress Notes (Signed)
 Report to PACU, RN, vss, BBS= Clear.

## 2023-06-10 ENCOUNTER — Telehealth: Payer: Self-pay | Admitting: *Deleted

## 2023-06-10 NOTE — Telephone Encounter (Signed)
  Follow up Call-     06/09/2023    9:31 AM  Call back number  Post procedure Call Back phone  # [phone]  Permission to leave phone message Yes     Patient questions:  Do you have a fever, pain , or abdominal swelling? No. Pain Score  0 *  Have you tolerated food without any problems? Yes.    Have you been able to return to your normal activities? Yes.    Do you have any questions about your discharge instructions: Diet   No. Medications  No. Follow up visit  No.  Do you have questions or concerns about your Care? No.  Actions: * If pain score is 4 or above: No action needed, pain <4.

## 2023-06-11 LAB — SURGICAL PATHOLOGY

## 2023-06-12 ENCOUNTER — Encounter: Payer: Self-pay | Admitting: Gastroenterology

## 2023-09-12 ENCOUNTER — Other Ambulatory Visit: Payer: Self-pay | Admitting: Family Medicine

## 2023-09-12 DIAGNOSIS — I10 Essential (primary) hypertension: Secondary | ICD-10-CM

## 2023-10-04 ENCOUNTER — Other Ambulatory Visit: Payer: Self-pay | Admitting: Cardiology

## 2023-10-09 ENCOUNTER — Other Ambulatory Visit: Payer: Self-pay | Admitting: Family Medicine

## 2023-10-09 ENCOUNTER — Encounter: Payer: Self-pay | Admitting: Family Medicine

## 2023-10-09 DIAGNOSIS — I10 Essential (primary) hypertension: Secondary | ICD-10-CM

## 2023-10-09 NOTE — Telephone Encounter (Signed)
 Letter sent.

## 2023-10-09 NOTE — Telephone Encounter (Signed)
 Stacks pt NTBS 30-d given 09/12/23

## 2023-10-11 ENCOUNTER — Other Ambulatory Visit: Payer: Self-pay | Admitting: Family Medicine

## 2023-10-11 DIAGNOSIS — I10 Essential (primary) hypertension: Secondary | ICD-10-CM

## 2023-10-14 NOTE — Progress Notes (Unsigned)
  Cardiology Office Note:   Date:  10/15/2023  ID:  Gregg Morgan, DOB 25-Jan-1961, MRN [id number] PCP: Zollie Lowers, MD  [facility] Providers Cardiologist:  Lynwood Schilling, MD {  History of Present Illness:   Gregg Morgan is a 63 y.o. male who presents for evaluation of HCM.  He is referred by Zollie Lowers, MD    he had a history of hepatitis in the past.  Cardiac catheterization in 2011 demonstrated LAD mid 50 to 60% stenosis.  This was after the first diagonal.  The right coronary artery had 30 to 40% stenosis.  He had a heart murmur and was found to have HCM on echo.  MRI demonstrated ASH with 17 mm septum.  There was only patchy LGE.  He had no arrhythmias on a 3 day monitor.     Since I last saw him he started using CPAP for sleep apnea and he does feel better.  He works a vigorous job. The patient denies any new symptoms such as chest discomfort, neck or arm discomfort. There has been no new shortness of breath, PND or orthopnea. There have been no reported palpitations, presyncope or syncope.   ROS: As stated in the HPI and negative for all other systems.  Studies Reviewed:    EKG:    Sinus rhythm, rate 62, left bundle branch block, no acute ST-T wave changes.  Risk Assessment/Calculations:       Physical Exam:   VS:  BP (!) 146/90   Pulse 60   Ht 5' 6 (1.676 m)   Wt 194 lb (88 kg)   BMI 31.31 kg/m    Wt Readings from Last 3 Encounters:  10/15/23 194 lb (88 kg)  06/09/23 194 lb (88 kg)  05/28/23 197 lb 9.6 oz (89.6 kg)     GEN: Well nourished, well developed in no acute distress NECK: No JVD; No carotid bruits CARDIAC: RRR, 3 out of 6 apical systolic murmur radiating out the aortic outflow tract and increasing slightly with the strain phase of Valsalva, no diastolic murmurs, rubs, gallops RESPIRATORY:  Clear to auscultation without rales, wheezing or rhonchi  ABDOMEN: Soft, non-tender, non-distended EXTREMITIES:  No edema; No deformity    ASSESSMENT AND PLAN:   HCM:   He has no high risk features.  He has no symptoms.  I think maybe I have convinced him to have genetic testing and we have also talked about screening his daughter with or without the genetic testing.  She is not 27.  Dyslipidemia: His LDL is 73.  No change in therapy.   HTN: His blood pressure is elevated but this is unusual.  They are going to keep a blood pressure diary.   CAD:  The patient has previous nonobstructive disease and no new symptoms.  He will continue with risk reduction.  Sleep apnea:  He has CPAP treated by Dr. Burnard.     Follow up with me in one year.   Signed, Lynwood Schilling, MD

## 2023-10-15 ENCOUNTER — Ambulatory Visit: Admitting: Cardiology

## 2023-10-15 ENCOUNTER — Encounter: Payer: Self-pay | Admitting: Cardiology

## 2023-10-15 VITALS — BP 146/90 | HR 60 | Ht 66.0 in | Wt 194.0 lb

## 2023-10-15 DIAGNOSIS — E785 Hyperlipidemia, unspecified: Secondary | ICD-10-CM | POA: Diagnosis not present

## 2023-10-15 DIAGNOSIS — I422 Other hypertrophic cardiomyopathy: Secondary | ICD-10-CM | POA: Diagnosis not present

## 2023-10-15 DIAGNOSIS — I25119 Atherosclerotic heart disease of native coronary artery with unspecified angina pectoris: Secondary | ICD-10-CM | POA: Diagnosis not present

## 2023-10-15 NOTE — Patient Instructions (Addendum)
 Medication Instructions:   Continue all current medications.   Labwork:  none  Testing/Procedures:  none  Follow-Up:  Your physician wants you to follow up in:  1 year.  You should receive a recall letter in the mail about 2 months prior to the time you are due.  If you don't receive this, please call our office to schedule your follow up appointment.      Any Other Special Instructions Will Be Listed Below (If Applicable).  You have been referred to:  Genetics    If you need a refill on your cardiac medications before your next appointment, please call your pharmacy.

## 2023-10-23 ENCOUNTER — Ambulatory Visit: Admitting: Family Medicine

## 2023-10-23 ENCOUNTER — Encounter: Payer: Self-pay | Admitting: Family Medicine

## 2023-10-23 VITALS — BP 137/80 | HR 61 | Temp 97.5°F | Ht 66.0 in | Wt 190.0 lb

## 2023-10-23 DIAGNOSIS — K219 Gastro-esophageal reflux disease without esophagitis: Secondary | ICD-10-CM

## 2023-10-23 DIAGNOSIS — Z23 Encounter for immunization: Secondary | ICD-10-CM | POA: Diagnosis not present

## 2023-10-23 DIAGNOSIS — I10 Essential (primary) hypertension: Secondary | ICD-10-CM

## 2023-10-23 DIAGNOSIS — E782 Mixed hyperlipidemia: Secondary | ICD-10-CM | POA: Diagnosis not present

## 2023-10-23 MED ORDER — LISINOPRIL 20 MG PO TABS
20.0000 mg | ORAL_TABLET | Freq: Every day | ORAL | 0 refills | Status: DC
Start: 2023-10-23 — End: 2023-11-20

## 2023-10-23 MED ORDER — ROSUVASTATIN CALCIUM 40 MG PO TABS: 40.0000 mg | ORAL_TABLET | Freq: Every day | ORAL | 3 refills | Status: AC

## 2023-10-23 MED ORDER — PANTOPRAZOLE SODIUM 40 MG PO TBEC: 40.0000 mg | DELAYED_RELEASE_TABLET | Freq: Two times a day (BID) | ORAL | 3 refills | Status: AC

## 2023-10-23 NOTE — Progress Notes (Signed)
 Subjective:  Patient ID: Gregg Morgan,  male    DOB: 1960-12-30  Age: 63 y.o.    CC: Medication Refill (pended)   HPI Ramere Downs Porto presents for  follow-up of hypertension. Patient has no history of headache chest pain or shortness of breath or recent cough. Patient also denies symptoms of TIA such as numbness weakness lateralizing. Patient denies side effects from medication. States taking it regularly.  Patient also  in for follow-up of elevated cholesterol. Doing well without complaints on current medication. Denies side effects  including myalgia and arthralgia and nausea. Also in today for liver function testing. Currently no chest pain, shortness of breath or other cardiovascular related symptoms noted.  Follow-up of Patient in for follow-up of GERD. Currently asymptomatic taking  PPI daily. There is no chest pain or heartburn. No hematemesis and no melena. No dysphagia or choking. Onset is remote. Progression is stable. Complicating factors, none.    History Bonham has a past medical history of Arthritis, Coronary artery disease, GERD (gastroesophageal reflux disease), Heart murmur, Hyperlipidemia, Hypertension, Pericarditis, and Sleep apnea.   He has a past surgical history that includes Finger surgery; Cardiac catheterization; ORIF finger fracture (08/03/2011); Upper gastrointestinal endoscopy (2012); and Colonoscopy.   His family history includes Colon cancer in his father; Hypertension in his mother; Other in his mother; Thyroid  disease in his mother.He reports that he quit smoking about 7 years ago. His smoking use included cigarettes. He started smoking about 27 years ago. He has a 10 pack-year smoking history. He has never used smokeless tobacco. He reports current alcohol use of about 2.0 standard drinks of alcohol per week. He reports that he does not use drugs.  Current Outpatient Medications on File Prior to Visit  Medication Sig Dispense Refill    acetaminophen  (TYLENOL ) 500 MG tablet Take 500 mg by mouth every 6 (six) hours as needed.     aspirin 81 MG tablet Take 81 mg by mouth at bedtime.     ezetimibe  (ZETIA ) 10 MG tablet TAKE 1 TABLET BY MOUTH EVERY DAY 90 tablet 2   Cholecalciferol (VITAMIN D ) 2000 UNITS CAPS Take 1 capsule by mouth at bedtime. (Patient not taking: Reported on 10/15/2023)     No current facility-administered medications on file prior to visit.    ROS Review of Systems  Constitutional: Negative.   HENT: Negative.    Eyes:  Negative for visual disturbance.  Respiratory:  Negative for cough and shortness of breath.   Cardiovascular:  Negative for chest pain and leg swelling.  Gastrointestinal:  Negative for abdominal pain, diarrhea, nausea and vomiting.  Genitourinary:  Negative for difficulty urinating.  Musculoskeletal:  Negative for arthralgias and myalgias.  Skin:  Negative for rash.  Neurological:  Negative for headaches.  Psychiatric/Behavioral:  Negative for sleep disturbance.     Objective:  BP 137/80   Pulse 61   Temp (!) 97.5 F (36.4 C)   Ht 5' 6 (1.676 m)   Wt 190 lb (86.2 kg)   SpO2 96%   BMI 30.67 kg/m   BP Readings from Last 3 Encounters:  10/23/23 137/80  10/15/23 (!) 146/90  06/09/23 128/76    Wt Readings from Last 3 Encounters:  10/23/23 190 lb (86.2 kg)  10/15/23 194 lb (88 kg)  06/09/23 194 lb (88 kg)    Lab Results  Component Value Date   HGBA1C  10/09/2009    5.6 (NOTE)  According to the ADA Clinical Practice Recommendations for 2011, when HbA1c is used as a screening test:   >=6.5%   Diagnostic of Diabetes Mellitus           (if abnormal result  is confirmed)  5.7-6.4%   Increased risk of developing Diabetes Mellitus  References:Diagnosis and Classification of Diabetes Mellitus,Diabetes Care,2011,34(Suppl 1):S62-S69 and Standards of Medical Care in         Diabetes - 2011,Diabetes Care,2011,34   (Suppl 1):S11-S61.    Physical Exam Vitals reviewed.  Constitutional:      Appearance: He is well-developed.  HENT:     Head: Normocephalic and atraumatic.     Right Ear: External ear normal.     Left Ear: External ear normal.     Mouth/Throat:     Pharynx: No oropharyngeal exudate or posterior oropharyngeal erythema.  Eyes:     Pupils: Pupils are equal, round, and reactive to light.  Cardiovascular:     Rate and Rhythm: Normal rate and regular rhythm.     Heart sounds: No murmur heard. Pulmonary:     Effort: No respiratory distress.     Breath sounds: Normal breath sounds.  Musculoskeletal:     Cervical back: Normal range of motion and neck supple.  Neurological:     Mental Status: He is alert and oriented to person, place, and time.         Assessment & Plan:  Mixed hyperlipidemia -     CBC with Differential/Platelet -     CMP14+EGFR -     Lipid panel  Essential hypertension -     Lisinopril ; Take 1 tablet (20 mg total) by mouth daily. **NEEDS TO BE SEEN BEFORE NEXT REFILL**  Dispense: 30 tablet; Refill: 0 -     CBC with Differential/Platelet -     CMP14+EGFR  Gastroesophageal reflux disease without esophagitis -     Pantoprazole  Sodium; Take 1 tablet (40 mg total) by mouth 2 (two) times daily.  Dispense: 180 tablet; Refill: 3 -     CBC with Differential/Platelet -     CMP14+EGFR  Other orders -     Rosuvastatin  Calcium ; Take 1 tablet (40 mg total) by mouth daily.  Dispense: 90 tablet; Refill: 3    Follow-up: Return in about 6 months (around 04/24/2024) for [facility] physical.  Butler Der, M.D.

## 2023-10-24 LAB — LIPID PANEL
Chol/HDL Ratio: 2.3 ratio (ref 0.0–5.0)
Cholesterol, Total: 138 mg/dL (ref 100–199)
HDL: 61 mg/dL (ref >39)
LDL Chol Calc (NIH): 59 mg/dL (ref 0–99)
Triglycerides: 100 mg/dL (ref 0–149)
VLDL Cholesterol Cal: 18 mg/dL (ref 5–40)

## 2023-10-24 LAB — CBC WITH DIFFERENTIAL/PLATELET
Basophils Absolute: 0.1 x10E3/uL (ref 0.0–0.2)
Basos: 2 %
EOS (ABSOLUTE): 0.1 x10E3/uL (ref 0.0–0.4)
Eos: 1 %
Hematocrit: 44.8 % (ref 37.5–51.0)
Hemoglobin: 15.8 g/dL (ref 13.0–17.7)
Immature Grans (Abs): 0 x10E3/uL (ref 0.0–0.1)
Immature Granulocytes: 0 %
Lymphocytes Absolute: 1.7 x10E3/uL (ref 0.7–3.1)
Lymphs: 29 %
MCH: 32.5 pg (ref 26.6–33.0)
MCHC: 35.3 g/dL (ref 31.5–35.7)
MCV: 92 fL (ref 79–97)
Monocytes Absolute: 0.5 x10E3/uL (ref 0.1–0.9)
Monocytes: 8 %
Neutrophils Absolute: 3.5 x10E3/uL (ref 1.4–7.0)
Neutrophils: 60 %
Platelets: 217 x10E3/uL (ref 150–450)
RBC: 4.86 x10E6/uL (ref 4.14–5.80)
RDW: 12.5 % (ref 11.6–15.4)
WBC: 5.8 x10E3/uL (ref 3.4–10.8)

## 2023-10-24 LAB — CMP14+EGFR
ALT: 59 IU/L — ABNORMAL HIGH (ref 0–44)
AST: 42 IU/L — ABNORMAL HIGH (ref 0–40)
Albumin: 4.7 g/dL (ref 3.9–4.9)
Alkaline Phosphatase: 86 IU/L (ref 44–121)
BUN/Creatinine Ratio: 9 — ABNORMAL LOW (ref 10–24)
BUN: 8 mg/dL (ref 8–27)
Bilirubin Total: 0.7 mg/dL (ref 0.0–1.2)
CO2: 21 mmol/L (ref 20–29)
Calcium: 9.4 mg/dL (ref 8.6–10.2)
Chloride: 102 mmol/L (ref 96–106)
Creatinine, Ser: 0.93 mg/dL (ref 0.76–1.27)
Globulin, Total: 2.3 g/dL (ref 1.5–4.5)
Glucose: 91 mg/dL (ref 70–99)
Potassium: 4.1 mmol/L (ref 3.5–5.2)
Sodium: 137 mmol/L (ref 134–144)
Total Protein: 7 g/dL (ref 6.0–8.5)
eGFR: 92 mL/min/1.73 (ref >59)

## 2023-10-25 ENCOUNTER — Ambulatory Visit: Payer: Self-pay | Admitting: Family Medicine

## 2023-10-25 DIAGNOSIS — R748 Abnormal levels of other serum enzymes: Secondary | ICD-10-CM

## 2023-10-28 NOTE — Addendum Note (Signed)
 Addended by: Barton Want G on: 10/28/2023 04:47 PM   Modules accepted: Orders

## 2023-10-30 ENCOUNTER — Ambulatory Visit: Admitting: Family Medicine

## 2023-11-20 ENCOUNTER — Encounter: Payer: Self-pay | Admitting: Family Medicine

## 2023-11-20 ENCOUNTER — Ambulatory Visit: Admitting: Family Medicine

## 2023-11-20 VITALS — BP 139/72 | HR 56 | Temp 97.8°F | Ht 66.0 in | Wt 194.0 lb

## 2023-11-20 DIAGNOSIS — E559 Vitamin D deficiency, unspecified: Secondary | ICD-10-CM

## 2023-11-20 DIAGNOSIS — I10 Essential (primary) hypertension: Secondary | ICD-10-CM

## 2023-11-20 DIAGNOSIS — E782 Mixed hyperlipidemia: Secondary | ICD-10-CM

## 2023-11-20 DIAGNOSIS — R748 Abnormal levels of other serum enzymes: Secondary | ICD-10-CM | POA: Diagnosis not present

## 2023-11-20 DIAGNOSIS — Z0000 Encounter for general adult medical examination without abnormal findings: Secondary | ICD-10-CM

## 2023-11-20 DIAGNOSIS — Z125 Encounter for screening for malignant neoplasm of prostate: Secondary | ICD-10-CM

## 2023-11-20 DIAGNOSIS — Z0001 Encounter for general adult medical examination with abnormal findings: Secondary | ICD-10-CM | POA: Diagnosis not present

## 2023-11-20 DIAGNOSIS — N500 Atrophy of testis: Secondary | ICD-10-CM

## 2023-11-20 DIAGNOSIS — R011 Cardiac murmur, unspecified: Secondary | ICD-10-CM

## 2023-11-20 LAB — URINALYSIS
Bilirubin, UA: NEGATIVE
Glucose, UA: NEGATIVE
Ketones, UA: NEGATIVE
Leukocytes,UA: NEGATIVE
Nitrite, UA: NEGATIVE
Protein,UA: NEGATIVE
Specific Gravity, UA: 1.01 (ref 1.005–1.030)
Urobilinogen, Ur: 0.2 mg/dL (ref 0.2–1.0)
pH, UA: 7 (ref 5.0–7.5)

## 2023-11-20 MED ORDER — LISINOPRIL 20 MG PO TABS: 20.0000 mg | ORAL_TABLET | Freq: Every day | ORAL | 3 refills | Status: AC

## 2023-11-20 NOTE — Progress Notes (Signed)
 Subjective:  Patient ID: Gregg Morgan, male    DOB: 01/09/1961  Age: 63 y.o. MRN: [id number]  CC: Annual Exam   HPI Gregg Morgan presents for complete physical exam.  No new concerns since last visit.     11/20/2023    3:09 PM 12/11/2022    2:40 PM 07/10/2022    1:47 PM  Depression screen PHQ 2/9  Decreased Interest 0 0 0  Down, Depressed, Hopeless 0 0 0  PHQ - 2 Score 0 0 0    History Gregg Morgan has a past medical history of Arthritis, Coronary artery disease, GERD (gastroesophageal reflux disease), Heart murmur, Hyperlipidemia, Hypertension, Pericarditis, and Sleep apnea.   Gregg Morgan has a past surgical history that includes Finger surgery; Cardiac catheterization; ORIF finger fracture (08/03/2011); Upper gastrointestinal endoscopy (2012); and Colonoscopy.   His family history includes Colon cancer in his father; Hypertension in his mother; Other in his mother; Thyroid  disease in his mother.Gregg Morgan reports that Gregg Morgan quit smoking about 7 years ago. His smoking use included cigarettes. Gregg Morgan started smoking about 27 years ago. Gregg Morgan has a 10 pack-year smoking history. Gregg Morgan has never used smokeless tobacco. Gregg Morgan reports current alcohol use of about 2.0 standard drinks of alcohol per week. Gregg Morgan reports that Gregg Morgan does not use drugs.    ROS Review of Systems  Constitutional:  Negative for activity change, fatigue and unexpected weight change.  HENT:  Negative for congestion, ear pain, hearing loss, postnasal drip and trouble swallowing.   Eyes:  Negative for pain and visual disturbance.  Respiratory:  Negative for cough, chest tightness and shortness of breath.   Cardiovascular:  Negative for chest pain, palpitations and leg swelling.  Gastrointestinal:  Negative for abdominal distention, abdominal pain, blood in stool, constipation, diarrhea, nausea and vomiting.  Endocrine: Negative for cold intolerance, heat intolerance and polydipsia.  Genitourinary:  Negative for difficulty urinating, dysuria,  flank pain, frequency and urgency.  Musculoskeletal:  Negative for arthralgias and joint swelling.  Skin:  Negative for color change, rash and wound.  Neurological:  Negative for dizziness, syncope, speech difficulty, weakness, light-headedness, numbness and headaches.  Hematological:  Does not bruise/bleed easily.  Psychiatric/Behavioral:  Negative for confusion, decreased concentration, dysphoric mood and sleep disturbance. The patient is not nervous/anxious.     Objective:  BP 139/72   Pulse (!) 56   Temp 97.8 F (36.6 C)   Ht 5' 6 (1.676 m)   Wt 194 lb (88 kg)   SpO2 98%   BMI 31.31 kg/m   BP Readings from Last 3 Encounters:  11/20/23 139/72  10/23/23 137/80  10/15/23 (!) 146/90    Wt Readings from Last 3 Encounters:  11/20/23 194 lb (88 kg)  10/23/23 190 lb (86.2 kg)  10/15/23 194 lb (88 kg)     Physical Exam Constitutional:      Appearance: Gregg Morgan is well-developed.  HENT:     Head: Normocephalic and atraumatic.  Eyes:     Pupils: Pupils are equal, round, and reactive to light.  Neck:     Thyroid : No thyromegaly.     Trachea: No tracheal deviation.  Cardiovascular:     Rate and Rhythm: Normal rate and regular rhythm.     Heart sounds: Murmur (2/6 systolic) heard.     No friction rub. No gallop.  Pulmonary:     Breath sounds: Normal breath sounds. No wheezing or rales.  Abdominal:     General: Bowel sounds are normal. There is no distension.  Palpations: Abdomen is soft. There is no mass.     Tenderness: There is no abdominal tenderness.     Hernia: There is no hernia in the left inguinal area.  Genitourinary:    Penis: Normal.      Testes: Normal.        Right: Mass, tenderness or swelling not present.     Comments: L3eft testicle atrophic  Musculoskeletal:        General: Normal range of motion.     Cervical back: Normal range of motion.  Lymphadenopathy:     Cervical: No cervical adenopathy.  Skin:    General: Skin is warm and dry.   Neurological:     Mental Status: Gregg Morgan is alert and oriented to person, place, and time.      Assessment & Plan:  Well adult exam -     PSA -     VITAMIN D  25 Hydroxy (Vit-D Deficiency, Fractures) -     Urinalysis  Essential hypertension -     Urinalysis -     Lisinopril ; Take 1 tablet (20 mg total) by mouth daily.  Dispense: 90 tablet; Refill: 3  Vitamin D  deficiency -     VITAMIN D  25 Hydroxy (Vit-D Deficiency, Fractures)  Screening for prostate cancer -     PSA  Abnormal liver enzymes -     Hepatic function panel  Murmur  Mixed hyperlipidemia  Atrophic testicle   The murmur is being monitored annually by cardiology.  The hyperlipidemia is under treatment with rosuvastatin  and ezetimibe .  The atrophic testicle is clogged we monitored but at this point is asymptomatic and has no significance for the patient.  Follow-up: Return in about 1 year (around 11/19/2024) for [facility] physical.  Butler Der, M.D.

## 2023-11-21 LAB — HEPATIC FUNCTION PANEL
ALT: 42 IU/L (ref 0–44)
AST: 27 IU/L (ref 0–40)
Albumin: 4.8 g/dL (ref 3.9–4.9)
Alkaline Phosphatase: 79 IU/L (ref 44–121)
Bilirubin Total: 0.6 mg/dL (ref 0.0–1.2)
Bilirubin, Direct: 0.2 mg/dL (ref 0.00–0.40)
Total Protein: 7.2 g/dL (ref 6.0–8.5)

## 2023-11-21 LAB — PSA: Prostate Specific Ag, Serum: 0.9 ng/mL (ref 0.0–4.0)

## 2023-11-21 LAB — VITAMIN D 25 HYDROXY (VIT D DEFICIENCY, FRACTURES): Vit D, 25-Hydroxy: 32.1 ng/mL (ref 30.0–100.0)

## 2023-11-23 ENCOUNTER — Encounter: Payer: Self-pay | Admitting: Family Medicine

## 2023-11-28 ENCOUNTER — Other Ambulatory Visit

## 2023-12-16 ENCOUNTER — Encounter: Admitting: Family Medicine

## 2024-04-22 ENCOUNTER — Encounter: Admitting: Family Medicine

## 2024-11-22 ENCOUNTER — Encounter: Payer: Self-pay | Admitting: Family Medicine
